# Patient Record
Sex: Female | Born: 1935 | Race: Black or African American | Hispanic: No | Smoking: Never smoker
Health system: Southern US, Community
[De-identification: ages and names within clinical notes are randomized; demographics above are authoritative.]

## PROBLEM LIST (undated history)

## (undated) DIAGNOSIS — R079 Chest pain, unspecified: Secondary | ICD-10-CM

## (undated) DIAGNOSIS — R0609 Other forms of dyspnea: Secondary | ICD-10-CM

## (undated) DIAGNOSIS — G459 Transient cerebral ischemic attack, unspecified: Secondary | ICD-10-CM

## (undated) DIAGNOSIS — I10 Essential (primary) hypertension: Secondary | ICD-10-CM

## (undated) DIAGNOSIS — Z8639 Personal history of other endocrine, nutritional and metabolic disease: Secondary | ICD-10-CM

## (undated) DIAGNOSIS — E119 Type 2 diabetes mellitus without complications: Secondary | ICD-10-CM

## (undated) DIAGNOSIS — K219 Gastro-esophageal reflux disease without esophagitis: Secondary | ICD-10-CM

## (undated) DIAGNOSIS — E7800 Pure hypercholesterolemia, unspecified: Secondary | ICD-10-CM

## (undated) HISTORY — PX: EYE SURGERY: SHX253

## (undated) HISTORY — DX: Pure hypercholesterolemia, unspecified: E78.00

## (undated) HISTORY — PX: FOOT SURGERY: SHX648

## (undated) HISTORY — DX: Personal history of other endocrine, nutritional and metabolic disease: Z86.39

---

## 2004-07-13 ENCOUNTER — Emergency Department (HOSPITAL_COMMUNITY): Admission: EM | Admit: 2004-07-13 | Discharge: 2004-07-13 | Payer: Self-pay | Admitting: Family Medicine

## 2013-12-06 ENCOUNTER — Emergency Department (HOSPITAL_COMMUNITY): Payer: Medicare Other

## 2013-12-06 ENCOUNTER — Encounter (HOSPITAL_COMMUNITY): Payer: Self-pay | Admitting: Emergency Medicine

## 2013-12-06 ENCOUNTER — Observation Stay (HOSPITAL_COMMUNITY): Payer: Medicare Other

## 2013-12-06 ENCOUNTER — Observation Stay (HOSPITAL_COMMUNITY)
Admission: EM | Admit: 2013-12-06 | Discharge: 2013-12-06 | Disposition: A | Discharge status: Home / Self Care | Payer: Medicare Other | Attending: Internal Medicine | Admitting: Internal Medicine

## 2013-12-06 DIAGNOSIS — I519 Heart disease, unspecified: Secondary | ICD-10-CM

## 2013-12-06 DIAGNOSIS — R0600 Dyspnea, unspecified: Secondary | ICD-10-CM

## 2013-12-06 DIAGNOSIS — R079 Chest pain, unspecified: Secondary | ICD-10-CM

## 2013-12-06 DIAGNOSIS — K219 Gastro-esophageal reflux disease without esophagitis: Secondary | ICD-10-CM | POA: Diagnosis not present

## 2013-12-06 DIAGNOSIS — I10 Essential (primary) hypertension: Secondary | ICD-10-CM | POA: Diagnosis not present

## 2013-12-06 DIAGNOSIS — R0789 Other chest pain: Secondary | ICD-10-CM | POA: Diagnosis not present

## 2013-12-06 DIAGNOSIS — E119 Type 2 diabetes mellitus without complications: Secondary | ICD-10-CM | POA: Diagnosis not present

## 2013-12-06 DIAGNOSIS — R110 Nausea: Secondary | ICD-10-CM | POA: Insufficient documentation

## 2013-12-06 DIAGNOSIS — Z8673 Personal history of transient ischemic attack (TIA), and cerebral infarction without residual deficits: Secondary | ICD-10-CM | POA: Insufficient documentation

## 2013-12-06 DIAGNOSIS — R072 Precordial pain: Secondary | ICD-10-CM | POA: Diagnosis present

## 2013-12-06 DIAGNOSIS — R0609 Other forms of dyspnea: Secondary | ICD-10-CM | POA: Diagnosis present

## 2013-12-06 HISTORY — DX: Chest pain, unspecified: R07.9

## 2013-12-06 HISTORY — DX: Other forms of dyspnea: R06.09

## 2013-12-06 HISTORY — DX: Type 2 diabetes mellitus without complications: E11.9

## 2013-12-06 HISTORY — DX: Gastro-esophageal reflux disease without esophagitis: K21.9

## 2013-12-06 HISTORY — DX: Transient cerebral ischemic attack, unspecified: G45.9

## 2013-12-06 HISTORY — DX: Essential (primary) hypertension: I10

## 2013-12-06 HISTORY — DX: Dyspnea, unspecified: R06.00

## 2013-12-06 LAB — CBC
HCT: 33.2 % — ABNORMAL LOW (ref 36.0–46.0)
HCT: 37.5 % (ref 36.0–46.0)
Hemoglobin: 10.7 g/dL — ABNORMAL LOW (ref 12.0–15.0)
Hemoglobin: 12.1 g/dL (ref 12.0–15.0)
MCH: 27.7 pg (ref 26.0–34.0)
MCH: 27.9 pg (ref 26.0–34.0)
MCHC: 32.2 g/dL (ref 30.0–36.0)
MCHC: 32.3 g/dL (ref 30.0–36.0)
MCV: 86 fL (ref 78.0–100.0)
MCV: 86.6 fL (ref 78.0–100.0)
PLATELETS: 248 10*3/uL (ref 150–400)
Platelets: 253 K/uL (ref 150–400)
RBC: 3.86 MIL/uL — ABNORMAL LOW (ref 3.87–5.11)
RBC: 4.33 MIL/uL (ref 3.87–5.11)
RDW: 14.8 % (ref 11.5–15.5)
RDW: 14.9 % (ref 11.5–15.5)
WBC: 6.3 10*3/uL (ref 4.0–10.5)
WBC: 5.4 K/uL (ref 4.0–10.5)

## 2013-12-06 LAB — TROPONIN I
Troponin I: <0.3 ng/mL (ref <0.30)
Troponin I: <0.3 ng/mL
Troponin I: <0.3 ng/mL
Troponin I: <0.3 ng/mL (ref <0.30)

## 2013-12-06 LAB — LIPID PANEL
Cholesterol: 154 mg/dL (ref 0–200)
HDL: 45 mg/dL
LDL Cholesterol: 79 mg/dL (ref 0–99)
Total CHOL/HDL Ratio: 3.4 ratio
Triglycerides: 148 mg/dL
VLDL: 30 mg/dL (ref 0–40)

## 2013-12-06 LAB — BASIC METABOLIC PANEL
ANION GAP: 12 (ref 5–15)
BUN: 20 mg/dL (ref 6–23)
CALCIUM: 9.3 mg/dL (ref 8.4–10.5)
CO2: 25 mEq/L (ref 19–32)
CREATININE: 1.05 mg/dL (ref 0.50–1.10)
Chloride: 106 mEq/L (ref 96–112)
GFR calc Af Amer: 57 mL/min — ABNORMAL LOW (ref >90)
GFR calc non Af Amer: 50 mL/min — ABNORMAL LOW (ref >90)
Glucose, Bld: 96 mg/dL (ref 70–99)
Potassium: 4.1 mEq/L (ref 3.7–5.3)
SODIUM: 143 meq/L (ref 137–147)

## 2013-12-06 LAB — CREATININE, SERUM
Creatinine, Ser: 0.92 mg/dL (ref 0.50–1.10)
GFR calc non Af Amer: 58 mL/min — ABNORMAL LOW (ref >90)
GFR, EST AFRICAN AMERICAN: 67 mL/min — AB (ref >90)

## 2013-12-06 LAB — I-STAT TROPONIN, ED: Troponin i, poc: 0 ng/mL (ref 0.00–0.08)

## 2013-12-06 LAB — HEMOGLOBIN A1C
Hgb A1c MFr Bld: 5.8 % — ABNORMAL HIGH (ref <5.7)
Mean Plasma Glucose: 120 mg/dL — ABNORMAL HIGH (ref <117)

## 2013-12-06 LAB — PRO B NATRIURETIC PEPTIDE
PRO B NATRI PEPTIDE: 55.5 pg/mL (ref 0–450)
Pro B Natriuretic peptide (BNP): 46 pg/mL (ref 0–450)

## 2013-12-06 MED ORDER — ASPIRIN 325 MG PO TABS
325.0000 mg | ORAL_TABLET | ORAL | Status: AC
  Administered 2013-12-06: 325 mg via ORAL
  Filled 2013-12-06: qty 1

## 2013-12-06 MED ORDER — TECHNETIUM TC 99M SESTAMIBI GENERIC - CARDIOLITE
30.0000 | Freq: Once | INTRAVENOUS | Status: AC | PRN
  Administered 2013-12-06: 30 via INTRAVENOUS

## 2013-12-06 MED ORDER — TECHNETIUM TC 99M SESTAMIBI GENERIC - CARDIOLITE
10.0000 | Freq: Once | INTRAVENOUS | Status: AC | PRN
  Administered 2013-12-06: 10 via INTRAVENOUS

## 2013-12-06 MED ORDER — TRIAMTERENE-HCTZ 50-25 MG PO CAPS: 1.0000 | ORAL_CAPSULE | ORAL | Status: DC

## 2013-12-06 MED ORDER — ASPIRIN EC 81 MG PO TBEC
81.0000 mg | DELAYED_RELEASE_TABLET | Freq: Every day | ORAL | Status: DC
  Administered 2013-12-06: 81 mg via ORAL
  Filled 2013-12-06: qty 1

## 2013-12-06 MED ORDER — OMEPRAZOLE 40 MG PO CPDR: 40.0000 mg | DELAYED_RELEASE_CAPSULE | Freq: Two times a day (BID) | ORAL | Status: DC

## 2013-12-06 MED ORDER — ONDANSETRON HCL 4 MG/2ML IJ SOLN
4.0000 mg | Freq: Once | INTRAMUSCULAR | Status: AC
Start: 2013-12-06 — End: 2013-12-06
  Administered 2013-12-06: 4 mg via INTRAVENOUS
  Filled 2013-12-06: qty 2

## 2013-12-06 MED ORDER — ACETAMINOPHEN 325 MG PO TABS: 650.0000 mg | ORAL_TABLET | ORAL | Status: DC | PRN

## 2013-12-06 MED ORDER — REGADENOSON 0.4 MG/5ML IV SOLN
0.4000 mg | Freq: Once | INTRAVENOUS | Status: AC
  Administered 2013-12-06: 0.4 mg via INTRAVENOUS
  Filled 2013-12-06: qty 5

## 2013-12-06 MED ORDER — NITROGLYCERIN 2 % TD OINT
0.5000 [in_us] | TOPICAL_OINTMENT | TRANSDERMAL | Status: AC
  Administered 2013-12-06: 0.5 [in_us] via TOPICAL
  Filled 2013-12-06: qty 1

## 2013-12-06 MED ORDER — NITROGLYCERIN 0.3 MG SL SUBL: 0.3000 mg | SUBLINGUAL_TABLET | SUBLINGUAL | Status: DC | PRN

## 2013-12-06 MED ORDER — NITROGLYCERIN 0.4 MG SL SUBL
0.4000 mg | SUBLINGUAL_TABLET | SUBLINGUAL | Status: DC | PRN
  Administered 2013-12-06 (×2): 0.4 mg via SUBLINGUAL
  Filled 2013-12-06: qty 1

## 2013-12-06 MED ORDER — METOPROLOL SUCCINATE ER 50 MG PO TB24: 50.0000 mg | ORAL_TABLET | Freq: Every day | ORAL | Status: DC

## 2013-12-06 MED ORDER — HEPARIN SODIUM (PORCINE) 5000 UNIT/ML IJ SOLN
5000.0000 [IU] | Freq: Three times a day (TID) | INTRAMUSCULAR | Status: DC
  Administered 2013-12-06: 5000 [IU] via SUBCUTANEOUS
  Filled 2013-12-06 (×5): qty 1

## 2013-12-06 MED ORDER — SODIUM CHLORIDE 0.9 % IV BOLUS (SEPSIS)
500.0000 mL | INTRAVENOUS | Status: AC
  Administered 2013-12-06: 500 mL via INTRAVENOUS

## 2013-12-06 MED ORDER — ATORVASTATIN CALCIUM 40 MG PO TABS
40.0000 mg | ORAL_TABLET | Freq: Every day | ORAL | Status: DC
  Filled 2013-12-06: qty 1

## 2013-12-06 MED ORDER — BIOTENE DRY MOUTH MT LIQD
15.0000 mL | Freq: Two times a day (BID) | OROMUCOSAL | Status: DC
  Administered 2013-12-06: 15 mL via OROMUCOSAL

## 2013-12-06 MED ORDER — TRIAMTERENE 50 MG PO CAPS
50.0000 mg | ORAL_CAPSULE | Freq: Every day | ORAL | Status: DC
  Administered 2013-12-06: 50 mg via ORAL
  Filled 2013-12-06 (×2): qty 1

## 2013-12-06 MED ORDER — ONDANSETRON HCL 4 MG/2ML IJ SOLN: 4.0000 mg | Freq: Four times a day (QID) | INTRAMUSCULAR | Status: DC | PRN

## 2013-12-06 MED ORDER — METOPROLOL SUCCINATE ER 50 MG PO TB24
50.0000 mg | ORAL_TABLET | Freq: Every day | ORAL | Status: DC
  Administered 2013-12-06: 50 mg via ORAL
  Filled 2013-12-06: qty 1

## 2013-12-06 MED ORDER — REGADENOSON 0.4 MG/5ML IV SOLN
INTRAVENOUS | Status: AC
  Administered 2013-12-06: 0.4 mg via INTRAVENOUS
  Filled 2013-12-06: qty 5

## 2013-12-06 MED ORDER — HYDROCHLOROTHIAZIDE 25 MG PO TABS
25.0000 mg | ORAL_TABLET | Freq: Every day | ORAL | Status: DC
  Administered 2013-12-06: 25 mg via ORAL
  Filled 2013-12-06: qty 1

## 2013-12-06 MED ORDER — PANTOPRAZOLE SODIUM 40 MG PO TBEC
40.0000 mg | DELAYED_RELEASE_TABLET | Freq: Every day | ORAL | Status: DC
  Administered 2013-12-06: 40 mg via ORAL
  Filled 2013-12-06: qty 1

## 2013-12-06 MED ORDER — SODIUM CHLORIDE 0.9 % IJ SOLN
80.0000 mg | INTRAVENOUS | Status: AC
  Administered 2013-12-06: 80 mg via INTRAVENOUS

## 2013-12-06 NOTE — Progress Notes (Signed)
Pt is ready for DC home with daughter. Pt reports she understands changes to medications and follow up appointments. Pt reports having no questions about hospital stay.   Jacqueline Foley, Therapist, sports

## 2013-12-06 NOTE — ED Provider Notes (Signed)
CSN: [id number]     Arrival date & time 12/06/13  0020 History   First MD Initiated Contact with Patient 12/06/13 0056     Chief Complaint  Patient presents with  . Chest Pain     (Consider location/radiation/quality/duration/timing/severity/associated sxs/prior Treatment) Patient is a 80 y.o. female presenting with chest pain. The history is provided by the patient.  Chest Pain Pain location:  L chest Pain quality: pressure   Pain radiates to:  L shoulder Pain radiates to the back: no   Pain severity:  Mild Onset quality:  Sudden Duration:  2 hours Timing:  Intermittent Progression:  Unchanged Chronicity:  Recurrent Context comment:  After putting up clothes Relieved by:  Nitroglycerin Worsened by:  Nothing tried Ineffective treatments:  None tried Associated symptoms: nausea   Associated symptoms: no abdominal pain, no back pain, no cough, no dizziness, no fatigue, no fever, no headache, no shortness of breath and not vomiting     Past Medical History  Diagnosis Date  . Hypertension   . Diabetes mellitus without complication   . GERD (gastroesophageal reflux disease)   . TIA (transient ischemic attack)    Past Surgical History  Procedure Laterality Date  . Foot surgery     No family history on file. History  Substance Use Topics  . Smoking status: Never Smoker   . Smokeless tobacco: Not on file  . Alcohol Use: No   OB History   Grav Para Term Preterm Abortions TAB SAB Ect Mult Living                 Review of Systems  Constitutional: Negative for fever and fatigue.  HENT: Negative for congestion and drooling.   Eyes: Negative for pain.  Respiratory: Negative for cough and shortness of breath.   Cardiovascular: Positive for chest pain.  Gastrointestinal: Positive for nausea. Negative for vomiting, abdominal pain and diarrhea.  Genitourinary: Negative for dysuria and hematuria.  Musculoskeletal: Negative for back pain, gait problem and neck pain.  Skin:  Negative for color change.  Neurological: Negative for dizziness and headaches.  Hematological: Negative for adenopathy.  Psychiatric/Behavioral: Negative for behavioral problems.  All other systems reviewed and are negative.     Allergies  Review of patient's allergies indicates not on file.  Home Medications   Prior to Admission medications   Not on File   BP 172/99  Temp(Src) 98.1 F (36.7 C) (Oral)  Resp 15  SpO2 98% Physical Exam  Nursing note and vitals reviewed. Constitutional: She is oriented to person, place, and time. She appears well-developed and well-nourished.  HENT:  Head: Normocephalic and atraumatic.  Mouth/Throat: Oropharynx is clear and moist. No oropharyngeal exudate.  Eyes: Conjunctivae and EOM are normal. Pupils are equal, round, and reactive to light.  Neck: Normal range of motion. Neck supple.  Cardiovascular: Normal rate, regular rhythm, normal heart sounds and intact distal pulses.  Exam reveals no gallop and no friction rub.   No murmur heard. Pulmonary/Chest: Effort normal and breath sounds normal. No respiratory distress. She has no wheezes.  Abdominal: Soft. Bowel sounds are normal. There is no tenderness. There is no rebound and no guarding.  Musculoskeletal: Normal range of motion. She exhibits no edema and no tenderness.  Neurological: She is alert and oriented to person, place, and time.  Skin: Skin is warm and dry.  Psychiatric: She has a normal mood and affect. Her behavior is normal.    ED Course  Procedures (including critical care time) Labs  Review Labs Reviewed  CBC - Abnormal; Notable for the following:    RBC 3.86 (*)    Hemoglobin 10.7 (*)    HCT 33.2 (*)    All other components within normal limits  BASIC METABOLIC PANEL - Abnormal; Notable for the following:    GFR calc non Af Amer 50 (*)    GFR calc Af Amer 57 (*)    All other components within normal limits  HEMOGLOBIN A1C - Abnormal; Notable for the following:     Hemoglobin A1C 5.8 (*)    Mean Plasma Glucose 120 (*)    All other components within normal limits  CREATININE, SERUM - Abnormal; Notable for the following:    GFR calc non Af Amer 58 (*)    GFR calc Af Amer 67 (*)    All other components within normal limits  PRO B NATRIURETIC PEPTIDE  TROPONIN I  PRO B NATRIURETIC PEPTIDE  TROPONIN I  TROPONIN I  LIPID PANEL  CBC  TROPONIN I  I-STAT TROPOININ, ED    Imaging Review Dg Chest 2 View  12/06/2013   CLINICAL DATA:  Chest pain today.  EXAM: CHEST  2 VIEW  COMPARISON:  None.  FINDINGS: The heart size and mediastinal contours are within normal limits. Both lungs are clear. The visualized skeletal structures are unremarkable.  IMPRESSION: No active cardiopulmonary disease.   Electronically Signed   By: Lucienne Capers M.D.   On: 12/06/2013 01:41   Nm Myocar Multi W/spect W/wall Motion / Ef  12/06/2013   CLINICAL DATA:  80 year old with chest pain.  EXAM: MYOCARDIAL IMAGING WITH SPECT (REST AND PHARMACOLOGIC-STRESS)  GATED LEFT VENTRICULAR WALL MOTION STUDY  LEFT VENTRICULAR EJECTION FRACTION  TECHNIQUE: Standard myocardial SPECT imaging performed after resting intravenous injection of 10 mCi Tc-34m sestimibi. Subsequently, intravenous infusion of regadenoson performed under the supervision of the Cardiology staff. At peak effect of the drug, 30 mCi Tc-37m sestimibi injected intravenously and standard myocardial SPECT imaging performed. Quantitative gated imaging also performed to evaluate left ventricular wall motion and estimate left ventricular ejection fraction.  FINDINGS: MYOCARDIAL IMAGING WITH SPECT (REST AND PHARMACOLOGIC-STRESS)  The myocardial perfusion is normal on the stress images. There is no evidence for a fixed or reversible defect.  GATED LEFT VENTRICULAR WALL MOTION STUDY  Review of the gated images demonstrates normal wall motion.  LEFT VENTRICULAR EJECTION FRACTION  QGS ejection fraction measures 66% , with an end-diastolic volume of  48 ml and an end-systolic volume of 16 ml.  IMPRESSION: Normal myocardial perfusion examination.  No evidence for ischemia.  Calculated ejection fraction is 66%.  Normal wall motion.   Electronically Signed   By: Markus Daft M.D.   On: 12/06/2013 17:30     EKG Interpretation   Date/Time:  Thursday December 06 2013 00:38:01 EDT Ventricular Rate:  86 PR Interval:  142 QRS Duration: 96 QT Interval:  381 QTC Calculation: 456 R Axis:   -21 Text Interpretation:  Sinus rhythm Borderline left axis deviation no  previous for comparison Confirmed by Brookelyn Gaynor  MD, Marika Mahaffy ([id number]) on  12/06/2013 12:44:09 AM      MDM   Final diagnoses:  Other chest pain    1:20 AM 80 y.o. female with a history of hypertension, hypokalemia, diabetes, family history of heart disease who presents with chest pain which began at approximately 11 PM this evening after she was putting up some close. She notes the pain is in her left shoulder and has been intermittent since  that time. She describes it as a pressure. She took one nitroglycerin at home with mild to moderate relief. She has had intermittent pain since that time. She is now reporting some mild pain on exam. She is afebrile and vital signs are unremarkable here. Will get labs and imaging. ASA and nitro given.  Labs/imaging thus far non-contrib. Will consult cards d/t ongoing intermittent and worsening cp.     Blanchard Kelch, MD 12/06/13 2009

## 2013-12-06 NOTE — H&P (Signed)
Cardiology H&P Note  Patient ID: Jacqueline Foley, MRN: [id number], DOB/AGE: 1933-02-10 80 y.o. Admit date: 12/06/2013   Date of Consult: 12/06/2013 Primary Physician: No PCP Per Patient Primary Cardiologist: nill   Chief Complaint: chest pain     Assessment and Plan:  Ass  Pre-Chest pain - atypical  Dyspnea on exertion HTN  DM - diet controlled    Plan  Monitor on tele  Trend CE , if negative would consider stress test and echocardiogram  Check Hg A1c ,  Cont aspirin , statin , metoprolol and nitro paste     80 yr old female from [place] with hx of HTN , borderline DM , TIA here with chest pain   HPI: pt is visiting her grandchildren in [place] from [place] since April . 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 Pt states that yesterday when returned from a beach trip and was carrying items into the house she became SOB. She also noticed left sided chest pressure and left shoulder pain that is worse with palpation and lying down on her left arm . Pt states that she saw a cardiologist in [place] last year for this and a stress test done with decision to treat her medically with nitrates.  She denies any orthopnea, PND , LE edema ,  focal weakness, syncope, bleeding diathesis , claudication , palpitation etc .  Reports medication compliance  Past Medical History  Diagnosis Date  . Hypertension   . Diabetes mellitus without complication   . GERD (gastroesophageal reflux disease)   . TIA (transient ischemic attack)       Most Recent Cardiac Studies: 12/06/2013 EKG NSR    Surgical History:  Past Surgical History  Procedure Laterality Date  . Foot surgery       Home Meds: Prior to Admission medications   Medication Sig Start Date End Date Taking? Authorizing Provider  aspirin EC 81 MG tablet Take 81 mg by mouth daily.   Yes Historical Provider, MD  atorvastatin (LIPITOR) 40 MG tablet Take 40 mg by mouth at  bedtime.   Yes Historical Provider, MD  diclofenac sodium (VOLTAREN) 1 % GEL Apply 2 g topically 3 (three) times daily.   Yes Historical Provider, MD  ibuprofen (ADVIL,MOTRIN) 200 MG tablet Take 400 mg by mouth daily as needed for moderate pain.   Yes Historical Provider, MD  metoprolol succinate (TOPROL-XL) 50 MG 24 hr tablet Take 50 mg by mouth daily. Take with or immediately following a meal.   Yes Historical Provider, MD  nitroGLYCERIN (NITROSTAT) 0.3 MG SL tablet Place 0.3 mg under the tongue every 5 (five) minutes as needed for chest pain.   Yes Historical Provider, MD  omeprazole (PRILOSEC) 40 MG capsule Take 40 mg by mouth daily.   Yes Historical Provider, MD  triamterene-hydrochlorothiazide (DYAZIDE) 50-25 MG per capsule Take 1 capsule by mouth every morning.   Yes Historical Provider, MD    Inpatient Medications:       Allergies: Not on File  History   Social History  . Marital Status: Divorced    Spouse Name: N/A    Number of Children: N/A  . Years of Education: N/A   Occupational History  . Not on file.   Social History Main Topics  . Smoking status: Never Smoker   . Smokeless tobacco: Not on file  . Alcohol Use: No  . Drug  Use: No  . Sexual Activity: Not on file   Other Topics Concern  . Not on file   Social History Narrative  . No narrative on file     No family history on file.   Review of Systems: General: negative for chills, fever, night sweats or weight changes.  Cardiovascular: per HPI  Dermatological: negative for rash Respiratory: negative for cough or wheezing Urologic: negative for hematuria Abdominal: negative for nausea, vomiting, diarrhea, bright red blood per rectum, melena, or hematemesis Neurologic: negative for visual changes, syncope, or dizziness All other systems reviewed and are otherwise negative except as noted above.  Labs: No results found for this basename: CKTOTAL, CKMB, TROPONINI,  in the last 72 hours Lab Results   Component Value Date   WBC 6.3 12/06/2013   HGB 10.7* 12/06/2013   HCT 33.2* 12/06/2013   MCV 86.0 12/06/2013   PLT 248 12/06/2013    Recent Labs Lab 12/06/13 0045  NA 143  K 4.1  CL 106  CO2 25  BUN 20  CREATININE 1.05  CALCIUM 9.3  GLUCOSE 96   No results found for this basename: CHOL, HDL, LDLCALC, TRIG   No results found for this basename: DDIMER    Radiology/Studies:  Dg Chest 2 View  12/06/2013   CLINICAL DATA:  Chest pain today.  EXAM: CHEST  2 VIEW  COMPARISON:  None.  FINDINGS: The heart size and mediastinal contours are within normal limits. Both lungs are clear. The visualized skeletal structures are unremarkable.  IMPRESSION: No active cardiopulmonary disease.   Electronically Signed   By: Lucienne Capers M.D.   On: 12/06/2013 01:41      Physical Exam: Blood pressure 147/89, pulse 83, temperature 98.1 F (36.7 C), temperature source Oral, resp. rate 18, SpO2 99.00%. General: Well developed, well nourished, in no acute distress.  Neck: Negative for carotid bruits. JVD not elevated. Lungs: Clear bilaterally to auscultation without wheezes, rales, or rhonchi. Breathing is unlabored. Heart: RRR with S1 S2. 2/ 6 systolic murmur at the base of the heart  Abdomen: Soft, non-tender, non-distended with normoactive bowel sounds. No hepatomegaly. No rebound/guarding. No obvious abdominal masses. Extremities: No clubbing or cyanosis. No edema.  Radial pulses are 2+ and equal bilaterally. Neuro: Alert and oriented X 3. No facial asymmetry. No focal deficit. Moves all extremities spontaneously. Psych:  Responds to questions appropriately with a normal affect.     Cory Roughen, A M.D  12/06/2013, 4:20 AM

## 2013-12-06 NOTE — ED Notes (Signed)
Jacqueline Foley [place] [phone]. Call when pt discharges

## 2013-12-06 NOTE — ED Notes (Signed)
Attempted to call report x 1  

## 2013-12-06 NOTE — Discharge Instructions (Signed)
We increased your prilosec to twice a day for 2 weeks then go back to once daily.  Your heart tests were normal except better blood pressure control would be good.

## 2013-12-06 NOTE — Progress Notes (Signed)
Lexiscan myoview completed without complications.  nuc results to follow.

## 2013-12-06 NOTE — ED Notes (Signed)
Pt. reports left chest pain with SOB and nausea onset this evening , denies diaphoresis , pt. took 1 NTG sl with relief.

## 2013-12-06 NOTE — Progress Notes (Signed)
UR Completed Jamilet Ambroise Graves-Bigelow, RN,BSN [phone]  

## 2013-12-06 NOTE — Discharge Summary (Signed)
Physician Discharge Summary       Patient ID: Jacqueline Foley MRN: [id number] DOB/AGE: Jan 06, 1933 80 y.o.  Admit date: 12/06/2013 Discharge date: 12/06/2013  Discharge Diagnoses:  Principal Problem:   Chest pain at rest, negative MI, negative stress test, may have been GI Active Problems:   Precordial chest pain   HTN (hypertension)   DOE (dyspnea on exertion), may be due to HTN   Discharged Condition: good  Procedures: none  Hospital Course: 80 year old female with hx of HTN , borderline DM , TIA here presented to [facility] with chest pain 12/05/13.  Pt is visiting her grandchildren in [place] from [place] since April . 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 Pt states that 12/05/13 when returned from a beach trip and was carrying items into the house she became SOB. She also noticed left sided chest pressure and left shoulder pain that is worse with palpation and lying down on her left arm . Pt states that she saw a cardiologist in [place] last year for this and a stress test done with decision to treat her medically with nitrates.    She was admitted and troponins are neg X 3.  Negative stress test and echo with grade 1 diastolic dysfunction.  She has had no further complaints.  We increased her Prilosec to twice a day for 2 weeks then she will return to once daily.  I re-ordered her BP meds, she was almost out.  We will follow up with her in the office in 2 weeks.  She will call if any problems before that time. Lipids were WNL and HgBA1c was 5.8.   Consults: None  Significant Diagnostic Studies: troponin <0.30 X 3,  Lipid Panel     Component Value Date/Time   CHOL 154 12/06/2013 0740   TRIG 148 12/06/2013 0740   HDL 45 12/06/2013 0740   CHOLHDL 3.4 12/06/2013 0740   VLDL 30 12/06/2013 0740   LDLCALC 79 12/06/2013 0740   BMET    Component Value Date/Time   NA 143 12/06/2013 0045   K 4.1 12/06/2013 0045   CL 106 12/06/2013  0045   CO2 25 12/06/2013 0045   GLUCOSE 96 12/06/2013 0045   BUN 20 12/06/2013 0045   CREATININE 0.92 12/06/2013 0740   CALCIUM 9.3 12/06/2013 0045   GFRNONAA 58* 12/06/2013 0740   GFRAA 67* 12/06/2013 0740    CBC    Component Value Date/Time   WBC 5.4 12/06/2013 0740   RBC 4.33 12/06/2013 0740   HGB 12.1 12/06/2013 0740   HCT 37.5 12/06/2013 0740   PLT 253 12/06/2013 0740   MCV 86.6 12/06/2013 0740   MCH 27.9 12/06/2013 0740   MCHC 32.3 12/06/2013 0740   RDW 14.9 12/06/2013 0740   2D Echo: Left ventricle: The cavity size was normal. Wall thickness was normal. Systolic function was normal. The estimated ejection fraction was in the range of 55% to 60%. Wall motion was normal; there were no regional wall motion abnormalities. There was an increased relative contribution of atrial contraction to ventricular filling. Doppler parameters are consistent with abnormal left ventricular relaxation (grade 1 diastolic dysfunction). - Pulmonary arteries: PA peak pressure: 32 mm Hg (S).  Aortic valve: Mildly thickened leaflets. Cusp separation was normal. Doppler: Transvalvular velocity was within the normal range. There was no stenosis. There was no regurgitation.  ------------------------------------------------------------------- Aorta: Aortic root:  The aortic root was normal in size. Ascending aorta: The ascending aorta was normal in size.  ------------------------------------------------------------------- Mitral valve: Structurally normal valve. Leaflet separation was normal. Doppler: Transvalvular velocity was within the normal range. There was no evidence for stenosis. There was trivial regurgitation. Peak gradient (D): 3 mm Hg.  LEXISCAN Myoview:  LEFT VENTRICULAR EJECTION FRACTION  QGS ejection fraction measures 66% , with an end-diastolic volume of  48 ml and an end-systolic volume of 16 ml.  IMPRESSION:  Normal myocardial perfusion examination. No evidence for ischemia.  Calculated ejection  fraction is 66%. Normal wall motion.   EKG: Sinus rhythm Borderline left axis deviation no previous for comparison  CHEST 2 VIEW  COMPARISON: None.  FINDINGS:  The heart size and mediastinal contours are within normal limits.  Both lungs are clear. The visualized skeletal structures are  unremarkable.  IMPRESSION:  No active cardiopulmonary disease.      Discharge Exam: Blood pressure 124/73, pulse 75, temperature 97.9 F (36.6 C), temperature source Oral, resp. rate 16, height 5\' 4"  (1.626 m), weight 154 lb (69.854 kg), SpO2 100.00%.  Disposition: Home     Medication List         aspirin EC 81 MG tablet  Take 81 mg by mouth daily.     atorvastatin 40 MG tablet  Commonly known as:  LIPITOR  Take 40 mg by mouth at bedtime.     diclofenac sodium 1 % Gel  Commonly known as:  VOLTAREN  Apply 2 g topically 3 (three) times daily.     ibuprofen 200 MG tablet  Commonly known as:  ADVIL,MOTRIN  Take 400 mg by mouth daily as needed for moderate pain.     metoprolol succinate 50 MG 24 hr tablet  Commonly known as:  TOPROL-XL  Take 1 tablet (50 mg total) by mouth daily. Take with or immediately following a meal.     nitroGLYCERIN 0.3 MG SL tablet  Commonly known as:  NITROSTAT  Place 0.3 mg under the tongue every 5 (five) minutes as needed for chest pain.     omeprazole 40 MG capsule  Commonly known as:  PRILOSEC  Take 1 capsule (40 mg total) by mouth 2 (two) times daily.     triamterene-hydrochlorothiazide 50-25 MG per capsule  Commonly known as:  DYAZIDE  Take 1 capsule by mouth every morning.       Follow-up Information   Follow up with Sanda Klein, MD. (with Cecilie Kicks, FNP-C his Nurse Practitioner, our office will call you tomorrow with date and time.)    Specialty:  Cardiology   Contact information:   [address] [place] [address] [phone]        Discharge Instructions: We increased your prilosec to twice a day for 2  weeks then go back to once daily.  Your heart tests were normal except better blood pressure control would be good.    Signed: Isaiah Serge Nurse Practitioner-Certified [facility]: [facility] 12/06/2013, 8:16 PM  Time spent on discharge : >30 minutes.

## 2013-12-06 NOTE — Progress Notes (Signed)
  Echocardiogram 2D Echocardiogram has been performed.  Mauricio Po 12/06/2013, 4:28 PM

## 2013-12-06 NOTE — Progress Notes (Signed)
Continues to have intermittent chest pressure, left sided, at rest, but ECG and cardiac enzymes are low risk. Cannot walk on treadmill due to right sided sciatica. Will schedule Lexiscan Myoview.

## 2013-12-06 NOTE — ED Notes (Signed)
Cards at bedside

## 2013-12-06 NOTE — Progress Notes (Signed)
Discussed allergies with pt as they were not on file prior.  Pt stated tomatoes cause her tongue to swell and she gets a rash, but still eats a slice once in a while.  Pt stated she had no known drug allergies except she stated a BP in her past made her ankles swell and they took her off of it, but she could not recall the name.

## 2013-12-24 ENCOUNTER — Encounter: Payer: Self-pay | Admitting: Cardiology

## 2013-12-24 ENCOUNTER — Ambulatory Visit (INDEPENDENT_AMBULATORY_CARE_PROVIDER_SITE_OTHER): Payer: Medicare Other | Admitting: Cardiology

## 2013-12-24 VITALS — BP 134/90 | HR 88 | Ht 64.0 in | Wt 150.0 lb

## 2013-12-24 DIAGNOSIS — R0609 Other forms of dyspnea: Secondary | ICD-10-CM

## 2013-12-24 DIAGNOSIS — I10 Essential (primary) hypertension: Secondary | ICD-10-CM

## 2013-12-24 DIAGNOSIS — R079 Chest pain, unspecified: Secondary | ICD-10-CM

## 2013-12-24 DIAGNOSIS — R0989 Other specified symptoms and signs involving the circulatory and respiratory systems: Secondary | ICD-10-CM

## 2013-12-24 MED ORDER — ATORVASTATIN CALCIUM 40 MG PO TABS: 40.0000 mg | ORAL_TABLET | Freq: Every day | ORAL | Status: DC

## 2013-12-24 NOTE — Assessment & Plan Note (Signed)
improved

## 2013-12-24 NOTE — Assessment & Plan Note (Signed)
Stable since discharge  

## 2013-12-24 NOTE — Patient Instructions (Signed)
Your physician recommends that you schedule a follow-up appointment as needed  

## 2013-12-24 NOTE — Progress Notes (Signed)
12/24/2013   PCP: No PCP Per Patient   Chief Complaint  Patient presents with  . Appointment    weakness/ pt concerned about her shoulder area on left side    Primary Cardiologist:Dr. Bertrum Sol   HPI:  80 year old female, presents for f/u from hospital.  She was admitted with SOB and Lt sided chest pain.  Negative for MI, negative stress test, Echo was stable.  She was discharged home.  She has no problems today.  She stated she was on lt side last night and she has a sensation today.  No other complaints and she is feeling better. She needs refill on lipitor. We had increased her PPI for 2 weeks then she will go back to once a day.  She is from [place] and plans to go back.  We have refilled her meds to cover her until her return.   Allergies  Allergen Reactions  . Tomato Swelling and Rash    Current Outpatient Prescriptions  Medication Sig Dispense Refill  . aspirin EC 81 MG tablet Take 81 mg by mouth daily.      Marland Kitchen atorvastatin (LIPITOR) 40 MG tablet Take 1 tablet (40 mg total) by mouth at bedtime.  30 tablet  5  . diclofenac sodium (VOLTAREN) 1 % GEL Apply 2 g topically 3 (three) times daily.      Marland Kitchen ibuprofen (ADVIL,MOTRIN) 200 MG tablet Take 400 mg by mouth daily as needed for moderate pain.      . metoprolol succinate (TOPROL-XL) 50 MG 24 hr tablet Take 1 tablet (50 mg total) by mouth daily. Take with or immediately following a meal.  30 tablet  6  . nitroGLYCERIN (NITROSTAT) 0.3 MG SL tablet Place 0.3 mg under the tongue every 5 (five) minutes as needed for chest pain.      Marland Kitchen omeprazole (PRILOSEC) 40 MG capsule Take 1 capsule (40 mg total) by mouth 2 (two) times daily.  60 capsule  1  . triamterene-hydrochlorothiazide (DYAZIDE) 50-25 MG per capsule Take 1 capsule by mouth every morning.  30 capsule  6   No current facility-administered medications for this visit.    Past Medical History  Diagnosis Date  . Hypertension   . Diabetes mellitus without  complication   . GERD (gastroesophageal reflux disease)   . TIA (transient ischemic attack)   . Chest pain at rest, negative MI, negative stress test, may have been GI 12/06/2013  . HTN (hypertension) 12/06/2013  . DOE (dyspnea on exertion), may be due to HTN 12/06/2013    Past Surgical History  Procedure Laterality Date  . Foot surgery      FBP:ZWCHENI:DP colds or fevers, no weight changes CV:see HPI PUL:see HPI GI:no diarrhea constipation or melena, no indigestion GU:no hematuria, no dysuria Neuro:no syncope, no lightheadedness   Wt Readings from Last 3 Encounters:  12/24/13 150 lb (68.04 kg)  12/06/13 154 lb (69.854 kg)    PHYSICAL EXAM BP 134/90  Pulse 88  Ht 5\' 4"  (1.626 m)  Wt 150 lb (68.04 kg)  BMI 25.73 kg/m2 General:Pleasant affect, NAD Skin:Warm and dry, brisk capillary refill HEENT:normocephalic, sclera clear, mucus membranes moist Neck:supple, no JVD, no bruits  Heart:S1S2 RRR without murmur, gallup, rub or click Lungs:clear without rales, rhonchi, or wheezes OEU:MPNT, non tender, + BS, do not palpate liver spleen or masses Ext:no lower ext edema, 2+ pedal pulses, 2+ radial pulses Neuro:alert and oriented, MAE, follows commands, + facial  symmetry  EKG:SR no acute changes from hospital  ASSESSMENT AND PLAN Chest pain at rest, negative MI, negative stress test, may have been GI Stable since discharge  DOE (dyspnea on exertion), may be due to HTN improved  HTN (hypertension) improved   Follow up prn

## 2014-06-28 DIAGNOSIS — M25559 Pain in unspecified hip: Secondary | ICD-10-CM | POA: Diagnosis not present

## 2014-06-28 DIAGNOSIS — K219 Gastro-esophageal reflux disease without esophagitis: Secondary | ICD-10-CM | POA: Diagnosis not present

## 2014-06-28 DIAGNOSIS — J019 Acute sinusitis, unspecified: Secondary | ICD-10-CM | POA: Diagnosis not present

## 2014-07-29 DIAGNOSIS — E785 Hyperlipidemia, unspecified: Secondary | ICD-10-CM | POA: Diagnosis not present

## 2014-07-29 DIAGNOSIS — M25572 Pain in left ankle and joints of left foot: Secondary | ICD-10-CM | POA: Diagnosis not present

## 2014-07-29 DIAGNOSIS — H18413 Arcus senilis, bilateral: Secondary | ICD-10-CM | POA: Diagnosis not present

## 2014-07-29 DIAGNOSIS — M25372 Other instability, left ankle: Secondary | ICD-10-CM | POA: Diagnosis not present

## 2014-07-29 DIAGNOSIS — I10 Essential (primary) hypertension: Secondary | ICD-10-CM | POA: Diagnosis not present

## 2014-07-29 DIAGNOSIS — D649 Anemia, unspecified: Secondary | ICD-10-CM | POA: Diagnosis not present

## 2014-07-29 DIAGNOSIS — H9190 Unspecified hearing loss, unspecified ear: Secondary | ICD-10-CM | POA: Diagnosis not present

## 2014-08-01 DIAGNOSIS — M19072 Primary osteoarthritis, left ankle and foot: Secondary | ICD-10-CM | POA: Diagnosis not present

## 2014-08-01 DIAGNOSIS — M25572 Pain in left ankle and joints of left foot: Secondary | ICD-10-CM | POA: Diagnosis not present

## 2014-08-06 DIAGNOSIS — M6281 Muscle weakness (generalized): Secondary | ICD-10-CM | POA: Diagnosis not present

## 2014-08-06 DIAGNOSIS — Z981 Arthrodesis status: Secondary | ICD-10-CM | POA: Diagnosis not present

## 2014-08-12 DIAGNOSIS — D649 Anemia, unspecified: Secondary | ICD-10-CM | POA: Diagnosis not present

## 2014-08-12 DIAGNOSIS — K219 Gastro-esophageal reflux disease without esophagitis: Secondary | ICD-10-CM | POA: Diagnosis not present

## 2014-08-12 DIAGNOSIS — R1310 Dysphagia, unspecified: Secondary | ICD-10-CM | POA: Diagnosis not present

## 2014-08-13 DIAGNOSIS — G14 Postpolio syndrome: Secondary | ICD-10-CM | POA: Diagnosis not present

## 2014-08-28 DIAGNOSIS — D649 Anemia, unspecified: Secondary | ICD-10-CM | POA: Diagnosis not present

## 2014-08-28 DIAGNOSIS — M810 Age-related osteoporosis without current pathological fracture: Secondary | ICD-10-CM | POA: Diagnosis not present

## 2014-08-28 DIAGNOSIS — E559 Vitamin D deficiency, unspecified: Secondary | ICD-10-CM | POA: Diagnosis not present

## 2014-08-28 DIAGNOSIS — J309 Allergic rhinitis, unspecified: Secondary | ICD-10-CM | POA: Diagnosis not present

## 2014-08-28 DIAGNOSIS — M545 Low back pain: Secondary | ICD-10-CM | POA: Diagnosis not present

## 2014-08-28 DIAGNOSIS — R1310 Dysphagia, unspecified: Secondary | ICD-10-CM | POA: Diagnosis not present

## 2014-08-28 DIAGNOSIS — Z0000 Encounter for general adult medical examination without abnormal findings: Secondary | ICD-10-CM | POA: Diagnosis not present

## 2014-08-28 DIAGNOSIS — I10 Essential (primary) hypertension: Secondary | ICD-10-CM | POA: Diagnosis not present

## 2014-08-28 DIAGNOSIS — J45909 Unspecified asthma, uncomplicated: Secondary | ICD-10-CM | POA: Diagnosis not present

## 2014-08-29 DIAGNOSIS — D508 Other iron deficiency anemias: Secondary | ICD-10-CM | POA: Diagnosis not present

## 2014-08-29 DIAGNOSIS — K222 Esophageal obstruction: Secondary | ICD-10-CM | POA: Diagnosis not present

## 2014-08-29 DIAGNOSIS — N183 Chronic kidney disease, stage 3 (moderate): Secondary | ICD-10-CM | POA: Diagnosis not present

## 2014-08-29 DIAGNOSIS — I129 Hypertensive chronic kidney disease with stage 1 through stage 4 chronic kidney disease, or unspecified chronic kidney disease: Secondary | ICD-10-CM | POA: Diagnosis not present

## 2014-08-29 DIAGNOSIS — R12 Heartburn: Secondary | ICD-10-CM | POA: Diagnosis not present

## 2014-08-29 DIAGNOSIS — E785 Hyperlipidemia, unspecified: Secondary | ICD-10-CM | POA: Diagnosis not present

## 2014-08-29 DIAGNOSIS — J45909 Unspecified asthma, uncomplicated: Secondary | ICD-10-CM | POA: Diagnosis not present

## 2014-08-29 DIAGNOSIS — K449 Diaphragmatic hernia without obstruction or gangrene: Secondary | ICD-10-CM | POA: Diagnosis not present

## 2014-08-29 DIAGNOSIS — K219 Gastro-esophageal reflux disease without esophagitis: Secondary | ICD-10-CM | POA: Diagnosis not present

## 2014-08-29 DIAGNOSIS — R1310 Dysphagia, unspecified: Secondary | ICD-10-CM | POA: Diagnosis not present

## 2014-08-29 DIAGNOSIS — R7309 Other abnormal glucose: Secondary | ICD-10-CM | POA: Diagnosis not present

## 2014-08-29 DIAGNOSIS — D509 Iron deficiency anemia, unspecified: Secondary | ICD-10-CM | POA: Diagnosis not present

## 2014-08-29 DIAGNOSIS — I10 Essential (primary) hypertension: Secondary | ICD-10-CM | POA: Diagnosis not present

## 2014-09-04 DIAGNOSIS — M5136 Other intervertebral disc degeneration, lumbar region: Secondary | ICD-10-CM | POA: Diagnosis not present

## 2014-09-12 DIAGNOSIS — D649 Anemia, unspecified: Secondary | ICD-10-CM | POA: Diagnosis not present

## 2014-09-12 DIAGNOSIS — I2510 Atherosclerotic heart disease of native coronary artery without angina pectoris: Secondary | ICD-10-CM | POA: Diagnosis not present

## 2014-09-12 DIAGNOSIS — K641 Second degree hemorrhoids: Secondary | ICD-10-CM | POA: Diagnosis not present

## 2014-09-12 DIAGNOSIS — K635 Polyp of colon: Secondary | ICD-10-CM | POA: Diagnosis not present

## 2014-09-12 DIAGNOSIS — I10 Essential (primary) hypertension: Secondary | ICD-10-CM | POA: Diagnosis not present

## 2014-09-12 DIAGNOSIS — D123 Benign neoplasm of transverse colon: Secondary | ICD-10-CM | POA: Diagnosis not present

## 2014-09-12 DIAGNOSIS — Q438 Other specified congenital malformations of intestine: Secondary | ICD-10-CM | POA: Diagnosis not present

## 2014-09-12 DIAGNOSIS — D509 Iron deficiency anemia, unspecified: Secondary | ICD-10-CM | POA: Diagnosis not present

## 2014-09-26 DIAGNOSIS — K449 Diaphragmatic hernia without obstruction or gangrene: Secondary | ICD-10-CM | POA: Diagnosis not present

## 2014-09-26 DIAGNOSIS — K219 Gastro-esophageal reflux disease without esophagitis: Secondary | ICD-10-CM | POA: Diagnosis not present

## 2014-09-26 DIAGNOSIS — R1310 Dysphagia, unspecified: Secondary | ICD-10-CM | POA: Diagnosis not present

## 2014-10-17 DIAGNOSIS — E785 Hyperlipidemia, unspecified: Secondary | ICD-10-CM | POA: Diagnosis not present

## 2014-10-17 DIAGNOSIS — E559 Vitamin D deficiency, unspecified: Secondary | ICD-10-CM | POA: Diagnosis not present

## 2014-10-17 DIAGNOSIS — G14 Postpolio syndrome: Secondary | ICD-10-CM | POA: Diagnosis not present

## 2014-10-17 DIAGNOSIS — N183 Chronic kidney disease, stage 3 (moderate): Secondary | ICD-10-CM | POA: Diagnosis not present

## 2014-10-17 DIAGNOSIS — D649 Anemia, unspecified: Secondary | ICD-10-CM | POA: Diagnosis not present

## 2014-10-17 DIAGNOSIS — I10 Essential (primary) hypertension: Secondary | ICD-10-CM | POA: Diagnosis not present

## 2014-11-12 DIAGNOSIS — G14 Postpolio syndrome: Secondary | ICD-10-CM | POA: Diagnosis not present

## 2014-11-14 DIAGNOSIS — I10 Essential (primary) hypertension: Secondary | ICD-10-CM | POA: Diagnosis not present

## 2014-11-14 DIAGNOSIS — R1310 Dysphagia, unspecified: Secondary | ICD-10-CM | POA: Diagnosis not present

## 2014-11-14 DIAGNOSIS — D509 Iron deficiency anemia, unspecified: Secondary | ICD-10-CM | POA: Diagnosis not present

## 2014-11-14 DIAGNOSIS — D649 Anemia, unspecified: Secondary | ICD-10-CM | POA: Diagnosis not present

## 2014-11-14 DIAGNOSIS — N183 Chronic kidney disease, stage 3 (moderate): Secondary | ICD-10-CM | POA: Diagnosis not present

## 2015-02-10 DIAGNOSIS — H40013 Open angle with borderline findings, low risk, bilateral: Secondary | ICD-10-CM | POA: Diagnosis not present

## 2015-02-10 DIAGNOSIS — H53002 Unspecified amblyopia, left eye: Secondary | ICD-10-CM | POA: Diagnosis not present

## 2015-12-01 ENCOUNTER — Ambulatory Visit
Admission: RE | Admit: 2015-12-01 | Discharge: 2015-12-01 | Disposition: A | Discharge status: Home / Self Care | Payer: Medicare Other | Source: Ambulatory Visit | Attending: Family Medicine | Admitting: Family Medicine

## 2015-12-01 ENCOUNTER — Other Ambulatory Visit: Payer: Self-pay | Admitting: Family Medicine

## 2015-12-01 DIAGNOSIS — R1310 Dysphagia, unspecified: Secondary | ICD-10-CM | POA: Diagnosis not present

## 2015-12-01 DIAGNOSIS — R51 Headache: Secondary | ICD-10-CM | POA: Diagnosis not present

## 2015-12-01 DIAGNOSIS — K219 Gastro-esophageal reflux disease without esophagitis: Secondary | ICD-10-CM | POA: Diagnosis not present

## 2015-12-01 DIAGNOSIS — G8929 Other chronic pain: Secondary | ICD-10-CM

## 2015-12-01 DIAGNOSIS — I10 Essential (primary) hypertension: Secondary | ICD-10-CM | POA: Diagnosis not present

## 2015-12-01 DIAGNOSIS — G14 Postpolio syndrome: Secondary | ICD-10-CM | POA: Diagnosis not present

## 2015-12-01 DIAGNOSIS — E784 Other hyperlipidemia: Secondary | ICD-10-CM | POA: Diagnosis not present

## 2015-12-09 DIAGNOSIS — R1310 Dysphagia, unspecified: Secondary | ICD-10-CM | POA: Diagnosis not present

## 2015-12-09 DIAGNOSIS — K219 Gastro-esophageal reflux disease without esophagitis: Secondary | ICD-10-CM | POA: Diagnosis not present

## 2015-12-09 DIAGNOSIS — G14 Postpolio syndrome: Secondary | ICD-10-CM | POA: Diagnosis not present

## 2015-12-09 DIAGNOSIS — E784 Other hyperlipidemia: Secondary | ICD-10-CM | POA: Diagnosis not present

## 2015-12-09 DIAGNOSIS — R51 Headache: Secondary | ICD-10-CM | POA: Diagnosis not present

## 2015-12-09 DIAGNOSIS — H53002 Unspecified amblyopia, left eye: Secondary | ICD-10-CM | POA: Diagnosis not present

## 2015-12-09 DIAGNOSIS — I10 Essential (primary) hypertension: Secondary | ICD-10-CM | POA: Diagnosis not present

## 2015-12-09 DIAGNOSIS — H25813 Combined forms of age-related cataract, bilateral: Secondary | ICD-10-CM | POA: Diagnosis not present

## 2015-12-09 DIAGNOSIS — H401232 Low-tension glaucoma, bilateral, moderate stage: Secondary | ICD-10-CM | POA: Diagnosis not present

## 2015-12-22 DIAGNOSIS — H268 Other specified cataract: Secondary | ICD-10-CM | POA: Diagnosis not present

## 2015-12-22 DIAGNOSIS — H2512 Age-related nuclear cataract, left eye: Secondary | ICD-10-CM | POA: Diagnosis not present

## 2015-12-22 DIAGNOSIS — H25012 Cortical age-related cataract, left eye: Secondary | ICD-10-CM | POA: Diagnosis not present

## 2016-01-02 DIAGNOSIS — R51 Headache: Secondary | ICD-10-CM | POA: Diagnosis not present

## 2016-01-02 DIAGNOSIS — D649 Anemia, unspecified: Secondary | ICD-10-CM | POA: Diagnosis not present

## 2016-01-02 DIAGNOSIS — R269 Unspecified abnormalities of gait and mobility: Secondary | ICD-10-CM | POA: Diagnosis not present

## 2016-01-02 DIAGNOSIS — I10 Essential (primary) hypertension: Secondary | ICD-10-CM | POA: Diagnosis not present

## 2016-01-02 DIAGNOSIS — G14 Postpolio syndrome: Secondary | ICD-10-CM | POA: Diagnosis not present

## 2016-01-02 DIAGNOSIS — R29898 Other symptoms and signs involving the musculoskeletal system: Secondary | ICD-10-CM | POA: Diagnosis not present

## 2016-01-02 DIAGNOSIS — G4700 Insomnia, unspecified: Secondary | ICD-10-CM | POA: Diagnosis not present

## 2016-01-02 DIAGNOSIS — R1310 Dysphagia, unspecified: Secondary | ICD-10-CM | POA: Diagnosis not present

## 2016-01-02 DIAGNOSIS — F419 Anxiety disorder, unspecified: Secondary | ICD-10-CM | POA: Diagnosis not present

## 2016-01-02 DIAGNOSIS — E784 Other hyperlipidemia: Secondary | ICD-10-CM | POA: Diagnosis not present

## 2016-01-02 DIAGNOSIS — K219 Gastro-esophageal reflux disease without esophagitis: Secondary | ICD-10-CM | POA: Diagnosis not present

## 2016-01-19 DIAGNOSIS — H269 Unspecified cataract: Secondary | ICD-10-CM | POA: Diagnosis not present

## 2016-01-19 DIAGNOSIS — H25011 Cortical age-related cataract, right eye: Secondary | ICD-10-CM | POA: Diagnosis not present

## 2016-01-19 DIAGNOSIS — H2511 Age-related nuclear cataract, right eye: Secondary | ICD-10-CM | POA: Diagnosis not present

## 2016-01-20 DIAGNOSIS — H2511 Age-related nuclear cataract, right eye: Secondary | ICD-10-CM | POA: Diagnosis not present

## 2016-03-02 DIAGNOSIS — R29898 Other symptoms and signs involving the musculoskeletal system: Secondary | ICD-10-CM | POA: Diagnosis not present

## 2016-03-02 DIAGNOSIS — G4700 Insomnia, unspecified: Secondary | ICD-10-CM | POA: Diagnosis not present

## 2016-03-02 DIAGNOSIS — D649 Anemia, unspecified: Secondary | ICD-10-CM | POA: Diagnosis not present

## 2016-03-02 DIAGNOSIS — I10 Essential (primary) hypertension: Secondary | ICD-10-CM | POA: Diagnosis not present

## 2016-03-02 DIAGNOSIS — M353 Polymyalgia rheumatica: Secondary | ICD-10-CM | POA: Diagnosis not present

## 2016-03-02 DIAGNOSIS — Z23 Encounter for immunization: Secondary | ICD-10-CM | POA: Diagnosis not present

## 2016-03-02 DIAGNOSIS — R269 Unspecified abnormalities of gait and mobility: Secondary | ICD-10-CM | POA: Diagnosis not present

## 2016-03-02 DIAGNOSIS — E784 Other hyperlipidemia: Secondary | ICD-10-CM | POA: Diagnosis not present

## 2016-03-02 DIAGNOSIS — F419 Anxiety disorder, unspecified: Secondary | ICD-10-CM | POA: Diagnosis not present

## 2016-03-02 DIAGNOSIS — M159 Polyosteoarthritis, unspecified: Secondary | ICD-10-CM | POA: Diagnosis not present

## 2016-03-02 DIAGNOSIS — M810 Age-related osteoporosis without current pathological fracture: Secondary | ICD-10-CM | POA: Diagnosis not present

## 2016-03-02 DIAGNOSIS — K219 Gastro-esophageal reflux disease without esophagitis: Secondary | ICD-10-CM | POA: Diagnosis not present

## 2016-05-31 HISTORY — PX: CATARACT EXTRACTION, BILATERAL: SHX1313

## 2016-06-21 DIAGNOSIS — M353 Polymyalgia rheumatica: Secondary | ICD-10-CM | POA: Diagnosis not present

## 2016-06-21 DIAGNOSIS — I10 Essential (primary) hypertension: Secondary | ICD-10-CM | POA: Diagnosis not present

## 2016-06-21 DIAGNOSIS — M159 Polyosteoarthritis, unspecified: Secondary | ICD-10-CM | POA: Diagnosis not present

## 2016-06-21 DIAGNOSIS — R269 Unspecified abnormalities of gait and mobility: Secondary | ICD-10-CM | POA: Diagnosis not present

## 2016-06-21 DIAGNOSIS — Z23 Encounter for immunization: Secondary | ICD-10-CM | POA: Diagnosis not present

## 2016-06-21 DIAGNOSIS — E784 Other hyperlipidemia: Secondary | ICD-10-CM | POA: Diagnosis not present

## 2016-06-21 DIAGNOSIS — R51 Headache: Secondary | ICD-10-CM | POA: Diagnosis not present

## 2016-06-21 DIAGNOSIS — M810 Age-related osteoporosis without current pathological fracture: Secondary | ICD-10-CM | POA: Diagnosis not present

## 2016-06-21 DIAGNOSIS — R062 Wheezing: Secondary | ICD-10-CM | POA: Diagnosis not present

## 2016-06-21 DIAGNOSIS — K219 Gastro-esophageal reflux disease without esophagitis: Secondary | ICD-10-CM | POA: Diagnosis not present

## 2016-06-21 DIAGNOSIS — D649 Anemia, unspecified: Secondary | ICD-10-CM | POA: Diagnosis not present

## 2016-06-21 DIAGNOSIS — F419 Anxiety disorder, unspecified: Secondary | ICD-10-CM | POA: Diagnosis not present

## 2016-10-18 DIAGNOSIS — D649 Anemia, unspecified: Secondary | ICD-10-CM | POA: Diagnosis not present

## 2016-10-18 DIAGNOSIS — E784 Other hyperlipidemia: Secondary | ICD-10-CM | POA: Diagnosis not present

## 2016-10-18 DIAGNOSIS — M159 Polyosteoarthritis, unspecified: Secondary | ICD-10-CM | POA: Diagnosis not present

## 2016-10-18 DIAGNOSIS — R269 Unspecified abnormalities of gait and mobility: Secondary | ICD-10-CM | POA: Diagnosis not present

## 2016-10-18 DIAGNOSIS — I10 Essential (primary) hypertension: Secondary | ICD-10-CM | POA: Diagnosis not present

## 2016-10-18 DIAGNOSIS — M353 Polymyalgia rheumatica: Secondary | ICD-10-CM | POA: Diagnosis not present

## 2016-10-18 DIAGNOSIS — G4700 Insomnia, unspecified: Secondary | ICD-10-CM | POA: Diagnosis not present

## 2016-10-18 DIAGNOSIS — F419 Anxiety disorder, unspecified: Secondary | ICD-10-CM | POA: Diagnosis not present

## 2016-10-18 DIAGNOSIS — R29898 Other symptoms and signs involving the musculoskeletal system: Secondary | ICD-10-CM | POA: Diagnosis not present

## 2016-10-18 DIAGNOSIS — K219 Gastro-esophageal reflux disease without esophagitis: Secondary | ICD-10-CM | POA: Diagnosis not present

## 2016-10-29 DIAGNOSIS — H26493 Other secondary cataract, bilateral: Secondary | ICD-10-CM | POA: Diagnosis not present

## 2016-10-29 DIAGNOSIS — H401232 Low-tension glaucoma, bilateral, moderate stage: Secondary | ICD-10-CM | POA: Diagnosis not present

## 2016-10-29 DIAGNOSIS — Z961 Presence of intraocular lens: Secondary | ICD-10-CM | POA: Diagnosis not present

## 2016-11-16 DIAGNOSIS — H26493 Other secondary cataract, bilateral: Secondary | ICD-10-CM | POA: Diagnosis not present

## 2016-11-16 DIAGNOSIS — Z961 Presence of intraocular lens: Secondary | ICD-10-CM | POA: Diagnosis not present

## 2016-11-23 DIAGNOSIS — Z961 Presence of intraocular lens: Secondary | ICD-10-CM | POA: Diagnosis not present

## 2016-11-23 DIAGNOSIS — H26491 Other secondary cataract, right eye: Secondary | ICD-10-CM | POA: Diagnosis not present

## 2017-03-07 DIAGNOSIS — Z23 Encounter for immunization: Secondary | ICD-10-CM | POA: Diagnosis not present

## 2017-03-07 DIAGNOSIS — R269 Unspecified abnormalities of gait and mobility: Secondary | ICD-10-CM | POA: Diagnosis not present

## 2017-03-07 DIAGNOSIS — R51 Headache: Secondary | ICD-10-CM | POA: Diagnosis not present

## 2017-03-07 DIAGNOSIS — D649 Anemia, unspecified: Secondary | ICD-10-CM | POA: Diagnosis not present

## 2017-03-07 DIAGNOSIS — G4700 Insomnia, unspecified: Secondary | ICD-10-CM | POA: Diagnosis not present

## 2017-03-07 DIAGNOSIS — R29898 Other symptoms and signs involving the musculoskeletal system: Secondary | ICD-10-CM | POA: Diagnosis not present

## 2017-03-07 DIAGNOSIS — G14 Postpolio syndrome: Secondary | ICD-10-CM | POA: Diagnosis not present

## 2017-03-07 DIAGNOSIS — M159 Polyosteoarthritis, unspecified: Secondary | ICD-10-CM | POA: Diagnosis not present

## 2017-03-07 DIAGNOSIS — E7849 Other hyperlipidemia: Secondary | ICD-10-CM | POA: Diagnosis not present

## 2017-03-07 DIAGNOSIS — K219 Gastro-esophageal reflux disease without esophagitis: Secondary | ICD-10-CM | POA: Diagnosis not present

## 2017-03-07 DIAGNOSIS — I10 Essential (primary) hypertension: Secondary | ICD-10-CM | POA: Diagnosis not present

## 2017-03-07 DIAGNOSIS — M810 Age-related osteoporosis without current pathological fracture: Secondary | ICD-10-CM | POA: Diagnosis not present

## 2017-04-29 ENCOUNTER — Ambulatory Visit (INDEPENDENT_AMBULATORY_CARE_PROVIDER_SITE_OTHER): Payer: Medicare Other | Admitting: Neurology

## 2017-04-29 ENCOUNTER — Encounter: Payer: Self-pay | Admitting: Neurology

## 2017-04-29 VITALS — BP 131/85 | HR 100 | Ht 64.0 in | Wt 140.0 lb

## 2017-04-29 DIAGNOSIS — Z9181 History of falling: Secondary | ICD-10-CM | POA: Diagnosis not present

## 2017-04-29 DIAGNOSIS — R269 Unspecified abnormalities of gait and mobility: Secondary | ICD-10-CM

## 2017-04-29 DIAGNOSIS — R531 Weakness: Secondary | ICD-10-CM | POA: Diagnosis not present

## 2017-04-29 DIAGNOSIS — M5416 Radiculopathy, lumbar region: Secondary | ICD-10-CM | POA: Diagnosis not present

## 2017-04-29 DIAGNOSIS — R29898 Other symptoms and signs involving the musculoskeletal system: Secondary | ICD-10-CM | POA: Diagnosis not present

## 2017-04-29 MED ORDER — GABAPENTIN 300 MG PO CAPS: 300.0000 mg | ORAL_CAPSULE | Freq: Every day | ORAL | 6 refills | Status: DC

## 2017-04-29 NOTE — Progress Notes (Signed)
[facility]    Provider:  Dr Jaynee Eagles Referring Provider: Vernie Shanks, MD Primary Care Physician:  Vernie Shanks, MD  CC:  Left leg pain and weakness  HPI:  Jacqueline Foley is a 83 y.o. female here as a referral from Dr. Jacelyn Grip for leg pain. PMHx HTN, gait disturbance, anemia, left leg weakness, HLD, osteoarthritis, chronic intractable headache, insomnia, anxiety. She has a history of insomnia. She has post polio syndrome. This past August she noticed weakness, new constant pain, waking her up in the middle of the night, she can't sleep. She is having difficulty getting out of bed, stiffness in the leg. She has low back pain, She also has neck pain and shooting pain down her left leg. She also has bad headaches.  She has numbness in her left arm. Her left toes throb. She has an electric shock down her leg and feels like the toes are going to "bust open". She has difficulty walking when she has the pain. No falls. She also gets cramping in the left leg. She also has a history of surgery in her foot on the left. Progressive, worsening. Weakness in the left arm and leg. She tried physical therapy and medical management with OTC medications and prescription medications from her primary care. PT did not help. No other focal neurologic deficits, associated symptoms, inciting events or modifiable factors.  Reviewed notes, labs and imaging from outside physicians, which showed:  Personally reviewed images of CT head 11/2015, unremarkable for age.   Reviewed labs, 03/02/2016  Unremarkable cbc, ferritin 8, CMP with     Review of Systems: Patient complains of symptoms per HPI as well as the following symptoms: Cough, allergies, runny nose, difficulty swallowing, dizziness, depression, anxiety. Pertinent negatives and positives per HPI. All others negative.   Social History   Socioeconomic History  . Marital status: Divorced    Spouse name: Not on file  . Number of children: 6   . Years of education: 86  . Highest education level: Not on file  Social Needs  . Financial resource strain: Not on file  . Food insecurity - worry: Not on file  . Food insecurity - inability: Not on file  . Transportation needs - medical: Not on file  . Transportation needs - non-medical: Not on file  Occupational History  . Not on file  Tobacco Use  . Smoking status: Never Smoker  . Smokeless tobacco: Never Used  Substance and Sexual Activity  . Alcohol use: Yes    Comment: wine during some holidays  . Drug use: No  . Sexual activity: Not on file  Other Topics Concern  . Not on file  Social History Narrative   Lives at home alone   Right handed   3 cups caffeine daily    Family History  Problem Relation Age of Onset  . Diabetes Mother   . Heart disease Mother   . Hypertension Mother   . Prostate cancer Father     Past Medical History:  Diagnosis Date  . Chest pain at rest, negative MI, negative stress test, may have been GI 12/06/2013  . Diabetes mellitus without complication ([place])   . DOE (dyspnea on exertion), may be due to HTN 12/06/2013  . GERD (gastroesophageal reflux disease)   . H/O diabetes mellitus   . HTN (hypertension) 12/06/2013  . Hypercholesteremia   . Hypertension   . TIA (transient ischemic attack)     Past Surgical History:  Procedure Laterality Date  .  CATARACT EXTRACTION, BILATERAL  2018  . EYE SURGERY    . FOOT SURGERY      Current Outpatient Medications  Medication Sig Dispense Refill  . albuterol (PROVENTIL HFA;VENTOLIN HFA) 108 (90 Base) MCG/ACT inhaler Inhale 2 puffs into the lungs every 6 (six) hours as needed for wheezing or shortness of breath.    Marland Kitchen aspirin EC 81 MG tablet Take 81 mg by mouth daily.    Marland Kitchen atorvastatin (LIPITOR) 20 MG tablet Take 10 mg by mouth daily.    . Biotin (BIOTIN MAXIMUM STRENGTH) 5 MG CAPS Take 1 capsule by mouth daily.    . Cholecalciferol (VITAMIN D3 PO) Take 1 tablet by mouth daily. 1000 units = 1 tablet     . doxepin (SINEQUAN) 25 MG capsule Take 50 mg by mouth at bedtime. Take with food  3  . fluticasone (FLONASE) 50 MCG/ACT nasal spray Place 1 spray into both nostrils daily.    . pantoprazole (PROTONIX) 40 MG tablet Take 40 mg by mouth daily.  2  . triamterene-hydrochlorothiazide (DYAZIDE) 37.5-25 MG capsule Take 1 capsule by mouth daily.    Marland Kitchen gabapentin (NEURONTIN) 300 MG capsule Take 1 capsule (300 mg total) by mouth at bedtime. 30 capsule 6   No current facility-administered medications for this visit.     Allergies as of 04/29/2017 - Review Complete 04/29/2017  Allergen Reaction Noted  . Tomato Swelling and Rash 12/06/2013    Vitals: BP 131/85 (BP Location: Left Arm, Patient Position: Sitting)   Pulse 100   Ht 5\' 4"  (1.626 m)   Wt 140 lb (63.5 kg)   BMI 24.03 kg/m  Last Weight:  Wt Readings from Last 1 Encounters:  04/29/17 140 lb (63.5 kg)   Last Height:   Ht Readings from Last 1 Encounters:  04/29/17 5\' 4"  (1.626 m)     Physical exam: Exam: Gen: NAD, conversant, well nourised, obese, well groomed                     CV: RRR, no MRG. No Carotid Bruits. No peripheral edema, warm, nontender Eyes: Conjunctivae clear without exudates or hemorrhage MSK: Left flatttened arch  Neuro: Detailed Neurologic Exam  Speech:    Speech is normal; fluent and spontaneous with normal comprehension.  Cognition:    The patient is oriented to person, place, and time;     recent and remote memory intact;     language fluent;     normal attention, concentration,     fund of knowledge Cranial Nerves:    The pupils are equal, round, and reactive to light.Attempted fundoscopic exam could not visualize. Left eye impaired movements multiple surgeries( chronic),  right eye EOMI. Vision intact. Trigeminal sensation is intact and the muscles of mastication are normal. The face is symmetric. The palate elevates in the midline. Hearing intact. Voice is normal. Shoulder shrug is normal. The  tongue has normal motion without fasciculations.   Coordination:    Normal finger to nose and heel to shin. Normal rapid alternating movements.   Gait:     Difficulty standing, antalgic, stooped  Motor Observation:    No asymmetry, no atrophy, and no involuntary movements noted. Tone:    Normal muscle tone.    Posture:    Stooped    Strength: left arm and leg prox weakness otherwise srength is V/V in the upper and lower limbs.      Sensation: intact to LT     Reflex Exam:  DTR's:  Absent left patellar otherwise brisk otherwise Deep tendon reflexes in the upper and lower extremities are brisk bilaterally.   Toes:    The toes are downgoing bilaterally.   Clonus:    Clonus is absent.   Assessment/Plan:  Jacqueline Foley is a 83 y.o. female here as a referral from Dr. Jacelyn Grip for leg pain. PMHx HTN, gait disturbance, anemia, left leg weakness, HLD, osteoarthritis, chronic intractable headache, insomnia, anxiety. She has a history of insomnia. She has post polio syndrome.  Patient reports weakness and constant pain in the left leg, difficulty getting out of bed, stiffness in the morning improved with walking and low back pain and shooting pain down the left leg.  Likely lumbar radiculopathy due to degenerative disc disease, need an MRI of the lumbar spine, patient also reports weakness in the left arm and leg, physical therapy did not help.  Need MRI of the brain to evaluate for stroke and MRI of the lumbar spine to evaluate for lumbar radiculopathy for surgical or other interventions.  Left flatttened arch: recommend Podiatrist Physical therapy home and fall risk assessment Needs a Education officer, museum as well at home Fall risk, discussed.  Orders Placed This Encounter  Procedures  . MR BRAIN WO CONTRAST  . MR LUMBAR SPINE WO CONTRAST  . Ambulatory referral to Home Health   Cc: Dr. Tye Maryland, MD  [facility] [address] [place],  [place] [address]  Phone [phone] Fax [phone]

## 2017-04-29 NOTE — Patient Instructions (Addendum)
Physical therapy Recommend Podiatrist MRI of the lumbar spine and brain  Gabapentin (neurontin) at night  Gabapentin capsules or tablets What is this medicine? GABAPENTIN (GA ba pen tin) is used to control partial seizures in adults with epilepsy. It is also used to treat certain types of nerve pain. This medicine may be used for other purposes; ask your health care provider or pharmacist if you have questions. COMMON BRAND NAME(S): Active-PAC with Gabapentin, Gabarone, Neurontin What should I tell my health care provider before I take this medicine? They need to know if you have any of these conditions: -kidney disease -suicidal thoughts, plans, or attempt; a previous suicide attempt by you or a family member -an unusual or allergic reaction to gabapentin, other medicines, foods, dyes, or preservatives -pregnant or trying to get pregnant -breast-feeding How should I use this medicine? Take this medicine by mouth with a glass of water. Follow the directions on the prescription label. You can take it with or without food. If it upsets your stomach, take it with food.Take your medicine at regular intervals. Do not take it more often than directed. Do not stop taking except on your doctor's advice. If you are directed to break the 600 or 800 mg tablets in half as part of your dose, the extra half tablet should be used for the next dose. If you have not used the extra half tablet within 28 days, it should be thrown away. A special MedGuide will be given to you by the pharmacist with each prescription and refill. Be sure to read this information carefully each time. Talk to your pediatrician regarding the use of this medicine in children. Special care may be needed. Overdosage: If you think you have taken too much of this medicine contact a poison control center or emergency room at once. NOTE: This medicine is only for you. Do not share this medicine with others. What if I miss a dose? If you  miss a dose, take it as soon as you can. If it is almost time for your next dose, take only that dose. Do not take double or extra doses. What may interact with this medicine? Do not take this medicine with any of the following medications: -other gabapentin products This medicine may also interact with the following medications: -alcohol -antacids -antihistamines for allergy, cough and cold -certain medicines for anxiety or sleep -certain medicines for depression or psychotic disturbances -homatropine; hydrocodone -naproxen -narcotic medicines (opiates) for pain -phenothiazines like chlorpromazine, mesoridazine, prochlorperazine, thioridazine This list may not describe all possible interactions. Give your health care provider a list of all the medicines, herbs, non-prescription drugs, or dietary supplements you use. Also tell them if you smoke, drink alcohol, or use illegal drugs. Some items may interact with your medicine. What should I watch for while using this medicine? Visit your doctor or health care professional for regular checks on your progress. You may want to keep a record at home of how you feel your condition is responding to treatment. You may want to share this information with your doctor or health care professional at each visit. You should contact your doctor or health care professional if your seizures get worse or if you have any new types of seizures. Do not stop taking this medicine or any of your seizure medicines unless instructed by your doctor or health care professional. Stopping your medicine suddenly can increase your seizures or their severity. Wear a medical identification bracelet or chain if you are taking this  medicine for seizures, and carry a card that lists all your medications. You may get drowsy, dizzy, or have blurred vision. Do not drive, use machinery, or do anything that needs mental alertness until you know how this medicine affects you. To reduce dizzy  or fainting spells, do not sit or stand up quickly, especially if you are an older patient. Alcohol can increase drowsiness and dizziness. Avoid alcoholic drinks. Your mouth may get dry. Chewing sugarless gum or sucking hard candy, and drinking plenty of water will help. The use of this medicine may increase the chance of suicidal thoughts or actions. Pay special attention to how you are responding while on this medicine. Any worsening of mood, or thoughts of suicide or dying should be reported to your health care professional right away. Women who become pregnant while using this medicine may enroll in the [place] Pregnancy Registry by calling [phone]. This registry collects information about the safety of antiepileptic drug use during pregnancy. What side effects may I notice from receiving this medicine? Side effects that you should report to your doctor or health care professional as soon as possible: -allergic reactions like skin rash, itching or hives, swelling of the face, lips, or tongue -worsening of mood, thoughts or actions of suicide or dying Side effects that usually do not require medical attention (report to your doctor or health care professional if they continue or are bothersome): -constipation -difficulty walking or controlling muscle movements -dizziness -nausea -slurred speech -tiredness -tremors -weight gain This list may not describe all possible side effects. Call your doctor for medical advice about side effects. You may report side effects to FDA at 1-800-FDA-1088. Where should I keep my medicine? Keep out of reach of children. This medicine may cause accidental overdose and death if it taken by other adults, children, or pets. Mix any unused medicine with a substance like cat litter or coffee grounds. Then throw the medicine away in a sealed container like a sealed bag or a coffee can with a lid. Do not use the medicine after the  expiration date. Store at room temperature between 15 and 30 degrees C (59 and 86 degrees F). NOTE: This sheet is a summary. It may not cover all possible information. If you have questions about this medicine, talk to your doctor, pharmacist, or health care provider.  2018 Elsevier/Gold Standard (2013-07-13 15:26:50)

## 2017-05-02 DIAGNOSIS — M5416 Radiculopathy, lumbar region: Secondary | ICD-10-CM | POA: Insufficient documentation

## 2017-05-03 ENCOUNTER — Ambulatory Visit: Payer: Commercial Managed Care - PPO | Admitting: Neurology

## 2017-05-03 DIAGNOSIS — H401232 Low-tension glaucoma, bilateral, moderate stage: Secondary | ICD-10-CM | POA: Diagnosis not present

## 2017-05-03 DIAGNOSIS — Z961 Presence of intraocular lens: Secondary | ICD-10-CM | POA: Diagnosis not present

## 2017-05-05 ENCOUNTER — Telehealth: Payer: Self-pay

## 2017-05-05 DIAGNOSIS — M79605 Pain in left leg: Secondary | ICD-10-CM | POA: Diagnosis not present

## 2017-05-05 DIAGNOSIS — M542 Cervicalgia: Secondary | ICD-10-CM | POA: Diagnosis not present

## 2017-05-05 NOTE — Telephone Encounter (Signed)
Nurse from [facility] called to just let you know that she is adding nursing once a week x 4 weeks to patient's home health. She didn't need a call back, this was just an [place].

## 2017-05-06 DIAGNOSIS — M542 Cervicalgia: Secondary | ICD-10-CM | POA: Diagnosis not present

## 2017-05-06 DIAGNOSIS — M79605 Pain in left leg: Secondary | ICD-10-CM | POA: Diagnosis not present

## 2017-05-11 DIAGNOSIS — M79605 Pain in left leg: Secondary | ICD-10-CM | POA: Diagnosis not present

## 2017-05-11 DIAGNOSIS — M542 Cervicalgia: Secondary | ICD-10-CM | POA: Diagnosis not present

## 2017-05-13 DIAGNOSIS — M79605 Pain in left leg: Secondary | ICD-10-CM | POA: Diagnosis not present

## 2017-05-13 DIAGNOSIS — M542 Cervicalgia: Secondary | ICD-10-CM | POA: Diagnosis not present

## 2017-05-16 ENCOUNTER — Telehealth: Payer: Self-pay | Admitting: Neurology

## 2017-05-16 DIAGNOSIS — M79605 Pain in left leg: Secondary | ICD-10-CM | POA: Diagnosis not present

## 2017-05-16 DIAGNOSIS — M542 Cervicalgia: Secondary | ICD-10-CM | POA: Diagnosis not present

## 2017-05-16 MED ORDER — GABAPENTIN 300 MG PO CAPS: 600.0000 mg | ORAL_CAPSULE | Freq: Every day | ORAL | 6 refills | Status: DC

## 2017-05-16 NOTE — Telephone Encounter (Signed)
Called the number back and spoke with the patient's daughter Jacqueline Foley (on [place]). She confirmed that the gabapentin is not helping so far. I spoke with Dr. Jaynee Eagles & we will increase Gabapentin to 600 mg daily at bedtime. Daughter verbalized understanding. She states that the patient has continued to not take Doxepin per d/w Dr. Jaynee Eagles at last visit. Per Dr. Jaynee Eagles, pt can take them together but may cause excessive sleepiness. Daughter concerned about sleepiness and for now will let pt increase dose of Gabapentin and see how she does and then call us back to see about adding Doxepin back in to regimen. Daughter wanted pt called. I called the patient and she verbalized understanding of the above information. Gabapentin e-scribed to pt's pharmacy.

## 2017-05-16 NOTE — Telephone Encounter (Signed)
Pt called gabapentin is not helping with pain and she is waking up after 4 hrs of sleep in pain. It takes a couple of hours to fall back asleep. She has an appt with orthopaedic Dr on Wednesday. Please call to advise

## 2017-05-17 DIAGNOSIS — M79672 Pain in left foot: Secondary | ICD-10-CM | POA: Diagnosis not present

## 2017-05-17 DIAGNOSIS — M545 Low back pain: Secondary | ICD-10-CM | POA: Diagnosis not present

## 2017-05-17 DIAGNOSIS — G5762 Lesion of plantar nerve, left lower limb: Secondary | ICD-10-CM | POA: Diagnosis not present

## 2017-05-17 DIAGNOSIS — M5442 Lumbago with sciatica, left side: Secondary | ICD-10-CM | POA: Diagnosis not present

## 2017-05-18 DIAGNOSIS — M79605 Pain in left leg: Secondary | ICD-10-CM | POA: Diagnosis not present

## 2017-05-18 DIAGNOSIS — M542 Cervicalgia: Secondary | ICD-10-CM | POA: Diagnosis not present

## 2017-05-23 DIAGNOSIS — M542 Cervicalgia: Secondary | ICD-10-CM | POA: Diagnosis not present

## 2017-05-23 DIAGNOSIS — M79605 Pain in left leg: Secondary | ICD-10-CM | POA: Diagnosis not present

## 2017-05-26 DIAGNOSIS — M79605 Pain in left leg: Secondary | ICD-10-CM | POA: Diagnosis not present

## 2017-05-26 DIAGNOSIS — M542 Cervicalgia: Secondary | ICD-10-CM | POA: Diagnosis not present

## 2017-06-02 DIAGNOSIS — L03119 Cellulitis of unspecified part of limb: Secondary | ICD-10-CM | POA: Diagnosis not present

## 2017-06-03 DIAGNOSIS — M25572 Pain in left ankle and joints of left foot: Secondary | ICD-10-CM | POA: Diagnosis not present

## 2017-06-03 DIAGNOSIS — L03119 Cellulitis of unspecified part of limb: Secondary | ICD-10-CM | POA: Diagnosis not present

## 2017-06-06 DIAGNOSIS — R51 Headache: Secondary | ICD-10-CM | POA: Diagnosis not present

## 2017-06-06 DIAGNOSIS — D649 Anemia, unspecified: Secondary | ICD-10-CM | POA: Diagnosis not present

## 2017-06-06 DIAGNOSIS — L03119 Cellulitis of unspecified part of limb: Secondary | ICD-10-CM | POA: Diagnosis not present

## 2017-06-06 DIAGNOSIS — G14 Postpolio syndrome: Secondary | ICD-10-CM | POA: Diagnosis not present

## 2017-06-06 DIAGNOSIS — M810 Age-related osteoporosis without current pathological fracture: Secondary | ICD-10-CM | POA: Diagnosis not present

## 2017-06-06 DIAGNOSIS — M79604 Pain in right leg: Secondary | ICD-10-CM | POA: Diagnosis not present

## 2017-06-06 DIAGNOSIS — M25572 Pain in left ankle and joints of left foot: Secondary | ICD-10-CM | POA: Diagnosis not present

## 2017-06-06 DIAGNOSIS — R29898 Other symptoms and signs involving the musculoskeletal system: Secondary | ICD-10-CM | POA: Diagnosis not present

## 2017-06-06 DIAGNOSIS — E785 Hyperlipidemia, unspecified: Secondary | ICD-10-CM | POA: Diagnosis not present

## 2017-06-06 DIAGNOSIS — M79605 Pain in left leg: Secondary | ICD-10-CM | POA: Diagnosis not present

## 2017-06-06 DIAGNOSIS — M7989 Other specified soft tissue disorders: Secondary | ICD-10-CM | POA: Diagnosis not present

## 2017-06-06 DIAGNOSIS — I10 Essential (primary) hypertension: Secondary | ICD-10-CM | POA: Diagnosis not present

## 2017-06-06 DIAGNOSIS — M159 Polyosteoarthritis, unspecified: Secondary | ICD-10-CM | POA: Diagnosis not present

## 2017-06-07 ENCOUNTER — Other Ambulatory Visit: Payer: Self-pay | Admitting: Family Medicine

## 2017-06-07 DIAGNOSIS — M79605 Pain in left leg: Secondary | ICD-10-CM

## 2017-06-08 ENCOUNTER — Telehealth: Payer: Self-pay | Admitting: *Deleted

## 2017-06-08 NOTE — Telephone Encounter (Signed)
Home Health Plan of Care papers signed by Dr. Jaynee Eagles and faxed to [facility]. Received a receipt of confirmation.

## 2017-06-10 DIAGNOSIS — M25562 Pain in left knee: Secondary | ICD-10-CM | POA: Diagnosis not present

## 2017-06-10 DIAGNOSIS — M1712 Unilateral primary osteoarthritis, left knee: Secondary | ICD-10-CM | POA: Diagnosis not present

## 2017-06-10 DIAGNOSIS — M1711 Unilateral primary osteoarthritis, right knee: Secondary | ICD-10-CM | POA: Diagnosis not present

## 2017-06-10 DIAGNOSIS — M25561 Pain in right knee: Secondary | ICD-10-CM | POA: Diagnosis not present

## 2017-06-17 DIAGNOSIS — M1712 Unilateral primary osteoarthritis, left knee: Secondary | ICD-10-CM | POA: Diagnosis not present

## 2017-06-17 DIAGNOSIS — M1711 Unilateral primary osteoarthritis, right knee: Secondary | ICD-10-CM | POA: Diagnosis not present

## 2017-07-04 DIAGNOSIS — M1712 Unilateral primary osteoarthritis, left knee: Secondary | ICD-10-CM | POA: Diagnosis not present

## 2017-07-04 DIAGNOSIS — M1711 Unilateral primary osteoarthritis, right knee: Secondary | ICD-10-CM | POA: Diagnosis not present

## 2017-07-07 DIAGNOSIS — M1712 Unilateral primary osteoarthritis, left knee: Secondary | ICD-10-CM | POA: Diagnosis not present

## 2017-07-07 DIAGNOSIS — M1711 Unilateral primary osteoarthritis, right knee: Secondary | ICD-10-CM | POA: Diagnosis not present

## 2017-07-08 DIAGNOSIS — L299 Pruritus, unspecified: Secondary | ICD-10-CM | POA: Diagnosis not present

## 2017-07-08 DIAGNOSIS — M159 Polyosteoarthritis, unspecified: Secondary | ICD-10-CM | POA: Diagnosis not present

## 2017-07-08 DIAGNOSIS — I10 Essential (primary) hypertension: Secondary | ICD-10-CM | POA: Diagnosis not present

## 2017-07-08 DIAGNOSIS — R51 Headache: Secondary | ICD-10-CM | POA: Diagnosis not present

## 2017-07-08 DIAGNOSIS — M79604 Pain in right leg: Secondary | ICD-10-CM | POA: Diagnosis not present

## 2017-07-08 DIAGNOSIS — G4700 Insomnia, unspecified: Secondary | ICD-10-CM | POA: Diagnosis not present

## 2017-07-08 DIAGNOSIS — D649 Anemia, unspecified: Secondary | ICD-10-CM | POA: Diagnosis not present

## 2017-07-08 DIAGNOSIS — R29898 Other symptoms and signs involving the musculoskeletal system: Secondary | ICD-10-CM | POA: Diagnosis not present

## 2017-07-08 DIAGNOSIS — E785 Hyperlipidemia, unspecified: Secondary | ICD-10-CM | POA: Diagnosis not present

## 2017-07-08 DIAGNOSIS — G14 Postpolio syndrome: Secondary | ICD-10-CM | POA: Diagnosis not present

## 2017-07-08 DIAGNOSIS — R269 Unspecified abnormalities of gait and mobility: Secondary | ICD-10-CM | POA: Diagnosis not present

## 2017-07-08 DIAGNOSIS — M7989 Other specified soft tissue disorders: Secondary | ICD-10-CM | POA: Diagnosis not present

## 2017-07-12 DIAGNOSIS — M1711 Unilateral primary osteoarthritis, right knee: Secondary | ICD-10-CM | POA: Diagnosis not present

## 2017-07-12 DIAGNOSIS — M1712 Unilateral primary osteoarthritis, left knee: Secondary | ICD-10-CM | POA: Diagnosis not present

## 2017-07-19 DIAGNOSIS — M1712 Unilateral primary osteoarthritis, left knee: Secondary | ICD-10-CM | POA: Diagnosis not present

## 2017-07-19 DIAGNOSIS — M1711 Unilateral primary osteoarthritis, right knee: Secondary | ICD-10-CM | POA: Diagnosis not present

## 2017-07-26 DIAGNOSIS — M1712 Unilateral primary osteoarthritis, left knee: Secondary | ICD-10-CM | POA: Diagnosis not present

## 2017-07-26 DIAGNOSIS — M1711 Unilateral primary osteoarthritis, right knee: Secondary | ICD-10-CM | POA: Diagnosis not present

## 2017-07-29 ENCOUNTER — Inpatient Hospital Stay
Admission: RE | Admit: 2017-07-29 | Discharge: 2017-07-29 | Disposition: A | Discharge status: Home / Self Care | Payer: Medicare Other | Source: Ambulatory Visit | Attending: Family Medicine | Admitting: Family Medicine

## 2017-07-29 ENCOUNTER — Ambulatory Visit
Admission: RE | Admit: 2017-07-29 | Discharge: 2017-07-29 | Disposition: A | Discharge status: Home / Self Care | Payer: Medicare Other | Source: Ambulatory Visit | Attending: Family Medicine | Admitting: Family Medicine

## 2017-07-29 DIAGNOSIS — M79605 Pain in left leg: Secondary | ICD-10-CM

## 2017-07-29 DIAGNOSIS — M79672 Pain in left foot: Secondary | ICD-10-CM | POA: Diagnosis not present

## 2017-08-04 ENCOUNTER — Encounter: Payer: Self-pay | Admitting: Neurology

## 2017-08-04 ENCOUNTER — Ambulatory Visit (INDEPENDENT_AMBULATORY_CARE_PROVIDER_SITE_OTHER): Payer: Medicare Other | Admitting: Neurology

## 2017-08-04 VITALS — BP 140/82 | HR 83 | Ht 64.0 in | Wt 140.4 lb

## 2017-08-04 DIAGNOSIS — M5416 Radiculopathy, lumbar region: Secondary | ICD-10-CM

## 2017-08-04 DIAGNOSIS — M79675 Pain in left toe(s): Secondary | ICD-10-CM | POA: Diagnosis not present

## 2017-08-04 DIAGNOSIS — R269 Unspecified abnormalities of gait and mobility: Secondary | ICD-10-CM | POA: Diagnosis not present

## 2017-08-04 DIAGNOSIS — M2140 Flat foot [pes planus] (acquired), unspecified foot: Secondary | ICD-10-CM | POA: Diagnosis not present

## 2017-08-04 DIAGNOSIS — M79672 Pain in left foot: Secondary | ICD-10-CM

## 2017-08-04 MED ORDER — PREGABALIN 75 MG PO CAPS: 75.0000 mg | ORAL_CAPSULE | Freq: Two times a day (BID) | ORAL | 3 refills | Status: DC

## 2017-08-04 NOTE — Patient Instructions (Addendum)
Continue Physical therapy Recommend Podiatrist OR discuss with Ortho about the left flat foot and support shoes or shots into the foot for plantar fasciitis or other structural cause of pain. Wearing shoes wihtout any support today.  MRI of the lumbar spine and brain - call [facility] with Orthopaedics   Flat Feet, Adult Normally, a foot has a curve, called an arch, on its inner side. The arch creates a gap between the foot and the ground. Flat feet is a common condition in which one or both feet do not have an arch. What are the causes? This condition may be caused by:  Failure of a normal arch to develop during childhood.  An injury to tendons and ligaments in the foot, such as to the tendon that supports the arch (posterior tibial tendon).  Loose tendons or ligaments in the foot.  A wearing down of the arch over time.  Injury to bones in the foot.  An abnormality in the bones of the foot, called tarsal coalition. This happens when two or more bones in the foot are joined together (fused) before birth.  What increases the risk? This condition is more likely to develop in:  Females.  Adults age 57 or older.  People who: ? Have a family history of flat feet. ? Have a history of childhood flexible flatfoot. ? Are obese. ? Have diabetes. ? Have high blood pressure. ? Participate in high-impact sports. ? Have inflammatory arthritis. ? Have a history of broken (fractured) or dislocated bones in the foot.  What are the signs or symptoms? Symptoms of this condition include:  Pain or tightness along the bottom of the foot.  Foot pain that gets worse with activity.  Swelling of the inner side of the foot.  Swelling of the ankle.  Pain on the outer side of the ankle.  Changes in the way that you walk (gait).  Pronation. This is when the foot and ankle lean inward when you are standing.  Bony bumps on the top or inner side of the foot.  How is this  diagnosed? This condition is diagnosed with a physical exam of your foot and ankle. Your health care provider may also:  Look at your shoes for patterns of wear on the soles.  Order imaging tests, such as X-rays, a CT scan, or an MRI.  Refer you to a health care provider who specializes in feet (podiatrist) or a physical therapist.  How is this treated? This condition may be treated with:  Stretching exercises or physical therapy. This helps to increase range of motion and relieve pain.  A shoe insert (orthotic). This helps to support the arch of your foot. Orthotics can be purchased from a store or can be custom-made by your health care provider.  Wearing shoes with appropriate arch support. This is especially important for athletes.  Medicines. These may be prescribed to relieve pain.  An ankle brace, boot, or cast. These may be used to relieve pressure on your foot. You may be given crutches if walking is painful.  Surgery. This may be done to improve the alignment of your foot. This is only needed if your posterior tibial tendon is torn or if you have tarsal coalition.  Follow these instructions at home: Activity  Do any exercises as told by your health care provider.  If an activity causes pain, avoid it or try to find another activity that does not cause pain. General instructions  Wear orthotics and appropriate  shoes as told by your health care provider.  Take over-the-counter and prescription medicines only as told by your health care provider.  Wear an ankle brace, boot, or cast as told by your health care provider.  Use crutches as told by your health care provider.  Keep all follow-up visits as told by your health care provider. This is important. How is this prevented? To prevent the condition from getting worse:  Wear comfortable, supportive shoes that are appropriate for your activities.  Maintain a healthy weight.  Stay active in a way that your health  care provider recommends. This will help to keep your feet flexible and strong.  Manage long-term (chronic) health conditions, such as diabetes, high blood pressure, and inflammatory arthritis.  Work with a health care provider if you have concerns about your feet or shoes.  Contact a health care provider if:  You have pain in your foot or lower leg that gets worse or does not improve with medicine.  You have pain or difficulty when walking.  You have problems with your orthotics. Summary  Flat feet is a common condition in which one or both feet do not have a curve, called an arch, on the inner side.  Your health care provider may recommend a shoe insert (orthotic) or shoes with the appropriate arch support.  Other treatments may include stretching exercises or physical therapy, medicines to relieve pain, and wearing an ankle brace, boot, or cast.  Surgery may be done if you have a tear in the tendon that supports your arch (posterior tibial tendon) or if two or more of your foot bones were joined together (fused)  before birth (tarsal coalition). This information is not intended to replace advice given to you by your health care provider. Make sure you discuss any questions you have with your health care provider. Document Released: 03/14/2009 Document Revised: 07/28/2016 Document Reviewed: 07/28/2016 Elsevier Interactive Patient Education  2018 [employer].    Plantar Fasciitis Plantar fasciitis is a painful foot condition that affects the heel. It occurs when the band of tissue that connects the toes to the heel bone (plantar fascia) becomes irritated. This can happen after exercising too much or doing other repetitive activities (overuse injury). The pain from plantar fasciitis can range from mild irritation to severe pain that makes it difficult for you to walk or move. The pain is usually worse in the morning or after you have been sitting or lying down for a while. What are  the causes? This condition may be caused by:  Standing for long periods of time.  Wearing shoes that do not fit.  Doing high-impact activities, including running, aerobics, and ballet.  Being overweight.  Having an abnormal way of walking (gait).  Having tight calf muscles.  Having high arches in your feet.  Starting a new athletic activity.  What are the signs or symptoms? The main symptom of this condition is heel pain. Other symptoms include:  Pain that gets worse after activity or exercise.  Pain that is worse in the morning or after resting.  Pain that goes away after you walk for a few minutes.  How is this diagnosed? This condition may be diagnosed based on your signs and symptoms. Your health care provider will also do a physical exam to check for:  A tender area on the bottom of your foot.  A high arch in your foot.  Pain when you move your foot.  Difficulty moving your foot.  You  may also need to have imaging studies to confirm the diagnosis. These can include:  X-rays.  Ultrasound.  MRI.  How is this treated? Treatment for plantar fasciitis depends on the severity of the condition. Your treatment may include:  Rest, ice, and over-the-counter pain medicines to manage your pain.  Exercises to stretch your calves and your plantar fascia.  A splint that holds your foot in a stretched, upward position while you sleep (night splint).  Physical therapy to relieve symptoms and prevent problems in the future.  Cortisone injections to relieve severe pain.  Extracorporeal shock wave therapy (ESWT) to stimulate damaged plantar fascia with electrical impulses. It is often used as a last resort before surgery.  Surgery, if other treatments have not worked after 12 months.  Follow these instructions at home:  Take medicines only as directed by your health care provider.  Avoid activities that cause pain.  Roll the bottom of your foot over a bag of ice  or a bottle of cold water. Do this for 20 minutes, 3-4 times a day.  Perform simple stretches as directed by your health care provider.  Try wearing athletic shoes with air-sole or gel-sole cushions or soft shoe inserts.  Wear a night splint while sleeping, if directed by your health care provider.  Keep all follow-up appointments with your health care provider. How is this prevented?  Do not perform exercises or activities that cause heel pain.  Consider finding low-impact activities if you continue to have problems.  Lose weight if you need to. The best way to prevent plantar fasciitis is to avoid the activities that aggravate your plantar fascia. Contact a health care provider if:  Your symptoms do not go away after treatment with home care measures.  Your pain gets worse.  Your pain affects your ability to move or do your daily activities. This information is not intended to replace advice given to you by your health care provider. Make sure you discuss any questions you have with your health care provider. Document Released: 02/09/2001 Document Revised: 10/20/2015 Document Reviewed: 03/27/2014 Elsevier Interactive Patient Education  2018 [employer].  Pregabalin capsules What is this medicine? PREGABALIN (pre GAB a lin) is used to treat nerve pain from diabetes, shingles, spinal cord injury, and fibromyalgia. It is also used to control seizures in epilepsy. This medicine may be used for other purposes; ask your health care provider or pharmacist if you have questions. COMMON BRAND NAME(S): Lyrica What should I tell my health care provider before I take this medicine? They need to know if you have any of these conditions: -bleeding problems -heart disease, including heart failure -history of alcohol or drug abuse -kidney disease -suicidal thoughts, plans, or attempt; a previous suicide attempt by you or a family member -an unusual or allergic reaction to pregabalin,  gabapentin, other medicines, foods, dyes, or preservatives -pregnant or trying to get pregnant or trying to conceive with your partner -breast-feeding How should I use this medicine? Take this medicine by mouth with a glass of water. Follow the directions on the prescription label. You can take this medicine with or without food. Take your doses at regular intervals. Do not take your medicine more often than directed. Do not stop taking except on your doctor's advice. A special MedGuide will be given to you by the pharmacist with each prescription and refill. Be sure to read this information carefully each time. Talk to your pediatrician regarding the use of this medicine in children. Special  care may be needed. Overdosage: If you think you have taken too much of this medicine contact a poison control center or emergency room at once. NOTE: This medicine is only for you. Do not share this medicine with others. What if I miss a dose? If you miss a dose, take it as soon as you can. If it is almost time for your next dose, take only that dose. Do not take double or extra doses. What may interact with this medicine? -alcohol -certain medicines for blood pressure like captopril, enalapril, or lisinopril -certain medicines for diabetes, like pioglitazone or rosiglitazone -certain medicines for anxiety or sleep -narcotic medicines for pain This list may not describe all possible interactions. Give your health care provider a list of all the medicines, herbs, non-prescription drugs, or dietary supplements you use. Also tell them if you smoke, drink alcohol, or use illegal drugs. Some items may interact with your medicine. What should I watch for while using this medicine? Tell your doctor or healthcare professional if your symptoms do not start to get better or if they get worse. Visit your doctor or health care professional for regular checks on your progress. Do not stop taking except on your doctor's  advice. You may develop a severe reaction. Your doctor will tell you how much medicine to take. Wear a medical identification bracelet or chain if you are taking this medicine for seizures, and carry a card that describes your disease and details of your medicine and dosage times. You may get drowsy or dizzy. Do not drive, use machinery, or do anything that needs mental alertness until you know how this medicine affects you. Do not stand or sit up quickly, especially if you are an older patient. This reduces the risk of dizzy or fainting spells. Alcohol may interfere with the effect of this medicine. Avoid alcoholic drinks. If you have a heart condition, like congestive heart failure, and notice that you are retaining water and have swelling in your hands or feet, contact your health care provider immediately. The use of this medicine may increase the chance of suicidal thoughts or actions. Pay special attention to how you are responding while on this medicine. Any worsening of mood, or thoughts of suicide or dying should be reported to your health care professional right away. This medicine has caused reduced sperm counts in some men. This may interfere with the ability to father a child. You should talk to your doctor or health care professional if you are concerned about your fertility. Women who become pregnant while using this medicine for seizures may enroll in the [place] Pregnancy Registry by calling [phone]. This registry collects information about the safety of antiepileptic drug use during pregnancy. What side effects may I notice from receiving this medicine? Side effects that you should report to your doctor or health care professional as soon as possible: -allergic reactions like skin rash, itching or hives, swelling of the face, lips, or tongue -breathing problems -changes in vision -chest pain -confusion -jerking or unusual movements of any part of your  body -loss of memory -muscle pain, tenderness, or weakness -suicidal thoughts or other mood changes -swelling of the ankles, feet, hands -unusual bruising or bleeding Side effects that usually do not require medical attention (report to your doctor or health care professional if they continue or are bothersome): -dizziness -drowsiness -dry mouth -headache -nausea -tremors -trouble sleeping -weight gain This list may not describe all possible side effects. Call your doctor  for medical advice about side effects. You may report side effects to FDA at 1-800-FDA-1088. Where should I keep my medicine? Keep out of the reach of children. This medicine can be abused. Keep your medicine in a safe place to protect it from theft. Do not share this medicine with anyone. Selling or giving away this medicine is dangerous and against the law. This medicine may cause accidental overdose and death if it taken by other adults, children, or pets. Mix any unused medicine with a substance like cat litter or coffee grounds. Then throw the medicine away in a sealed container like a sealed bag or a coffee can with a lid. Do not use the medicine after the expiration date. Store at room temperature between 15 and 30 degrees C (59 and 86 degrees F). NOTE: This sheet is a summary. It may not cover all possible information. If you have questions about this medicine, talk to your doctor, pharmacist, or health care provider.  2018 Elsevier/Gold Standard (2015-06-19 10:26:12)

## 2017-08-04 NOTE — Progress Notes (Signed)
[id number]    Provider:  Dr Jaynee Eagles Referring Provider: Vernie Shanks, MD Primary Care Physician:  Jacqueline Shanks, MD  CC:  Left leg pain and weakness  Interval history 08/04/2017: Since last being seen, she had swelling in the left leg. Redness, she was seeing the orthopaedist. Initially thought to be cellulitis, it cleared up a little with antibiotics. She finished PT at the home that we ordered. She is doing outpatient PT now. They also saw Dr. Jacelyn Grip and they discontinued a medication and she improved. I still recommend Podiatry for her left foot, but she had injections into the left foot from ortho. Recommend Orthopaedics treat the flat foot on the left possible structural causing pain. We called [facility], they called on the 5th and the 13th and could not reach patient. Our staff gave instructions on ow to call and order. Daughter provides much information. Reordered MRI lumbar and Brain.   No follow up will see what the the MRI lumbar spine shows before scheduling next steps.   Reviewed orthopedic notes.  She was diagnosed with bilateral knee pain with primary osteoarthritis and concern for degenerative meniscal tear, left forefoot pain with concern for possible Morton's neuroma involving at least the third and possibly the second intermetatarsal space that is post left hindfoot fusion in the 70s, pes planus with pronation, and possible lumbar radiculopathy with underlying lumbar degenerative disc disease with grade 1-2 L5-S1 anterior listhesis and probable SI joint dysfunction, she was given injections and was asked to follow through with follow-up, she was seen multiple times, was recommended AFO brace, was referred to outpatient physical therapy, she was treated with antibiotics for possible cellulitis.  HPI:  Jacqueline Foley is a 84 y.o. female here as a referral from Dr. Jacelyn Grip for leg pain. PMHx HTN, gait disturbance, anemia, left leg weakness, HLD,  osteoarthritis, chronic intractable headache, insomnia, anxiety. She has a history of insomnia. She has post polio syndrome. This past August she noticed weakness, new constant pain, waking her up in the middle of the night, she can't sleep. She is having difficulty getting out of bed, stiffness in the leg. She has low back pain, She also has neck pain and shooting pain down her left leg. She also has bad headaches.  She has numbness in her left arm. Her left toes throb. She has an electric shock down her leg and feels like the toes are going to "bust open". She has difficulty walking when she has the pain. No falls. She also gets cramping in the left leg. She also has a history of surgery in her foot on the left. Progressive, worsening. Weakness in the left arm and leg. She tried physical therapy and medical management with OTC medications and prescription medications from her primary care. PT did not help. No other focal neurologic deficits, associated symptoms, inciting events or modifiable factors.  Reviewed notes, labs and imaging from outside physicians, which showed:  Personally reviewed images of CT head 11/2015, unremarkable for age.   Reviewed labs, 03/02/2016  Unremarkable cbc, ferritin 8, CMP with   Review of Systems: Patient complains of symptoms per HPI as well as the following symptoms: Cough, allergies, runny nose, difficulty swallowing, dizziness, depression, anxiety. Pertinent negatives and positives per HPI. All others negative.   Social History   Socioeconomic History  . Marital status: Divorced    Spouse name: Not on file  . Number of children: 6  . Years of education: 50  . Highest  education level: Not on file  Social Needs  . Financial resource strain: Not on file  . Food insecurity - worry: Not on file  . Food insecurity - inability: Not on file  . Transportation needs - medical: Not on file  . Transportation needs - non-medical: Not on file  Occupational History  .  Not on file  Tobacco Use  . Smoking status: Never Smoker  . Smokeless tobacco: Never Used  Substance and Sexual Activity  . Alcohol use: Yes    Comment: wine during some holidays  . Drug use: No  . Sexual activity: Not on file  Other Topics Concern  . Not on file  Social History Narrative   Lives at home alone   Right handed   1 cup of caffeine daily    Family History  Problem Relation Age of Onset  . Diabetes Mother   . Heart disease Mother   . Hypertension Mother   . Prostate cancer Father     Past Medical History:  Diagnosis Date  . Chest pain at rest, negative MI, negative stress test, may have been GI 12/06/2013  . Diabetes mellitus without complication ([place])   . DOE (dyspnea on exertion), may be due to HTN 12/06/2013  . GERD (gastroesophageal reflux disease)   . H/O diabetes mellitus   . HTN (hypertension) 12/06/2013  . Hypercholesteremia   . Hypertension   . TIA (transient ischemic attack)     Past Surgical History:  Procedure Laterality Date  . CATARACT EXTRACTION, BILATERAL  2018  . EYE SURGERY    . FOOT SURGERY      Current Outpatient Medications  Medication Sig Dispense Refill  . aspirin EC 81 MG tablet Take 81 mg by mouth daily.    Marland Kitchen atorvastatin (LIPITOR) 20 MG tablet Take 10 mg by mouth daily.    . Biotin (BIOTIN MAXIMUM STRENGTH) 5 MG CAPS Take 1 capsule by mouth daily.    . Cholecalciferol (VITAMIN D3 PO) Take 1 tablet by mouth daily. 1000 units = 1 tablet    . fluticasone (FLONASE) 50 MCG/ACT nasal spray Place 1 spray into both nostrils daily.    . pantoprazole (PROTONIX) 40 MG tablet Take 40 mg by mouth 2 (two) times daily.   2  . albuterol (PROVENTIL HFA;VENTOLIN HFA) 108 (90 Base) MCG/ACT inhaler Inhale 2 puffs into the lungs every 6 (six) hours as needed for wheezing or shortness of breath.    . doxepin (SINEQUAN) 25 MG capsule Take 50 mg by mouth at bedtime. Take with food  3  . pregabalin (LYRICA) 75 MG capsule Take 1 capsule (75 mg total) by  mouth 2 (two) times daily. 60 capsule 3  . triamterene-hydrochlorothiazide (DYAZIDE) 37.5-25 MG capsule Take 1 capsule by mouth daily.     No current facility-administered medications for this visit.     Allergies as of 08/04/2017 - Review Complete 08/04/2017  Allergen Reaction Noted  . Tomato Swelling and Rash 12/06/2013    Vitals: BP 140/82 (BP Location: Right Arm, Patient Position: Sitting)   Pulse 83   Ht 5\' 4"  (1.626 m)   Wt 140 lb 6.4 oz (63.7 kg)   BMI 24.10 kg/m  Last Weight:  Wt Readings from Last 1 Encounters:  08/04/17 140 lb 6.4 oz (63.7 kg)   Last Height:   Ht Readings from Last 1 Encounters:  08/04/17 5\' 4"  (1.626 m)     Physical exam: Exam: Gen: NAD, conversant, well nourised, obese, well groomed  CV: RRR, no MRG. No Carotid Bruits. No peripheral edema, warm, nontender Eyes: Conjunctivae clear without exudates or hemorrhage MSK: Left flatttened arch  Neuro: Detailed Neurologic Exam  Speech:    Speech is normal; fluent and spontaneous with normal comprehension.  Cognition:    The patient is oriented to person, place, and time;     recent and remote memory intact;     language fluent;     normal attention, concentration,     fund of knowledge Cranial Nerves:    The pupils are equal, round, and reactive to light.Attempted fundoscopic exam could not visualize. Left eye impaired movements multiple surgeries( chronic),  right eye EOMI. Vision intact. Trigeminal sensation is intact and the muscles of mastication are normal. The face is symmetric. The palate elevates in the midline. Hearing intact. Voice is normal. Shoulder shrug is normal. The tongue has normal motion without fasciculations.   Coordination:    Normal finger to nose and heel to shin. Normal rapid alternating movements.   Gait:     Difficulty standing, antalgic, stooped  Motor Observation:    No asymmetry, no atrophy, and no involuntary movements noted. Tone:     Normal muscle tone.    Posture:    Stooped    Strength: left arm and leg prox weakness otherwise srength is V/V in the upper and lower limbs.      Sensation: intact to LT     Reflex Exam:  DTR's:    Absent left patellar otherwise brisk otherwise Deep tendon reflexes in the upper and lower extremities are brisk bilaterally.   Toes:    The toes are downgoing bilaterally.   Clonus:    Clonus is absent.   Assessment/Plan:  Emaan Gary is a 84 y.o. female here as a referral from Dr. Jacelyn Grip for leg pain. PMHx HTN, gait disturbance, anemia, left leg weakness, HLD, osteoarthritis, chronic intractable headache, insomnia, anxiety. She has a history of insomnia. She has post polio syndrome.  Patient reports weakness and constant pain in the left leg, difficulty getting out of bed, stiffness in the morning improved with walking and low back pain and shooting pain down the left leg.  Likely lumbar radiculopathy due to degenerative disc disease, need an MRI of the lumbar spine, patient also reports weakness in the left arm and leg, physical therapy did not help.  Need MRI of the brain to evaluate for stroke and MRI of the lumbar spine to evaluate for lumbar radiculopathy for surgical or other interventions.  Left flatttened arch: recommend Podiatrist Physical therapy home and fall risk assessment Needs a Education officer, museum as well at home Fall risk, discussed. Start 75mg  twice daily Lyrica  No follow up will see what the the MRI lumbar spine shows before scheduling next steps  Cc: Dr. Tye Maryland, MD  [facility] [address] [place], [place] [address]  Phone [phone] Fax [phone]  A total of 40 minutes was spent in with this patient and daughter face-to-face. Over half this time was spent on counseling patient on the lumbar radic, pes planus, back pain, foot pain diagnosis and different therapeutic options available. Spent extensive time  reviewing Dr. Arvil Persons notes, reviewing info from last appointment, pesplanus or plantar fasciitis, lumbar radiculopathy and needing MRi brain and lumbar spine for further evaluation.

## 2017-08-11 DIAGNOSIS — M1711 Unilateral primary osteoarthritis, right knee: Secondary | ICD-10-CM | POA: Diagnosis not present

## 2017-08-11 DIAGNOSIS — M1712 Unilateral primary osteoarthritis, left knee: Secondary | ICD-10-CM | POA: Diagnosis not present

## 2017-08-14 ENCOUNTER — Ambulatory Visit
Admission: RE | Admit: 2017-08-14 | Discharge: 2017-08-14 | Disposition: A | Discharge status: Home / Self Care | Payer: Medicare Other | Source: Ambulatory Visit | Attending: Neurology | Admitting: Neurology

## 2017-08-14 DIAGNOSIS — R269 Unspecified abnormalities of gait and mobility: Secondary | ICD-10-CM | POA: Diagnosis not present

## 2017-08-14 DIAGNOSIS — R29898 Other symptoms and signs involving the musculoskeletal system: Secondary | ICD-10-CM

## 2017-08-14 DIAGNOSIS — Z9181 History of falling: Secondary | ICD-10-CM

## 2017-08-14 DIAGNOSIS — M5416 Radiculopathy, lumbar region: Secondary | ICD-10-CM

## 2017-08-14 DIAGNOSIS — R531 Weakness: Secondary | ICD-10-CM

## 2017-08-16 ENCOUNTER — Telehealth: Payer: Self-pay | Admitting: *Deleted

## 2017-08-16 NOTE — Telephone Encounter (Signed)
Called pt (daughter's number, Cheri, on DPR) and LVM asking for call back. When she calls back, it is ok to tell her that the patient's MRI of her lumbar spine is unremarkable, no concerns.

## 2017-08-16 NOTE — Telephone Encounter (Signed)
-----   Message from Melvenia Beam, MD sent at 08/15/2017 12:12 PM EDT ----- MRI lumbar spine unremarkable thanks

## 2017-08-16 NOTE — Telephone Encounter (Signed)
Pts daughter returning RNs call aware that MRI is unremarkable

## 2017-08-22 ENCOUNTER — Telehealth: Payer: Self-pay | Admitting: Neurology

## 2017-08-22 NOTE — Telephone Encounter (Signed)
Called the number on file which was her daughter Carmel Sacramento that is listed on DPR, LVM for her to call back to discuss MRI results.

## 2017-08-22 NOTE — Telephone Encounter (Signed)
-----   Message from Melvenia Beam, MD sent at 08/19/2017 10:08 AM EDT ----- No acute changes on MRI, nothing new. 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 I recommend she take an aspirin 325 daily as long as she can tolerate it, she is on asa 81mg  right now but feel asa 325 would be better. If she has side effects likel bleeding she can go back to the 81mg . But nothing new, no new strokes or masses or lesions thanks.

## 2017-08-24 NOTE — Telephone Encounter (Signed)
Pt returned RN's call. She said her daughter is teaching today and won't be able to accept any calls. Please call the pt at [phone]

## 2017-08-24 NOTE — Telephone Encounter (Signed)
Called the patient back and went over the mri information with her. I informed her that Dr Jaynee Eagles recommended use of 325 mg asprin to help with preventing strokes as well as continuing to be compliant with her BP meds. Pt verbalized understanding.

## 2017-08-25 DIAGNOSIS — M1711 Unilateral primary osteoarthritis, right knee: Secondary | ICD-10-CM | POA: Diagnosis not present

## 2017-08-25 DIAGNOSIS — M1712 Unilateral primary osteoarthritis, left knee: Secondary | ICD-10-CM | POA: Diagnosis not present

## 2017-08-30 NOTE — Telephone Encounter (Signed)
Pt's daughter has called back and wanted to verify the results of the MRI of the pt. I went over them with her and that Dr Jaynee Eagles would like for her to take Asprin 325mg . Pt's daughter has gotten that for the patient and verbalized understanding.

## 2017-09-01 ENCOUNTER — Telehealth: Payer: Self-pay | Admitting: Neurology

## 2017-09-01 DIAGNOSIS — M1711 Unilateral primary osteoarthritis, right knee: Secondary | ICD-10-CM | POA: Diagnosis not present

## 2017-09-01 DIAGNOSIS — M1712 Unilateral primary osteoarthritis, left knee: Secondary | ICD-10-CM | POA: Diagnosis not present

## 2017-09-01 NOTE — Telephone Encounter (Signed)
That's fine, have her stop Lyrica and go back to gabapentin thanks

## 2017-09-01 NOTE — Telephone Encounter (Signed)
Called and spoke with pt's daughter Carmel Sacramento. She is aware that the patient had complained of dizziness and also said she was sleeping more than she wanted to at night. Discussed that Dr. Jaynee Eagles is ok with pt switching back to Gabapentin. Her last dose was Gabapentin 300 mg capsules, take 2 capsules daily at bedtime. Daughter aware that pt would need to stop the Lyrica and not take both together. Cheri stated that she was not really sure that the dizziness and wasn't sure this was caused by the Lyrica as the patient has had dizzy spells in the past prior to starting Lyrica. Also, the daughters are always getting onto the patient about not eating enough. She typically doesn't eat after 6 pm and now with her sleeping all night she does not usually get up until 8-10 AM. The daughter stated that the patient isn't home yet and she had PT today. She will talk to the patient tonight about this. RN informed Carmel Sacramento that she will have someone from the office call the patient tomorrow to discuss this medication change with the patient. Cheri was appreciative.

## 2017-09-01 NOTE — Telephone Encounter (Signed)
Pt states the pregabalin (LYRICA) 75 MG capsule makes her very dizzy and she wants to go back to the Gabapentin even though it doesn't help that much.

## 2017-09-02 DIAGNOSIS — M1711 Unilateral primary osteoarthritis, right knee: Secondary | ICD-10-CM | POA: Diagnosis not present

## 2017-09-02 DIAGNOSIS — M1712 Unilateral primary osteoarthritis, left knee: Secondary | ICD-10-CM | POA: Diagnosis not present

## 2017-09-02 NOTE — Telephone Encounter (Signed)
Called the patient and spoke with her about Dr Jaynee Eagles being ok with going back to the gabapentin. The patient already has some from where she took it before. I will take the lyrica off medication list and add this back but the patient doesn't need it sent in to the pharmacy at this time. I reviewed with the patient how she should take the medication and made her aware that daughter also understood what was changing as well. Pt was appreciative for the call and verbalized understanding.

## 2017-09-02 NOTE — Addendum Note (Signed)
Addended by: Darleen Crocker on: 09/02/2017 10:08 AM   Modules accepted: Orders

## 2017-09-08 DIAGNOSIS — M1712 Unilateral primary osteoarthritis, left knee: Secondary | ICD-10-CM | POA: Diagnosis not present

## 2017-09-08 DIAGNOSIS — M1711 Unilateral primary osteoarthritis, right knee: Secondary | ICD-10-CM | POA: Diagnosis not present

## 2017-09-20 DIAGNOSIS — M79672 Pain in left foot: Secondary | ICD-10-CM | POA: Diagnosis not present

## 2017-09-20 DIAGNOSIS — G5762 Lesion of plantar nerve, left lower limb: Secondary | ICD-10-CM | POA: Diagnosis not present

## 2017-09-29 DIAGNOSIS — M7989 Other specified soft tissue disorders: Secondary | ICD-10-CM | POA: Diagnosis not present

## 2017-09-29 DIAGNOSIS — M810 Age-related osteoporosis without current pathological fracture: Secondary | ICD-10-CM | POA: Diagnosis not present

## 2017-09-29 DIAGNOSIS — G14 Postpolio syndrome: Secondary | ICD-10-CM | POA: Diagnosis not present

## 2017-09-29 DIAGNOSIS — R269 Unspecified abnormalities of gait and mobility: Secondary | ICD-10-CM | POA: Diagnosis not present

## 2017-09-29 DIAGNOSIS — M159 Polyosteoarthritis, unspecified: Secondary | ICD-10-CM | POA: Diagnosis not present

## 2017-09-29 DIAGNOSIS — D649 Anemia, unspecified: Secondary | ICD-10-CM | POA: Diagnosis not present

## 2017-09-29 DIAGNOSIS — I10 Essential (primary) hypertension: Secondary | ICD-10-CM | POA: Diagnosis not present

## 2017-09-29 DIAGNOSIS — G4700 Insomnia, unspecified: Secondary | ICD-10-CM | POA: Diagnosis not present

## 2017-09-29 DIAGNOSIS — E785 Hyperlipidemia, unspecified: Secondary | ICD-10-CM | POA: Diagnosis not present

## 2017-09-29 DIAGNOSIS — K219 Gastro-esophageal reflux disease without esophagitis: Secondary | ICD-10-CM | POA: Diagnosis not present

## 2017-09-29 DIAGNOSIS — R29898 Other symptoms and signs involving the musculoskeletal system: Secondary | ICD-10-CM | POA: Diagnosis not present

## 2017-11-07 ENCOUNTER — Other Ambulatory Visit: Payer: Self-pay

## 2017-11-07 ENCOUNTER — Observation Stay (HOSPITAL_COMMUNITY)
Admission: EM | Admit: 2017-11-07 | Discharge: 2017-11-09 | Disposition: A | Discharge status: Home / Self Care | Payer: Medicare Other | Attending: Family Medicine | Admitting: Family Medicine

## 2017-11-07 ENCOUNTER — Emergency Department (HOSPITAL_COMMUNITY): Payer: Medicare Other

## 2017-11-07 ENCOUNTER — Encounter (HOSPITAL_COMMUNITY): Payer: Self-pay | Admitting: Emergency Medicine

## 2017-11-07 DIAGNOSIS — I10 Essential (primary) hypertension: Secondary | ICD-10-CM | POA: Diagnosis present

## 2017-11-07 DIAGNOSIS — R079 Chest pain, unspecified: Principal | ICD-10-CM | POA: Diagnosis present

## 2017-11-07 DIAGNOSIS — Z7982 Long term (current) use of aspirin: Secondary | ICD-10-CM | POA: Insufficient documentation

## 2017-11-07 DIAGNOSIS — R0609 Other forms of dyspnea: Secondary | ICD-10-CM | POA: Diagnosis not present

## 2017-11-07 DIAGNOSIS — Z8673 Personal history of transient ischemic attack (TIA), and cerebral infarction without residual deficits: Secondary | ICD-10-CM | POA: Diagnosis not present

## 2017-11-07 DIAGNOSIS — K219 Gastro-esophageal reflux disease without esophagitis: Secondary | ICD-10-CM | POA: Insufficient documentation

## 2017-11-07 DIAGNOSIS — E785 Hyperlipidemia, unspecified: Secondary | ICD-10-CM | POA: Diagnosis not present

## 2017-11-07 DIAGNOSIS — Z79899 Other long term (current) drug therapy: Secondary | ICD-10-CM | POA: Diagnosis not present

## 2017-11-07 DIAGNOSIS — E119 Type 2 diabetes mellitus without complications: Secondary | ICD-10-CM | POA: Diagnosis not present

## 2017-11-07 DIAGNOSIS — R0789 Other chest pain: Secondary | ICD-10-CM | POA: Diagnosis not present

## 2017-11-07 LAB — CBC
HCT: 37.1 % (ref 36.0–46.0)
Hemoglobin: 11.3 g/dL — ABNORMAL LOW (ref 12.0–15.0)
MCH: 25.9 pg — ABNORMAL LOW (ref 26.0–34.0)
MCHC: 30.5 g/dL (ref 30.0–36.0)
MCV: 85.1 fL (ref 78.0–100.0)
Platelets: 239 10*3/uL (ref 150–400)
RBC: 4.36 MIL/uL (ref 3.87–5.11)
RDW: 18.6 % — AB (ref 11.5–15.5)
WBC: 4.7 10*3/uL (ref 4.0–10.5)

## 2017-11-07 LAB — BASIC METABOLIC PANEL
ANION GAP: 6 (ref 5–15)
BUN: 15 mg/dL (ref 6–20)
CALCIUM: 9.1 mg/dL (ref 8.9–10.3)
CO2: 30 mmol/L (ref 22–32)
Chloride: 107 mmol/L (ref 101–111)
Creatinine, Ser: 0.98 mg/dL (ref 0.44–1.00)
GFR calc Af Amer: >60 mL/min (ref >60)
GFR, EST NON AFRICAN AMERICAN: 52 mL/min — AB (ref >60)
GLUCOSE: 93 mg/dL (ref 65–99)
POTASSIUM: 3.1 mmol/L — AB (ref 3.5–5.1)
Sodium: 143 mmol/L (ref 135–145)

## 2017-11-07 LAB — I-STAT TROPONIN, ED: TROPONIN I, POC: 0 ng/mL (ref 0.00–0.08)

## 2017-11-07 NOTE — ED Triage Notes (Signed)
Pt to ED with c/o left chest pain onset 2 weeks ago. Pt denies nausea or shortness of breath.  Pt st's at times pain radiates into left arm

## 2017-11-08 ENCOUNTER — Other Ambulatory Visit: Payer: Self-pay

## 2017-11-08 ENCOUNTER — Encounter (HOSPITAL_COMMUNITY): Payer: Self-pay | Admitting: Internal Medicine

## 2017-11-08 ENCOUNTER — Observation Stay (HOSPITAL_BASED_OUTPATIENT_CLINIC_OR_DEPARTMENT_OTHER): Payer: Medicare Other

## 2017-11-08 ENCOUNTER — Observation Stay (HOSPITAL_COMMUNITY): Payer: Medicare Other

## 2017-11-08 DIAGNOSIS — R0789 Other chest pain: Secondary | ICD-10-CM | POA: Diagnosis not present

## 2017-11-08 DIAGNOSIS — Z8673 Personal history of transient ischemic attack (TIA), and cerebral infarction without residual deficits: Secondary | ICD-10-CM | POA: Diagnosis not present

## 2017-11-08 DIAGNOSIS — E785 Hyperlipidemia, unspecified: Secondary | ICD-10-CM | POA: Diagnosis not present

## 2017-11-08 DIAGNOSIS — E119 Type 2 diabetes mellitus without complications: Secondary | ICD-10-CM | POA: Diagnosis not present

## 2017-11-08 DIAGNOSIS — I361 Nonrheumatic tricuspid (valve) insufficiency: Secondary | ICD-10-CM

## 2017-11-08 DIAGNOSIS — Z7982 Long term (current) use of aspirin: Secondary | ICD-10-CM | POA: Diagnosis not present

## 2017-11-08 DIAGNOSIS — Z79899 Other long term (current) drug therapy: Secondary | ICD-10-CM | POA: Diagnosis not present

## 2017-11-08 DIAGNOSIS — I10 Essential (primary) hypertension: Secondary | ICD-10-CM | POA: Diagnosis not present

## 2017-11-08 DIAGNOSIS — K219 Gastro-esophageal reflux disease without esophagitis: Secondary | ICD-10-CM | POA: Diagnosis not present

## 2017-11-08 DIAGNOSIS — R079 Chest pain, unspecified: Secondary | ICD-10-CM | POA: Diagnosis not present

## 2017-11-08 LAB — ECHOCARDIOGRAM COMPLETE
Height: 64 in
Weight: 2240 oz

## 2017-11-08 LAB — TROPONIN I

## 2017-11-08 LAB — CBC
HEMATOCRIT: 36.1 % (ref 36.0–46.0)
HEMOGLOBIN: 11 g/dL — AB (ref 12.0–15.0)
MCH: 25.5 pg — ABNORMAL LOW (ref 26.0–34.0)
MCHC: 30.5 g/dL (ref 30.0–36.0)
MCV: 83.8 fL (ref 78.0–100.0)
Platelets: 235 10*3/uL (ref 150–400)
RBC: 4.31 MIL/uL (ref 3.87–5.11)
RDW: 18.5 % — ABNORMAL HIGH (ref 11.5–15.5)
WBC: 5.2 10*3/uL (ref 4.0–10.5)

## 2017-11-08 LAB — CREATININE, SERUM
Creatinine, Ser: 0.99 mg/dL (ref 0.44–1.00)
GFR, EST AFRICAN AMERICAN: 60 mL/min — AB (ref >60)
GFR, EST NON AFRICAN AMERICAN: 52 mL/min — AB (ref >60)

## 2017-11-08 LAB — D-DIMER, QUANTITATIVE: D-Dimer, Quant: 2.29 ug/mL-FEU — ABNORMAL HIGH (ref 0.00–0.50)

## 2017-11-08 LAB — I-STAT TROPONIN, ED: Troponin i, poc: 0 ng/mL (ref 0.00–0.08)

## 2017-11-08 MED ORDER — IOPAMIDOL (ISOVUE-370) INJECTION 76%
100.0000 mL | Freq: Once | INTRAVENOUS | Status: AC
  Administered 2017-11-08: 80 mL via INTRAVENOUS

## 2017-11-08 MED ORDER — ALBUTEROL SULFATE (2.5 MG/3ML) 0.083% IN NEBU: 3.0000 mL | INHALATION_SOLUTION | Freq: Four times a day (QID) | RESPIRATORY_TRACT | Status: DC | PRN

## 2017-11-08 MED ORDER — ACETAMINOPHEN 325 MG PO TABS: 650.0000 mg | ORAL_TABLET | ORAL | Status: DC | PRN

## 2017-11-08 MED ORDER — AMLODIPINE BESYLATE 5 MG PO TABS
5.0000 mg | ORAL_TABLET | Freq: Every day | ORAL | Status: DC
  Administered 2017-11-08 – 2017-11-09 (×2): 5 mg via ORAL
  Filled 2017-11-08 (×2): qty 1

## 2017-11-08 MED ORDER — MORPHINE SULFATE (PF) 2 MG/ML IV SOLN
2.0000 mg | Freq: Once | INTRAVENOUS | Status: AC
  Administered 2017-11-08: 2 mg via INTRAVENOUS
  Filled 2017-11-08: qty 1

## 2017-11-08 MED ORDER — GABAPENTIN 300 MG PO CAPS
600.0000 mg | ORAL_CAPSULE | Freq: Every day | ORAL | Status: DC
  Filled 2017-11-08: qty 2

## 2017-11-08 MED ORDER — ATORVASTATIN CALCIUM 20 MG PO TABS
20.0000 mg | ORAL_TABLET | Freq: Every day | ORAL | Status: DC
  Administered 2017-11-08 – 2017-11-09 (×2): 20 mg via ORAL
  Filled 2017-11-08 (×2): qty 1

## 2017-11-08 MED ORDER — FERROUS GLUCONATE 324 (38 FE) MG PO TABS
324.0000 mg | ORAL_TABLET | Freq: Every day | ORAL | Status: DC
  Administered 2017-11-08 – 2017-11-09 (×2): 324 mg via ORAL
  Filled 2017-11-08 (×2): qty 1

## 2017-11-08 MED ORDER — IOPAMIDOL (ISOVUE-370) INJECTION 76%
INTRAVENOUS | Status: AC
  Filled 2017-11-08: qty 100

## 2017-11-08 MED ORDER — POTASSIUM CHLORIDE CRYS ER 20 MEQ PO TBCR
40.0000 meq | EXTENDED_RELEASE_TABLET | Freq: Once | ORAL | Status: AC
  Administered 2017-11-08: 40 meq via ORAL
  Filled 2017-11-08: qty 2

## 2017-11-08 MED ORDER — PANTOPRAZOLE SODIUM 40 MG PO TBEC
40.0000 mg | DELAYED_RELEASE_TABLET | Freq: Every day | ORAL | Status: DC
  Administered 2017-11-08 – 2017-11-09 (×2): 40 mg via ORAL
  Filled 2017-11-08 (×2): qty 1

## 2017-11-08 MED ORDER — ASPIRIN EC 325 MG PO TBEC
325.0000 mg | DELAYED_RELEASE_TABLET | Freq: Every day | ORAL | Status: DC
  Administered 2017-11-08 – 2017-11-09 (×2): 325 mg via ORAL
  Filled 2017-11-08 (×2): qty 1

## 2017-11-08 MED ORDER — ONDANSETRON HCL 4 MG/2ML IJ SOLN
4.0000 mg | Freq: Four times a day (QID) | INTRAMUSCULAR | Status: DC | PRN
  Administered 2017-11-08: 4 mg via INTRAVENOUS
  Filled 2017-11-08: qty 2

## 2017-11-08 MED ORDER — FLUTICASONE PROPIONATE 50 MCG/ACT NA SUSP
1.0000 | Freq: Every day | NASAL | Status: DC
  Administered 2017-11-08: 1 via NASAL
  Filled 2017-11-08: qty 16

## 2017-11-08 MED ORDER — ENOXAPARIN SODIUM 40 MG/0.4ML ~~LOC~~ SOLN
40.0000 mg | SUBCUTANEOUS | Status: DC
  Administered 2017-11-08 – 2017-11-09 (×2): 40 mg via SUBCUTANEOUS
  Filled 2017-11-08 (×2): qty 0.4

## 2017-11-08 MED ORDER — IOPAMIDOL (ISOVUE-370) INJECTION 76%: 100.0000 mL | Freq: Once | INTRAVENOUS | Status: DC | PRN

## 2017-11-08 NOTE — ED Notes (Signed)
Jacqueline Foley, SW, giving pt info pt had requested for assistance at home.

## 2017-11-08 NOTE — ED Notes (Signed)
MD paged and notified about positive dimer

## 2017-11-08 NOTE — ED Notes (Signed)
Crystal, CM, in w/pt.

## 2017-11-08 NOTE — ED Provider Notes (Signed)
0304 Attempted to see patient, but not in room.  Nursing staff working on flipping the room.   Montine Circle, PA-C 11/08/17 1552    Ezequiel Essex, MD 11/08/17 [phone]

## 2017-11-08 NOTE — H&P (Signed)
History and Physical    Hamna Asa QXI:[id number] DOB: [birth year] DOA: 11/07/2017  PCP: Vernie Shanks, MD  Patient coming from: Home.  Chief Complaint: Chest pain.  HPI: Jacqueline Foley is a 84 y.o. female with history of hypertension, TIA and hyperlipidemia presents with complaints of chest pain.  Patient has been having chest pain off and on for last 2 weeks.  Pain is mostly in the left anterior chest wall last for few minutes each time and increases on patient moving around.  The chest pain sometimes radiates to left arm.  Denies any nausea vomiting abdominal pain or diarrhea.  Has benign chronic cough.  Has had stress test in 2015 which was unremarkable.  ED Course: EKG shows nonspecific findings troponin was negative.  Chest x-ray unremarkable.  Patient admitted for further work-up of chest pain.  Review of Systems: As per HPI, rest all negative.   Past Medical History:  Diagnosis Date  . Chest pain at rest, negative MI, negative stress test, may have been GI 12/06/2013  . Diabetes mellitus without complication ([place])   . DOE (dyspnea on exertion), may be due to HTN 12/06/2013  . GERD (gastroesophageal reflux disease)   . H/O diabetes mellitus   . HTN (hypertension) 12/06/2013  . Hypercholesteremia   . Hypertension   . TIA (transient ischemic attack)     Past Surgical History:  Procedure Laterality Date  . CATARACT EXTRACTION, BILATERAL  2018  . EYE SURGERY    . FOOT SURGERY       reports that she has never smoked. She has never used smokeless tobacco. She reports that she drinks alcohol. She reports that she does not use drugs.  Allergies  Allergen Reactions  . Tomato Swelling and Rash    Family History  Problem Relation Age of Onset  . Diabetes Mother   . Heart disease Mother   . Hypertension Mother   . Prostate cancer Father     Prior to Admission medications   Medication Sig Start Date End Date Taking? Authorizing Provider  albuterol (PROVENTIL  HFA;VENTOLIN HFA) 108 (90 Base) MCG/ACT inhaler Inhale 2 puffs into the lungs every 6 (six) hours as needed for wheezing or shortness of breath.   Yes [provider]  amLODipine (NORVASC) 5 MG tablet Take 5 mg by mouth daily.   Yes [provider]  aspirin EC 81 MG tablet Take 325 mg by mouth daily.    Yes [provider]  atorvastatin (LIPITOR) 20 MG tablet Take 20 mg by mouth daily.    Yes [provider]  Cholecalciferol (VITAMIN D3 PO) Take 1 tablet by mouth daily. 1000 units = 1 tablet   Yes [provider]  ferrous gluconate (FERGON) 324 MG tablet Take 324 mg by mouth daily with breakfast.   Yes [provider]  ferrous sulfate 325 (65 FE) MG tablet Take 325 mg by mouth daily with breakfast.   Yes [provider]  fluticasone (FLONASE) 50 MCG/ACT nasal spray Place 1 spray into both nostrils daily.   Yes [provider]  gabapentin (NEURONTIN) 300 MG capsule Take 600 mg by mouth at bedtime.   Yes [provider]  MEGARED OMEGA-3 KRILL OIL 500 MG CAPS Take 1 capsule by mouth daily.   Yes [provider]  pantoprazole (PROTONIX) 40 MG tablet Take 40 mg by mouth daily.  02/07/17  Yes [provider]  Biotin (BIOTIN MAXIMUM STRENGTH) 5 MG CAPS Take 1 capsule by mouth daily.  [provider]  doxepin (SINEQUAN) 25 MG capsule Take 50 mg by mouth at bedtime. Take with food 03/09/17   [provider]  triamterene-hydrochlorothiazide (DYAZIDE) 37.5-25 MG capsule Take 1 capsule by mouth daily.    [provider]    Physical Exam: Vitals:   11/08/17 0500 11/08/17 0515 11/08/17 0530 11/08/17 0545  BP: (!) 142/78 (!) 151/83 137/76 139/82  Pulse:      Resp: 14 16 (!) 9 20  Temp:      TempSrc:      SpO2:      Weight:      Height:          Constitutional: Moderately built and nourished. Vitals:   11/08/17 0500 11/08/17 0515 11/08/17 0530 11/08/17 0545  BP: (!) 142/78  (!) 151/83 137/76 139/82  Pulse:      Resp: 14 16 (!) 9 20  Temp:      TempSrc:      SpO2:      Weight:      Height:       Eyes: Anicteric no pallor. ENMT: No discharge from the ears eyes nose or mouth. Neck: No mass felt.  No neck rigidity.  No JVD appreciated. Respiratory: No rhonchi or crepitations. Cardiovascular: S1-S2 heard no murmurs appreciated. Abdomen: Soft nontender bowel sounds present. Musculoskeletal: No edema. Skin: No rash. Neurologic: Alert awake oriented to time place and person.  Moves all extremities 5 x 5. Psychiatric: Appears normal.  Normal affect.   Labs on Admission: I have personally reviewed following labs and imaging studies  CBC: Recent Labs  Lab 11/07/17 1859  WBC 4.7  HGB 11.3*  HCT 37.1  MCV 85.1  PLT 315   Basic Metabolic Panel: Recent Labs  Lab 11/07/17 1859  NA 143  K 3.1*  CL 107  CO2 30  GLUCOSE 93  BUN 15  CREATININE 0.98  CALCIUM 9.1   GFR: Estimated Creatinine Clearance: 38.2 mL/min (by C-G formula based on SCr of 0.98 mg/dL). Liver Function Tests: No results for input(s): AST, ALT, ALKPHOS, BILITOT, PROT, ALBUMIN in the last 168 hours. No results for input(s): LIPASE, AMYLASE in the last 168 hours. No results for input(s): AMMONIA in the last 168 hours. Coagulation Profile: No results for input(s): INR, PROTIME in the last 168 hours. Cardiac Enzymes: No results for input(s): CKTOTAL, CKMB, CKMBINDEX, TROPONINI in the last 168 hours. BNP (last 3 results) No results for input(s): PROBNP in the last 8760 hours. HbA1C: No results for input(s): HGBA1C in the last 72 hours. CBG: No results for input(s): GLUCAP in the last 168 hours. Lipid Profile: No results for input(s): CHOL, HDL, LDLCALC, TRIG, CHOLHDL, LDLDIRECT in the last 72 hours. Thyroid Function Tests: No results for input(s): TSH, T4TOTAL, FREET4, T3FREE, THYROIDAB in the last 72 hours. Anemia Panel: No results for input(s): VITAMINB12, FOLATE, FERRITIN,  TIBC, IRON, RETICCTPCT in the last 72 hours. Urine analysis: No results found for: COLORURINE, APPEARANCEUR, LABSPEC, PHURINE, GLUCOSEU, HGBUR, BILIRUBINUR, KETONESUR, PROTEINUR, UROBILINOGEN, NITRITE, LEUKOCYTESUR Sepsis Labs: @LABRCNTIP (procalcitonin:4,lacticidven:4) )No results found for this or any previous visit (from the past 240 hour(s)).   Radiological Exams on Admission: Dg Chest 2 View  Result Date: 11/07/2017 CLINICAL DATA:  Left-sided chest pressure ongoing for 2 weeks EXAM: CHEST - 2 VIEW COMPARISON:  12/06/2013 FINDINGS: The heart size and mediastinal contours are within normal limits. Slight uncoiling of the atherosclerotic aorta. No aneurysm is noted. Minimal subpleural atelectasis and/or scarring at the left lung base. No pulmonary  consolidation or CHF. No pneumothorax nor effusion. Mild thoracolumbar spondylosis. IMPRESSION: No active cardiopulmonary disease. Electronically Signed   By: Ashley Royalty M.D.   On: 11/07/2017 19:52    EKG: Independently reviewed.  Normal sinus rhythm.  Assessment/Plan Principal Problem:   Chest pain Active Problems:   HTN (hypertension)    1. Chest pain -patient has history of hypertension hyperlipidemia TIA and age as risk factors.  We will cycle cardiac markers check d-dimer check 2D echo.  Will keep patient n.p.o. in anticipation of possible cardiac procedure.  Patient has had unremarkable stress test in 2015. 2. Hypertension -we will continue amlodipine. 3. Hyperlipidemia on statins. 4. History of TIA on statins and aspirin. 5. Iron deficiency anemia on iron supplements.   DVT prophylaxis: Lovenox. Code Status: Full code. Family Communication: Family at the bedside. Disposition Plan: Home. Consults called: None. Admission status: Observation.   Rise Patience MD [facility] Pager [phone].  If 7PM-7AM, please contact night-coverage [email or web address] Password [email or web address]  11/08/2017, 7:02 AM

## 2017-11-08 NOTE — Care Management Note (Signed)
Case Management Note  Patient Details  Name: Jacqueline Foley MRN: [id number] Date of Birth: 27-Aug-1932  Subjective/Objective:                  Chest Pain  Action/Plan: ED CM was asked to call the patient's daughter due to concerns about the patient's observation status. CM spoke with the patient at the bedside. Patient provided verbal permission to contact her daughter, Jacqueline Foley to discuss her PHI. CM also explained Observation status to the patient. The patient states she lives in a home alone. She is able to prepare her own meals. Her daughter checks on her and is able to assist her as needed. She does not currently have any home health services. Patient states she would like more information about senior services because she "needs to get out" to do things and would like transportation resources. She used [facility] in the past but states [place] CSW made aware and will provide the patient with resources.   ED CM called Jacqueline Foley and explained the patient is being admitted as an observation patient. Explained Medicare pays under Part B instead of Part A when a patient is observation. Jacqueline Foley states she understands her mother will have some out-of-pocket costs they were concerns due to her being in the ED since yesterday awaiting a bed. Jacqueline Foley is aware her mother is being moved upstairs to a room and is agreeable. Provided her with her mother's room number. She states she appreciates the call back.   Expected Discharge Date:   unknown            Expected Discharge Plan:  Home/Self Care  In-House Referral:  Clinical Social Work  Discharge planning Services  CM Consult  Post Acute Care Choice:    Choice offered to:     DME Arranged:    DME Agency:     HH Arranged:    [place] Agency:     Status of Service:  Completed, signed off  If discussed at [employer], dates discussed:    Additional Comments:  Apolonio Schneiders, RN 11/08/2017, 6:03 PM

## 2017-11-08 NOTE — ED Notes (Signed)
Pt lying on stretcher - c/o right foot pain - declined Neurontin - heat pack applied for comfort as requested. Monitor intact to pt. Pt alert, oriented. Daughter at bedside. Both aware pt NPO until last Troponin is resulted.

## 2017-11-08 NOTE — ED Notes (Signed)
Dinner order called to [facility]

## 2017-11-08 NOTE — Progress Notes (Signed)
  Echocardiogram 2D Echocardiogram has been performed.  Jacqueline Foley 11/08/2017, 4:36 PM

## 2017-11-08 NOTE — ED Notes (Addendum)
RN called and patient on schedule to be seen; pt complaining of right foot pain- missed gabapentin dose last night. RN called pharmacist to discuss.

## 2017-11-08 NOTE — ED Notes (Signed)
Heart and vascular lab notified regarding Echocardiogram

## 2017-11-08 NOTE — ED Notes (Signed)
Pt ambulated to restroom without distress.  

## 2017-11-08 NOTE — ED Notes (Signed)
Pt to CT

## 2017-11-08 NOTE — ED Notes (Addendum)
Jacqueline Foley, daughter, called and is concerned d/t pt being admitted under "Observation" status rather than "Admission". Merleen Nicely, SW, will request for [place], CM, come speak w/pt and daughter.

## 2017-11-08 NOTE — ED Notes (Signed)
Daughter -- Landis Martins --- [phone] --PLEASE CALL WITH ANY QUESTIONS/CONCERNS

## 2017-11-08 NOTE — ED Provider Notes (Signed)
[facility] EMERGENCY DEPARTMENT Provider Note   CSN: [id number] Arrival date & time: 11/07/17  1841     History   Chief Complaint Chief Complaint  Patient presents with  . Chest Pain    HPI Jacqueline Foley is a 84 y.o. female.  Patient sent from walk-in clinic with intermittent left-sided chest pain for the past 2 weeks.  States she just told her family about it several days ago.  She describes left-sided chest pain underneath her left breast that radiates to her left shoulder associated with shortness of breath, nausea and dizziness.  Symptoms come and go multiple times a day.  She does have some symptoms with exertion.  She is even taking old nitroglycerin which she had at home with partial relief.  Symptoms last may be 3 to 5 minutes at a time.  She gets these episodes several times a day.  She was told to come to the ED by [facility] walk-in clinic.  Denies any cardiac history.  Her last stress test was many years ago.  Does have a history of hypertension, hyperlipidemia and TIA.  She is having no chest pain at this time.  The history is provided by the patient and a relative.  Chest Pain   Associated symptoms include dizziness and shortness of breath. Pertinent negatives include no abdominal pain, no fever, no nausea, no vomiting and no weakness.    Past Medical History:  Diagnosis Date  . Chest pain at rest, negative MI, negative stress test, may have been GI 12/06/2013  . Diabetes mellitus without complication ([place])   . DOE (dyspnea on exertion), may be due to HTN 12/06/2013  . GERD (gastroesophageal reflux disease)   . H/O diabetes mellitus   . HTN (hypertension) 12/06/2013  . Hypercholesteremia   . Hypertension   . TIA (transient ischemic attack)     Patient Active Problem List   Diagnosis Date Noted  . Lumbar radiculopathy 05/02/2017  . Precordial chest pain 12/06/2013  . Chest pain at rest, negative MI, negative stress test, may have been GI 12/06/2013    . HTN (hypertension) 12/06/2013  . DOE (dyspnea on exertion), may be due to HTN 12/06/2013    Past Surgical History:  Procedure Laterality Date  . CATARACT EXTRACTION, BILATERAL  2018  . EYE SURGERY    . FOOT SURGERY       OB History   None      Home Medications    Prior to Admission medications   Medication Sig Start Date End Date Taking? Authorizing Provider  albuterol (PROVENTIL HFA;VENTOLIN HFA) 108 (90 Base) MCG/ACT inhaler Inhale 2 puffs into the lungs every 6 (six) hours as needed for wheezing or shortness of breath.    [provider]  aspirin EC 81 MG tablet Take 81 mg by mouth daily.    [provider]  atorvastatin (LIPITOR) 20 MG tablet Take 10 mg by mouth daily.    [provider]  Biotin (BIOTIN MAXIMUM STRENGTH) 5 MG CAPS Take 1 capsule by mouth daily.    [provider]  Cholecalciferol (VITAMIN D3 PO) Take 1 tablet by mouth daily. 1000 units = 1 tablet    [provider]  doxepin (SINEQUAN) 25 MG capsule Take 50 mg by mouth at bedtime. Take with food 03/09/17   [provider]  fluticasone (FLONASE) 50 MCG/ACT nasal spray Place 1 spray into both nostrils daily.    [provider]  gabapentin (NEURONTIN) 300 MG capsule Take  600 mg by mouth at bedtime.    [provider]  pantoprazole (PROTONIX) 40 MG tablet Take 40 mg by mouth 2 (two) times daily.  02/07/17   [provider]  triamterene-hydrochlorothiazide (DYAZIDE) 37.5-25 MG capsule Take 1 capsule by mouth daily.    [provider]    Family History Family History  Problem Relation Age of Onset  . Diabetes Mother   . Heart disease Mother   . Hypertension Mother   . Prostate cancer Father     Social History Social History   Tobacco Use  . Smoking status: Never Smoker  . Smokeless tobacco: Never Used  Substance Use Topics  . Alcohol use: Yes    Comment: wine during some holidays  . Drug use: No      Allergies   Tomato   Review of Systems Review of Systems  Constitutional: Negative for activity change, appetite change and fever.  HENT: Negative for congestion and rhinorrhea.   Eyes: Negative for visual disturbance.  Respiratory: Positive for chest tightness and shortness of breath.   Cardiovascular: Positive for chest pain.  Gastrointestinal: Negative for abdominal pain, nausea and vomiting.  Genitourinary: Negative for dysuria, hematuria, vaginal bleeding and vaginal discharge.  Musculoskeletal: Negative for arthralgias and myalgias.  Skin: Negative for rash.  Neurological: Positive for dizziness and light-headedness. Negative for weakness.   all other systems are negative except as noted in the HPI and PMH.     Physical Exam Updated Vital Signs BP (!) 152/80   Pulse 74   Temp 98.2 F (36.8 C) (Oral)   Resp 19   Ht 5\' 4"  (1.626 m)   Wt 63.5 kg (140 lb)   SpO2 100%   BMI 24.03 kg/m   Physical Exam  Constitutional: She is oriented to person, place, and time. She appears well-developed and well-nourished. No distress.  HENT:  Head: Normocephalic and atraumatic.  Mouth/Throat: Oropharynx is clear and moist. No oropharyngeal exudate.  Eyes: Pupils are equal, round, and reactive to light. Conjunctivae and EOM are normal.  Neck: Normal range of motion. Neck supple.  No meningismus.  Cardiovascular: Normal rate, regular rhythm, normal heart sounds and intact distal pulses.  No murmur heard. Pulmonary/Chest: Effort normal and breath sounds normal. No respiratory distress. She exhibits no tenderness.  Abdominal: Soft. There is no tenderness. There is no rebound and no guarding.  Musculoskeletal: Normal range of motion. She exhibits no edema or tenderness.  Neurological: She is alert and oriented to person, place, and time. No cranial nerve deficit. She exhibits normal muscle tone. Coordination normal.   5/5 strength throughout. CN 2-12 intact.Equal grip strength.    Skin: Skin is warm. Capillary refill takes less than 2 seconds.  Psychiatric: She has a normal mood and affect. Her behavior is normal.  Nursing note and vitals reviewed.    ED Treatments / Results  Labs (all labs ordered are listed, but only abnormal results are displayed) Labs Reviewed  BASIC METABOLIC PANEL - Abnormal; Notable for the following components:      Result Value   Potassium 3.1 (*)    GFR calc non Af Amer 52 (*)    All other components within normal limits  CBC - Abnormal; Notable for the following components:   Hemoglobin 11.3 (*)    MCH 25.9 (*)    RDW 18.6 (*)    All other components within normal limits  I-STAT TROPONIN, ED  I-STAT TROPONIN, ED    EKG EKG Interpretation  Date/Time:  Tuesday November 08 2017 03:02:59 EDT Ventricular Rate:  79 PR Interval:  146 QRS Duration: 96 QT Interval:  400 QTC Calculation: 458 R Axis:   -19 Text Interpretation:  Normal sinus rhythm Minimal voltage criteria for LVH, may be normal variant Nonspecific ST abnormality Abnormal ECG No significant change was found Confirmed by Ezequiel Essex [id number]) on 11/08/2017 3:52:28 AM   Radiology Dg Chest 2 View  Result Date: 11/07/2017 CLINICAL DATA:  Left-sided chest pressure ongoing for 2 weeks EXAM: CHEST - 2 VIEW COMPARISON:  12/06/2013 FINDINGS: The heart size and mediastinal contours are within normal limits. Slight uncoiling of the atherosclerotic aorta. No aneurysm is noted. Minimal subpleural atelectasis and/or scarring at the left lung base. No pulmonary consolidation or CHF. No pneumothorax nor effusion. Mild thoracolumbar spondylosis. IMPRESSION: No active cardiopulmonary disease. Electronically Signed   By: Ashley Royalty M.D.   On: 11/07/2017 19:52    Procedures Procedures (including critical care time)  Medications Ordered in ED Medications - No data to display   Initial Impression / Assessment and Plan / ED Course  I have reviewed the triage vital signs and the  nursing notes.  Pertinent labs & imaging results that were available during my care of the patient were reviewed by me and considered in my medical decision making (see chart for details).    Intermittent chest pain x2 weeks. Some typical or atypical features. Vitals stable.  EKG nonischemic. Troponin negative x2 in the ED.  low suspicion for PE or aortic dissection.   Heart score is 5.  Patient has no chest pain currently.  Her description of pain has both atypical and typical features.  She is agreeable to observation admission.  Discussed with Dr. Hal Hope.  Final Clinical Impressions(s) / ED Diagnoses   Final diagnoses:  Nonspecific chest pain    ED Discharge Orders    None       Bernis Schreur, Annie Main, MD 11/08/17 [phone]

## 2017-11-08 NOTE — Progress Notes (Signed)
Jacqueline Foley is an 84 year old female with history of a TIA, hypertension, hyperlipidemia who presented with complaints of chest pain.  Intermittently for the past 2 weeks.  Admitted for chest pain rule out ACS.  First set of troponin negative.  Awaiting troponin x2.  Personally reviewed EKG unremarkable for any ST-T elevations.  Positive d-dimer.  Ordererd stat CTA PE which is pending at the time of this dictation.  Please refer to H&P dictated by Dr. Hal Hope on 11/08/2017 for further details of the assessment and plan.

## 2017-11-08 NOTE — ED Notes (Signed)
Pt requesting medicine for a headache and to see if she can get a dose of her normal night time medications.

## 2017-11-08 NOTE — ED Notes (Signed)
Pt returned from CT °

## 2017-11-08 NOTE — ED Notes (Signed)
PAGED DR. HALL TO Jacqlyn Larsen, RN

## 2017-11-08 NOTE — ED Notes (Signed)
Vascular Tech in w/pt.  

## 2017-11-08 NOTE — ED Notes (Signed)
Pt complaining of nausea- educated her likely due to NPO status and iron pill.

## 2017-11-08 NOTE — ED Notes (Signed)
Echo completed. Pt alert, lying on stretcher - warm blankets applied for comfort. Monitor remains intact to pt. Pt's daughter leaving at this time and advised will return.

## 2017-11-08 NOTE — Clinical Social Work Note (Signed)
Clinical Social Work Assessment  Patient Details  Name: Jacqueline Foley MRN: [id number] Date of Birth: September 20, 1932  Date of referral:  11/08/17               Reason for consult:  [employer]                Permission sought to share information with:  Case Freight forwarder, Family Supports Permission granted to share information::  Yes, Verbal Permission Granted  Name::     Children, Electrical engineer::     Relationship::     Contact Information:     Housing/Transportation Living arrangements for the past 2 months:  Apartment Source of Information:  Patient Patient Interpreter Needed:  None Criminal Activity/Legal Involvement Pertinent to Current Situation/Hospitalization:    Significant Relationships:  Adult Children, Other Family Members Lives with:  Self Do you feel safe going back to the place where you live?  Yes Need for family participation in patient care:  Yes (Comment)  Care giving concerns:  CSW consulted for resources for pt in the community.   Social Worker assessment / plan:  CSW met with pt at pt's bedside in the ED. CSW provided pt with resources for seniors in the community. Pt's main concern is transportation. Pt does not want to rely on family members for rides to doctor's appointments. Pt approved for [facility], but [facility] does not come out to where pt is living. CSW provided pt with [facility] in [place]. Encouraged pt to reach out to see what additional services [facility] may be aware of. CSW will update pt of additional transportation options for her.   Employment status:  Retired Forensic scientist:  Medicare PT Recommendations:  Not assessed at this time [place] / Referral to community resources:  Adult Herbalist, Other (Comment Required), [facility] [place])  Patient/Family's Response to care:  Pt agreeable to plan of care. Pt is being admitted under observations status.   Patient/Family's  Understanding of and Emotional Response to Diagnosis, Current Treatment, and Prognosis:  Pt is understanding of current treatment. Pt thanked CSW for time.   Emotional Assessment Appearance:  Appears stated age Attitude/Demeanor/Rapport:    Affect (typically observed):  Pleasant, Accepting, Calm, Happy Orientation:  Oriented to Place, Oriented to Self, Oriented to  Time, Oriented to Situation Alcohol / Substance use:    Psych involvement (Current and /or in the community):  No (Comment)  Discharge Needs  Concerns to be addressed:  No discharge needs identified Readmission within the last 30 days:  No Current discharge risk:  Lives alone Barriers to Discharge:  Continued Medical Work up   [employer], LCSW 11/08/2017, 6:19 PM

## 2017-11-09 ENCOUNTER — Observation Stay (HOSPITAL_BASED_OUTPATIENT_CLINIC_OR_DEPARTMENT_OTHER): Payer: Medicare Other

## 2017-11-09 ENCOUNTER — Encounter (HOSPITAL_COMMUNITY): Payer: Self-pay | Admitting: General Practice

## 2017-11-09 DIAGNOSIS — I249 Acute ischemic heart disease, unspecified: Secondary | ICD-10-CM | POA: Diagnosis not present

## 2017-11-09 DIAGNOSIS — R079 Chest pain, unspecified: Secondary | ICD-10-CM

## 2017-11-09 DIAGNOSIS — I10 Essential (primary) hypertension: Secondary | ICD-10-CM | POA: Diagnosis not present

## 2017-11-09 LAB — BASIC METABOLIC PANEL
Anion gap: 7 (ref 5–15)
BUN: 14 mg/dL (ref 6–20)
CO2: 29 mmol/L (ref 22–32)
Calcium: 9 mg/dL (ref 8.9–10.3)
Chloride: 107 mmol/L (ref 101–111)
Creatinine, Ser: 0.97 mg/dL (ref 0.44–1.00)
GFR calc Af Amer: >60 mL/min (ref >60)
GFR, EST NON AFRICAN AMERICAN: 53 mL/min — AB (ref >60)
GLUCOSE: 97 mg/dL (ref 65–99)
POTASSIUM: 3.9 mmol/L (ref 3.5–5.1)
Sodium: 143 mmol/L (ref 135–145)

## 2017-11-09 LAB — NM MYOCAR MULTI W/SPECT W/WALL MOTION / EF
CSEPED: 0 min
CSEPEDS: 0 s
CSEPEW: 1 METS
CSEPHR: 78 %
MPHR: 138 {beats}/min
Peak HR: 108 {beats}/min
RPE: 0
Rest HR: 73 {beats}/min

## 2017-11-09 LAB — CBC
HCT: 34.7 % — ABNORMAL LOW (ref 36.0–46.0)
Hemoglobin: 10.7 g/dL — ABNORMAL LOW (ref 12.0–15.0)
MCH: 26.2 pg (ref 26.0–34.0)
MCHC: 30.8 g/dL (ref 30.0–36.0)
MCV: 85 fL (ref 78.0–100.0)
PLATELETS: 219 10*3/uL (ref 150–400)
RBC: 4.08 MIL/uL (ref 3.87–5.11)
RDW: 18.7 % — ABNORMAL HIGH (ref 11.5–15.5)
WBC: 5 10*3/uL (ref 4.0–10.5)

## 2017-11-09 MED ORDER — TECHNETIUM TC 99M TETROFOSMIN IV KIT
30.0000 | PACK | Freq: Once | INTRAVENOUS | Status: AC | PRN
  Administered 2017-11-09: 30 via INTRAVENOUS

## 2017-11-09 MED ORDER — REGADENOSON 0.4 MG/5ML IV SOLN
INTRAVENOUS | Status: AC
  Filled 2017-11-09: qty 5

## 2017-11-09 MED ORDER — TECHNETIUM TC 99M TETROFOSMIN IV KIT
10.0000 | PACK | Freq: Once | INTRAVENOUS | Status: AC | PRN
  Administered 2017-11-09: 10 via INTRAVENOUS

## 2017-11-09 MED ORDER — REGADENOSON 0.4 MG/5ML IV SOLN
0.4000 mg | Freq: Once | INTRAVENOUS | Status: AC
  Administered 2017-11-09: 0.4 mg via INTRAVENOUS
  Filled 2017-11-09: qty 5

## 2017-11-09 NOTE — Care Management Note (Signed)
Case Management Note  Patient Details  Name: Avianna Moynahan MRN: [id number] Date of Birth: 1932-06-02  Subjective/Objective:   Chest pain                 Action/Plan: NCM spoke to pt and lives alone but independent at home. Her dtrs assist her at home as needed. Pt has a cane. No NCM needs identified.    Expected Discharge Date:  11/09/17               Expected Discharge Plan:  Home/Self Care  In-House Referral:  Clinical Social Work  Discharge planning Services  CM Consult  Post Acute Care Choice:    Choice offered to:  NA  DME Arranged:  N/A DME Agency:  NA  HH Arranged:  NA HH Agency:  NA  Status of Service:  Completed, signed off  If discussed at [employer] of [place] Meetings, dates discussed:    Additional Comments:  Erenest Rasher, RN 11/09/2017, 4:43 PM

## 2017-11-09 NOTE — Consult Note (Signed)
Cardiology Consultation:   Patient ID: Jacqueline Foley; [id number]; 1932/09/23   Admit date: 11/07/2017 Date of Consult: 11/09/2017  Primary Care Provider: Vernie Shanks, MD Primary Cardiologist: Remotely Dr. Sallyanne Foley Primary Electrophysiologist:  None   Patient Profile:   Jacqueline Foley is a 84 y.o. female with a PMH of HTN, HLD, TIA, and GERD who is being seen today for the evaluation of chest pain at the request of Dr. Bonner Foley.  History of Present Illness:   Jacqueline Foley has been having intermittent chest pain for the past 2 weeks. She reports having multiple episodes per day which she describes as a pressure sensation on the left side of her chest which radiates to her left arm and under her left breast. Her symptoms are exacerbated by activity. She reports intermittent associated SOB and dizziness. She denies diaphoresis, N/V, or syncope. She reports having an old prescription for SL nitro and has taken 5-6 tabs in the past 2 weeks which improved her symptoms. She reports presenting to her PCP at [facility] with these complaints and was referred to the ED for further evaluation  She was last evaluated by cardiology 11/2013 for follow-up of recent hospitalization for SOB and chest pain. She underwent a NST at that which had no evidence of ischemia and EF 65%. She denies prior history of CAD but reports her mother and father both had heart disease.   She reports her last episode of chest pressure occurred yesterday. She denies orthopnea, PND, or LE edema. She is hopeful to complete her ischemic work-up inpatient.   Hospital course: intermittently hypertensive; otherwise VSS. Labs notable for electrolytes wnl, Cr 0.97, Hgb 10.7 (around baseline), PLT 219, Ddimer 2.29, Trop neg x3. EKG with NSR with non-specific ST abnormalities in inferior leads; otherwise no STE/D or TWI. CXR without acute findings. CT PE without PE; aortic atherosclerosis noted. Echo 11/08/17 with EF 60-65%, unable  to assess LV diastolic function. Home medications continued and patient was admitted to medicine. Cardiology asked to evaluate patient for chest pain.   Past Medical History:  Diagnosis Date  . Chest pain at rest, negative MI, negative stress test, may have been GI 12/06/2013  . Diabetes mellitus without complication ([place])   . DOE (dyspnea on exertion), may be due to HTN 12/06/2013  . GERD (gastroesophageal reflux disease)   . H/O diabetes mellitus   . HTN (hypertension) 12/06/2013  . Hypercholesteremia   . Hypertension   . TIA (transient ischemic attack)     Past Surgical History:  Procedure Laterality Date  . CATARACT EXTRACTION, BILATERAL  2018  . EYE SURGERY    . FOOT SURGERY       Home Medications:  Prior to Admission medications   Medication Sig Start Date End Date Taking? Authorizing Provider  albuterol (PROVENTIL HFA;VENTOLIN HFA) 108 (90 Base) MCG/ACT inhaler Inhale 2 puffs into the lungs every 6 (six) hours as needed for wheezing or shortness of breath.   Yes [provider]  amLODipine (NORVASC) 5 MG tablet Take 5 mg by mouth daily.   Yes [provider]  aspirin EC 81 MG tablet Take 325 mg by mouth daily.    Yes [provider]  atorvastatin (LIPITOR) 20 MG tablet Take 20 mg by mouth daily.    Yes [provider]  Cholecalciferol (VITAMIN D3 PO) Take 1 tablet by mouth daily. 1000 units = 1 tablet   Yes [provider]  ferrous gluconate (FERGON) 324 MG tablet Take 324 mg by  mouth daily with breakfast.   Yes [provider]  ferrous sulfate 325 (65 FE) MG tablet Take 325 mg by mouth daily with breakfast.   Yes [provider]  fluticasone (FLONASE) 50 MCG/ACT nasal spray Place 1 spray into both nostrils daily.   Yes [provider]  gabapentin (NEURONTIN) 300 MG capsule Take 600 mg by mouth at bedtime.   Yes [provider]  MEGARED OMEGA-3 KRILL OIL 500 MG CAPS Take 1 capsule by mouth daily.   Yes  [provider]  pantoprazole (PROTONIX) 40 MG tablet Take 40 mg by mouth daily.  02/07/17  Yes [provider]  Biotin (BIOTIN MAXIMUM STRENGTH) 5 MG CAPS Take 1 capsule by mouth daily.    [provider]  doxepin (SINEQUAN) 25 MG capsule Take 50 mg by mouth at bedtime. Take with food 03/09/17   [provider]  triamterene-hydrochlorothiazide (DYAZIDE) 37.5-25 MG capsule Take 1 capsule by mouth daily.    [provider]    Inpatient Medications: Scheduled Meds: . amLODipine  5 mg Oral Daily  . aspirin EC  325 mg Oral Daily  . atorvastatin  20 mg Oral Daily  . enoxaparin (LOVENOX) injection  40 mg Subcutaneous Q24H  . ferrous gluconate  324 mg Oral Q breakfast  . fluticasone  1 spray Each Nare Daily  . gabapentin  600 mg Oral QHS  . pantoprazole  40 mg Oral Daily   Continuous Infusions:  PRN Meds: acetaminophen, albuterol, iopamidol, ondansetron (ZOFRAN) IV  Allergies:    Allergies  Allergen Reactions  . Tomato Swelling and Rash    Social History:   Social History   Socioeconomic History  . Marital status: Divorced    Spouse name: Not on file  . Number of children: 6  . Years of education: 24  . Highest education level: Not on file  Occupational History  . Not on file  Social Needs  . Financial resource strain: Not on file  . Food insecurity:    Worry: Not on file    Inability: Not on file  . Transportation needs:    Medical: Not on file    Non-medical: Not on file  Tobacco Use  . Smoking status: Never Smoker  . Smokeless tobacco: Never Used  Substance and Sexual Activity  . Alcohol use: Yes    Comment: wine during some holidays  . Drug use: No  . Sexual activity: Not on file  Lifestyle  . Physical activity:    Days per week: Not on file    Minutes per session: Not on file  . Stress: Not on file  Relationships  . Social connections:    Talks on phone: Not on file    Gets together: Not on file    Attends  religious service: Not on file    Active member of club or organization: Not on file    Attends meetings of clubs or organizations: Not on file    Relationship status: Not on file  . Intimate partner violence:    Fear of current or ex partner: Not on file    Emotionally abused: Not on file    Physically abused: Not on file    Forced sexual activity: Not on file  Other Topics Concern  . Not on file  Social History Narrative   Lives at home alone   Right handed   1 cup of caffeine daily    Family History:    Family History  Problem  Relation Age of Onset  . Diabetes Mother   . Heart disease Mother   . Hypertension Mother   . Prostate cancer Father      ROS:  Please see the history of present illness.   All other ROS reviewed and negative.     Physical Exam/Data:   Vitals:   11/08/17 1700 11/08/17 1825 11/08/17 2100 11/09/17 0500  BP: (!) 142/75 (!) 144/74 114/67 120/72  Pulse: 74  84   Resp: 15 15 18 18   Temp:  98 F (36.7 C) 98.3 F (36.8 C) 98 F (36.7 C)  TempSrc:  Oral Oral Oral  SpO2: 96%  98% 96%  Weight:  135 lb 6.4 oz (61.4 kg)    Height:  5\' 4"  (1.626 m)      Intake/Output Summary (Last 24 hours) at 11/09/2017 0731 Last data filed at 11/09/2017 0600 Gross per 24 hour  Intake 120 ml  Output 0 ml  Net 120 ml   Filed Weights   11/07/17 1853 11/08/17 1825  Weight: 140 lb (63.5 kg) 135 lb 6.4 oz (61.4 kg)   Body mass index is 23.24 kg/m.  General:  Elderly AAF, laying in bed in NAD HEENT: sclera anicteric  Neck: no JVD Vascular: No carotid bruits; distal pulses 2+ bilaterally Cardiac:  normal S1, S2; RRR; no murmurs, gallops, or rubs Lungs:  clear to auscultation bilaterally, no wheezing, rhonchi or rales  Abd: NABS, soft, nontender, no hepatomegaly Ext: no edema; muscle wasting of LLE (history of polio) Musculoskeletal:  No deformities, BUE and BLE strength normal and equal Skin: warm and dry  Neuro:  CNs 2-12 intact, no focal abnormalities  noted Psych:  Tangential  EKG:  The EKG was personally reviewed and demonstrates:  NSR with  non-specific ST abnormalities in inferior leads; otherwise no STE/D or TWI.  Telemetry:  Telemetry was personally reviewed and demonstrates:  NSR with occasional PVCs  Relevant CV Studies:  Echocardiogram 11/08/17: Study Conclusions  - Left ventricle: The cavity size was normal. Wall thickness was   normal. Systolic function was normal. The estimated ejection   fraction was in the range of 60% to 65%. The study is not   technically sufficient to allow evaluation of LV diastolic   function.  Laboratory Data:  Chemistry Recent Labs  Lab 11/07/17 1859 11/08/17 0757 11/09/17 0428  NA 143  --  143  K 3.1*  --  3.9  CL 107  --  107  CO2 30  --  29  GLUCOSE 93  --  97  BUN 15  --  14  CREATININE 0.98 0.99 0.97  CALCIUM 9.1  --  9.0  GFRNONAA 52* 52* 53*  GFRAA >60 60* >60  ANIONGAP 6  --  7    No results for input(s): PROT, ALBUMIN, AST, ALT, ALKPHOS, BILITOT in the last 168 hours. Hematology Recent Labs  Lab 11/07/17 1859 11/08/17 0757 11/09/17 0428  WBC 4.7 5.2 5.0  RBC 4.36 4.31 4.08  HGB 11.3* 11.0* 10.7*  HCT 37.1 36.1 34.7*  MCV 85.1 83.8 85.0  MCH 25.9* 25.5* 26.2  MCHC 30.5 30.5 30.8  RDW 18.6* 18.5* 18.7*  PLT 239 235 219   Cardiac Enzymes Recent Labs  Lab 11/08/17 0757 11/08/17 1302 11/08/17 1901  TROPONINI <0.03 <0.03 <0.03    Recent Labs  Lab 11/07/17 1918 11/08/17 0306  TROPIPOC 0.00 0.00    BNPNo results for input(s): BNP, PROBNP in the last 168 hours.  DDimer  Recent Labs  Lab 11/08/17 0757  DDIMER 2.29*    Radiology/Studies:  Dg Chest 2 View  Result Date: 11/07/2017 CLINICAL DATA:  Left-sided chest pressure ongoing for 2 weeks EXAM: CHEST - 2 VIEW COMPARISON:  12/06/2013 FINDINGS: The heart size and mediastinal contours are within normal limits. Slight uncoiling of the atherosclerotic aorta. No aneurysm is noted. Minimal subpleural  atelectasis and/or scarring at the left lung base. No pulmonary consolidation or CHF. No pneumothorax nor effusion. Mild thoracolumbar spondylosis. IMPRESSION: No active cardiopulmonary disease. Electronically Signed   By: Ashley Royalty M.D.   On: 11/07/2017 19:52   Ct Angio Chest Pe W Or Wo Contrast  Result Date: 11/08/2017 CLINICAL DATA:  Left-sided chest pain for 2 weeks EXAM: CT ANGIOGRAPHY CHEST WITH CONTRAST TECHNIQUE: Multidetector CT imaging of the chest was performed using the standard protocol during bolus administration of intravenous contrast. Multiplanar CT image reconstructions and MIPs were obtained to evaluate the vascular anatomy. CONTRAST:  33mL ISOVUE-370 IOPAMIDOL (ISOVUE-370) INJECTION 76% COMPARISON:  Plain film from previous day FINDINGS: Cardiovascular: Thoracic aorta demonstrates mild atherosclerotic calcification. No aneurysmal dilatation is noted. No significant opacification to evaluate for dissection is seen. No cardiac enlargement is noted. Mild coronary calcifications are noted. The pulmonary artery shows a normal branching pattern without evidence of filling defect to suggest pulmonary embolism. Mediastinum/Nodes: Thoracic inlet is within normal limits. No hilar or mediastinal adenopathy is seen. The esophagus as visualized is within normal limits. Lungs/Pleura: The lungs are well aerated without evidence of focal infiltrate or sizable effusion. Mild dependent atelectatic changes are noted. No pulmonary nodules are seen. Upper Abdomen: Visualized upper abdomen is unremarkable. Musculoskeletal: Degenerative changes of the thoracic spine are noted. Review of the MIP images confirms the above findings. IMPRESSION: No evidence of pulmonary emboli. Minimal dependent atelectatic changes. Aortic Atherosclerosis (ICD10-I70.0). Electronically Signed   By: Inez Catalina M.D.   On: 11/08/2017 11:05    Assessment and Plan:   1. Chest pain: patient reports intermittent chest pain x2 weeks.  Symptoms are aggravated by activity and improve with SL nitro. EKG with non-specific ST abnormalities in inferior leads; otherwise no STE/D or TWI. Trop negative x3. Ddimer elevated. CT chest without PE. Last ischemic evaluation was a NST in 2015 which was without evidence of ischemia. Echo this admission with EF 60-65% and no mention of wall motion abnormalities. Risk factors for ACS include HTN, HLD, prior TIA. - Will obtain a NST to complete ischemic work-up. Anticipate she will be stable for discharge from a cardiology perspective if negative.  2. HTN: BP intermittently elevated - Could consider increasing home amlodipine to 10mg  daily for better BP control  3. HLD: no recent lipids - Continue statin  4. GERD: possibly contributing to #1 - Continue PPI  5. History of TIA:  - Continue ASA and statin   For questions or updates, please contact [place] Please consult [email or web address] for contact info under Cardiology/STEMI.   Signed, Abigail Butts, PA-C  11/09/2017 7:31 AM [phone]

## 2017-11-09 NOTE — Care Management Obs Status (Signed)
[facility] NOTIFICATION   Patient Details  Name: Jacqueline Foley MRN: [id number] Date of Birth: 1933/03/18   Medicare Observation Status Notification Given:  Yes    Erenest Rasher, RN 11/09/2017, 3:06 PM

## 2017-11-09 NOTE — Discharge Summary (Signed)
Physician Discharge Summary  Tatayana Beshears JOA:[id number] DOB: 13-Jun-1932 DOA: 11/07/2017  PCP: Vernie Shanks, MD  Admit date: 11/07/2017 Discharge date: 11/09/2017  Admitted From: Home Disposition: Home   Recommendations for Outpatient Follow-up:  1. Follow up with PCP in 1-2 weeks  Home Health: None Equipment/Devices: None Discharge Condition: Stable CODE STATUS: Full Diet recommendation: Heart healthy  Brief/Interim Summary: Jacqueline Foley is an 84 y.o. female with a history of HTN, HLD, TIA who presented to the ED with chest pain at rest and exertion, improved with expired NTG at home, located on left, at times radiating down the arm. She was admitted for ACS evaluation. Troponins were negative, echo normal. ECG showed NSR with new IRBBB and lateral TWI's. Nuclear stress testing was performed by cardiology consultant and found to be low risk. Pain has subsided and she is stable for discharge with PCP follow up for further investigation.  Discharge Diagnoses:  Principal Problem:   Chest pain Active Problems:   HTN (hypertension)  Chest pain: Typical and atypical features. D-dimer elevated but subsequent CT chest without PE or other explanation for pain.  - Low risk nuclear stress test performed 6/12 and symptoms have resolved. Suspect GI vs. MSK etiology, will require PCP follow up.   HTN: Continue norvasc, dyazide  HLD: Continue statin  GERD: Continue PPI  Hx TIA: Continue ASA, statin.   Discharge Instructions Discharge Instructions    Diet - low sodium heart healthy   Complete by:  As directed    Discharge instructions   Complete by:  As directed    You were evaluated for chest pain and had a work up which included a stress test which was found to be low risk. No life-threatening causes of this pain were found and you are stable for discharge. You will need to follow up with your PCP for further management and/or seek medical attention sooner if your symptoms  worsen/return.   Increase activity slowly   Complete by:  As directed      Allergies as of 11/09/2017      Reactions   Tomato Swelling, Rash      Medication List    TAKE these medications   albuterol 108 (90 Base) MCG/ACT inhaler Commonly known as:  PROVENTIL HFA;VENTOLIN HFA Inhale 2 puffs into the lungs every 6 (six) hours as needed for wheezing or shortness of breath.   amLODipine 5 MG tablet Commonly known as:  NORVASC Take 5 mg by mouth daily.   aspirin EC 81 MG tablet Take 325 mg by mouth daily.   atorvastatin 20 MG tablet Commonly known as:  LIPITOR Take 20 mg by mouth daily.   BIOTIN MAXIMUM STRENGTH 5 MG Caps Generic drug:  Biotin Take 1 capsule by mouth daily.   doxepin 25 MG capsule Commonly known as:  SINEQUAN Take 50 mg by mouth at bedtime. Take with food   ferrous gluconate 324 MG tablet Commonly known as:  FERGON Take 324 mg by mouth daily with breakfast.   ferrous sulfate 325 (65 FE) MG tablet Take 325 mg by mouth daily with breakfast.   fluticasone 50 MCG/ACT nasal spray Commonly known as:  FLONASE Place 1 spray into both nostrils daily.   gabapentin 300 MG capsule Commonly known as:  NEURONTIN Take 600 mg by mouth at bedtime.   MEGARED OMEGA-3 KRILL OIL 500 MG Caps Take 1 capsule by mouth daily.   pantoprazole 40 MG tablet Commonly known as:  PROTONIX Take 40 mg by mouth daily.  triamterene-hydrochlorothiazide 37.5-25 MG capsule Commonly known as:  DYAZIDE Take 1 capsule by mouth daily.   VITAMIN D3 PO Take 1 tablet by mouth daily. 1000 units = 1 tablet      Follow-up Information    Croitoru, Mihai, MD Follow up.   Specialty:  Cardiology Why:  if further problems please call the office for appointment or you primary care.  Contact information: [address] [place] [address] [phone]        Vernie Shanks, MD. Schedule an appointment as soon as possible for a visit in 1 week(s).   Specialty:   Family Medicine Contact information: [place] [address] [phone]          Allergies  Allergen Reactions  . Tomato Swelling and Rash    Consultations:  Cardiology  Procedures/Studies: Dg Chest 2 View  Result Date: 11/07/2017 CLINICAL DATA:  Left-sided chest pressure ongoing for 2 weeks EXAM: CHEST - 2 VIEW COMPARISON:  12/06/2013 FINDINGS: The heart size and mediastinal contours are within normal limits. Slight uncoiling of the atherosclerotic aorta. No aneurysm is noted. Minimal subpleural atelectasis and/or scarring at the left lung base. No pulmonary consolidation or CHF. No pneumothorax nor effusion. Mild thoracolumbar spondylosis. IMPRESSION: No active cardiopulmonary disease. Electronically Signed   By: Ashley Royalty M.D.   On: 11/07/2017 19:52   Ct Angio Chest Pe W Or Wo Contrast  Result Date: 11/08/2017 CLINICAL DATA:  Left-sided chest pain for 2 weeks EXAM: CT ANGIOGRAPHY CHEST WITH CONTRAST TECHNIQUE: Multidetector CT imaging of the chest was performed using the standard protocol during bolus administration of intravenous contrast. Multiplanar CT image reconstructions and MIPs were obtained to evaluate the vascular anatomy. CONTRAST:  52mL ISOVUE-370 IOPAMIDOL (ISOVUE-370) INJECTION 76% COMPARISON:  Plain film from previous day FINDINGS: Cardiovascular: Thoracic aorta demonstrates mild atherosclerotic calcification. No aneurysmal dilatation is noted. No significant opacification to evaluate for dissection is seen. No cardiac enlargement is noted. Mild coronary calcifications are noted. The pulmonary artery shows a normal branching pattern without evidence of filling defect to suggest pulmonary embolism. Mediastinum/Nodes: Thoracic inlet is within normal limits. No hilar or mediastinal adenopathy is seen. The esophagus as visualized is within normal limits. Lungs/Pleura: The lungs are well aerated without evidence of focal infiltrate or sizable effusion. Mild  dependent atelectatic changes are noted. No pulmonary nodules are seen. Upper Abdomen: Visualized upper abdomen is unremarkable. Musculoskeletal: Degenerative changes of the thoracic spine are noted. Review of the MIP images confirms the above findings. IMPRESSION: No evidence of pulmonary emboli. Minimal dependent atelectatic changes. Aortic Atherosclerosis (ICD10-I70.0). Electronically Signed   By: Inez Catalina M.D.   On: 11/08/2017 11:05   Nm Myocar Multi W/spect W/wall Motion / Ef  Result Date: 11/09/2017 CLINICAL DATA:  Acute coronary syndrome. EXAM: MYOCARDIAL IMAGING WITH SPECT (REST AND PHARMACOLOGIC-STRESS) GATED LEFT VENTRICULAR WALL MOTION STUDY LEFT VENTRICULAR EJECTION FRACTION TECHNIQUE: Standard myocardial SPECT imaging was performed after resting intravenous injection of 10 mCi Tc-13m tetrofosmin. Subsequently, intravenous infusion of Lexiscan was performed under the supervision of the Cardiology staff. At peak effect of the drug, 30 mCi Tc-81m tetrofosmin was injected intravenously and standard myocardial SPECT imaging was performed. Quantitative gated imaging was also performed to evaluate left ventricular wall motion, and estimate left ventricular ejection fraction. COMPARISON:  None. FINDINGS: Perfusion: Decreased activity noted inferiorly on both rest and stress images most compatible with diaphragmatic attenuation. Wall Motion: Normal left ventricular wall motion. No left ventricular dilation. Left Ventricular Ejection Fraction:  57 % End diastolic volume 68 ml End systolic volume 29 ml IMPRESSION: 1. No reversible ischemia or infarction. 2. Normal left ventricular wall motion. 3. Left ventricular ejection fraction 57% 4. Non invasive risk stratification*: Low *2012 Appropriate Use Criteria for Coronary Revascularization Focused Update: J Am Coll Cardiol. 9983;38(2):505-397. http://content.airportbarriers.com.aspx?articleid=[email or web address] Electronically Signed   By: Rolm Baptise M.D.   On:  11/09/2017 13:21    Subjective: No further chest pain and no dyspnea. Has eaten and ambulated without issues.   Discharge Exam: Vitals:   11/09/17 1043 11/09/17 1411  BP: 108/78 (!) 136/96  Pulse:  (!) 105  Resp:  (!) 25  Temp:  98.6 F (37 C)  SpO2:     General: Pt is alert, awake, not in acute distress Cardiovascular: RRR, S1/S2 +, no rubs, no gallops Respiratory: CTA bilaterally, no wheezing, no rhonchi Abdominal: Soft, NT, ND, bowel sounds + Extremities: No edema, no cyanosis  Labs: BNP (last 3 results) No results for input(s): BNP in the last 8760 hours. Basic Metabolic Panel: Recent Labs  Lab 11/07/17 1859 11/08/17 0757 11/09/17 0428  NA 143  --  143  K 3.1*  --  3.9  CL 107  --  107  CO2 30  --  29  GLUCOSE 93  --  97  BUN 15  --  14  CREATININE 0.98 0.99 0.97  CALCIUM 9.1  --  9.0   Liver Function Tests: No results for input(s): AST, ALT, ALKPHOS, BILITOT, PROT, ALBUMIN in the last 168 hours. No results for input(s): LIPASE, AMYLASE in the last 168 hours. No results for input(s): AMMONIA in the last 168 hours. CBC: Recent Labs  Lab 11/07/17 1859 11/08/17 0757 11/09/17 0428  WBC 4.7 5.2 5.0  HGB 11.3* 11.0* 10.7*  HCT 37.1 36.1 34.7*  MCV 85.1 83.8 85.0  PLT 239 235 219   Cardiac Enzymes: Recent Labs  Lab 11/08/17 0757 11/08/17 1302 11/08/17 1901  TROPONINI <0.03 <0.03 <0.03   BNP: Invalid input(s): POCBNP CBG: No results for input(s): GLUCAP in the last 168 hours. D-Dimer Recent Labs    11/08/17 0757  DDIMER 2.29*   Hgb A1c No results for input(s): HGBA1C in the last 72 hours. Lipid Profile No results for input(s): CHOL, HDL, LDLCALC, TRIG, CHOLHDL, LDLDIRECT in the last 72 hours. Thyroid function studies No results for input(s): TSH, T4TOTAL, T3FREE, THYROIDAB in the last 72 hours.  Invalid input(s): FREET3 Anemia work up No results for input(s): VITAMINB12, FOLATE, FERRITIN, TIBC, IRON, RETICCTPCT in the last 72  hours. Urinalysis No results found for: COLORURINE, APPEARANCEUR, LABSPEC, [place], GLUCOSEU, HGBUR, BILIRUBINUR, KETONESUR, PROTEINUR, UROBILINOGEN, NITRITE, [place]  Microbiology No results found for this or any previous visit (from the past 240 hour(s)).  Time coordinating discharge: Approximately 40 minutes  Patrecia Pour, MD  [facility] 11/09/2017, 2:21 PM Pager [phone]

## 2017-11-09 NOTE — Progress Notes (Signed)
Discharge instructions, RX's and follow up appts explained and provided to patient and c/g verbalized understanding.  Winferd Wease, Tivis Ringer, RN

## 2017-11-09 NOTE — Progress Notes (Signed)
Stress test is neg for ischemia.   I informed the pt.

## 2017-11-09 NOTE — Progress Notes (Signed)
Stress portion of nuc study completed without complications.  Complete study to follow.

## 2017-11-15 DIAGNOSIS — K219 Gastro-esophageal reflux disease without esophagitis: Secondary | ICD-10-CM | POA: Diagnosis not present

## 2017-11-15 DIAGNOSIS — E785 Hyperlipidemia, unspecified: Secondary | ICD-10-CM | POA: Diagnosis not present

## 2017-11-15 DIAGNOSIS — M159 Polyosteoarthritis, unspecified: Secondary | ICD-10-CM | POA: Diagnosis not present

## 2017-11-15 DIAGNOSIS — G14 Postpolio syndrome: Secondary | ICD-10-CM | POA: Diagnosis not present

## 2017-11-15 DIAGNOSIS — D649 Anemia, unspecified: Secondary | ICD-10-CM | POA: Diagnosis not present

## 2017-11-15 DIAGNOSIS — R29898 Other symptoms and signs involving the musculoskeletal system: Secondary | ICD-10-CM | POA: Diagnosis not present

## 2017-11-15 DIAGNOSIS — M7989 Other specified soft tissue disorders: Secondary | ICD-10-CM | POA: Diagnosis not present

## 2017-11-15 DIAGNOSIS — I10 Essential (primary) hypertension: Secondary | ICD-10-CM | POA: Diagnosis not present

## 2017-11-15 DIAGNOSIS — G4700 Insomnia, unspecified: Secondary | ICD-10-CM | POA: Diagnosis not present

## 2017-11-15 DIAGNOSIS — Z09 Encounter for follow-up examination after completed treatment for conditions other than malignant neoplasm: Secondary | ICD-10-CM | POA: Diagnosis not present

## 2017-11-15 DIAGNOSIS — M810 Age-related osteoporosis without current pathological fracture: Secondary | ICD-10-CM | POA: Diagnosis not present

## 2017-11-15 DIAGNOSIS — R269 Unspecified abnormalities of gait and mobility: Secondary | ICD-10-CM | POA: Diagnosis not present

## 2017-12-29 ENCOUNTER — Other Ambulatory Visit: Payer: Self-pay | Admitting: Neurology

## 2018-01-06 ENCOUNTER — Ambulatory Visit: Payer: Medicare Other | Admitting: Cardiology

## 2018-01-16 DIAGNOSIS — I10 Essential (primary) hypertension: Secondary | ICD-10-CM | POA: Diagnosis not present

## 2018-01-16 DIAGNOSIS — R29898 Other symptoms and signs involving the musculoskeletal system: Secondary | ICD-10-CM | POA: Diagnosis not present

## 2018-01-16 DIAGNOSIS — E785 Hyperlipidemia, unspecified: Secondary | ICD-10-CM | POA: Diagnosis not present

## 2018-01-16 DIAGNOSIS — M810 Age-related osteoporosis without current pathological fracture: Secondary | ICD-10-CM | POA: Diagnosis not present

## 2018-01-16 DIAGNOSIS — G4700 Insomnia, unspecified: Secondary | ICD-10-CM | POA: Diagnosis not present

## 2018-01-16 DIAGNOSIS — K219 Gastro-esophageal reflux disease without esophagitis: Secondary | ICD-10-CM | POA: Diagnosis not present

## 2018-01-16 DIAGNOSIS — M7989 Other specified soft tissue disorders: Secondary | ICD-10-CM | POA: Diagnosis not present

## 2018-01-16 DIAGNOSIS — R269 Unspecified abnormalities of gait and mobility: Secondary | ICD-10-CM | POA: Diagnosis not present

## 2018-01-16 DIAGNOSIS — G14 Postpolio syndrome: Secondary | ICD-10-CM | POA: Diagnosis not present

## 2018-01-16 DIAGNOSIS — D649 Anemia, unspecified: Secondary | ICD-10-CM | POA: Diagnosis not present

## 2018-01-16 DIAGNOSIS — M159 Polyosteoarthritis, unspecified: Secondary | ICD-10-CM | POA: Diagnosis not present

## 2018-01-16 DIAGNOSIS — M62838 Other muscle spasm: Secondary | ICD-10-CM | POA: Diagnosis not present

## 2018-02-17 ENCOUNTER — Ambulatory Visit (INDEPENDENT_AMBULATORY_CARE_PROVIDER_SITE_OTHER): Payer: Medicare Other | Admitting: Cardiology

## 2018-02-17 ENCOUNTER — Encounter: Payer: Self-pay | Admitting: Cardiology

## 2018-02-17 VITALS — BP 144/90 | HR 86 | Ht 64.0 in | Wt 137.0 lb

## 2018-02-17 DIAGNOSIS — I10 Essential (primary) hypertension: Secondary | ICD-10-CM

## 2018-02-17 DIAGNOSIS — Z7189 Other specified counseling: Secondary | ICD-10-CM

## 2018-02-17 DIAGNOSIS — R072 Precordial pain: Secondary | ICD-10-CM | POA: Diagnosis not present

## 2018-02-17 DIAGNOSIS — Z79899 Other long term (current) drug therapy: Secondary | ICD-10-CM

## 2018-02-17 MED ORDER — NITROGLYCERIN 0.4 MG SL SUBL: 0.4000 mg | SUBLINGUAL_TABLET | SUBLINGUAL | 3 refills | Status: DC | PRN

## 2018-02-17 NOTE — Patient Instructions (Signed)
Medication Instructions: Your physician recommends that you continue on your current medications as directed.    If you need a refill on your cardiac medications before your next appointment, please call your pharmacy.   Labwork: None  Procedures/Testing: None  Follow-Up: Your physician wants you to follow-up in 1 year with Dr. Harrell Gave. You will receive a reminder letter in the mail two months in advance. If you don't receive a letter, please call our office at [phone] to schedule this follow-up appointment.   Special Instructions:    Thank you for choosing [facility] at [facility]!!

## 2018-02-17 NOTE — Progress Notes (Signed)
Cardiology Office Note:    Date:  02/17/2018   ID:  Jacqueline Foley, DOB June 24, 1932, MRN [id number]  PCP:  Vernie Shanks, MD  Cardiologist:  Buford Dresser, MD PhD  Referring MD: Vernie Shanks, MD   CC: chest pain  History of Present Illness:    Jacqueline Foley is a 84 y.o. female with a hx of chest pain, diabetes type II, hypertension, GERD, TIA, HLD who is seen as a new consult at the request of Vernie Shanks, MD for the evaluation and management of chest pain and recent hospital evaluation.  Cardiac history: Was seen previously at [facility] (not in Care Everywhere)--worked at [employer] for 26 years, then moved to [place] for 5-6 years (Betsey Holiday, [employer]). She saw a cardiologist in [place] in the [facility] main hospital at that time. She was put on lasix but unsure what other testing was done at the time. No cath. Thinks possibly an echo. Never told there were any issues with her heart. Was given a NG prescription in [place] that she hasn't needed until this recent event.  She states that in early June, she developed intermittent chest discomfort. It occurred both at rest and with exertion. Pressure like. It was left sided, radiates to under the left breast and down the left arm, associated with mild shortness of breath but no nausea/diaphoresis. She took some of her old NG with some improvement. Nothing else seemed to make it better or worse. She presented to the ER and was admitted for observation. Troponins were negative x3, echo was normal, nuclear lexiscan was low risk. CTPE negative (d-dimer was elevated).  FH: her father and mother both had heart disease  Past Medical History:  Diagnosis Date  . Chest pain at rest, negative MI, negative stress test, may have been GI 12/06/2013  . Diabetes mellitus without complication ([place])   . DOE (dyspnea on exertion), may be due to HTN 12/06/2013  . GERD (gastroesophageal reflux disease)   . H/O diabetes  mellitus   . HTN (hypertension) 12/06/2013  . Hypercholesteremia   . Hypertension   . TIA (transient ischemic attack)     Past Surgical History:  Procedure Laterality Date  . CATARACT EXTRACTION, BILATERAL  2018  . EYE SURGERY    . FOOT SURGERY      Current Medications: Current Outpatient Medications on File Prior to Visit  Medication Sig  . albuterol (PROVENTIL HFA;VENTOLIN HFA) 108 (90 Base) MCG/ACT inhaler Inhale 2 puffs into the lungs every 6 (six) hours as needed for wheezing or shortness of breath.  Marland Kitchen amLODipine (NORVASC) 5 MG tablet Take 5 mg by mouth daily.  Marland Kitchen atorvastatin (LIPITOR) 20 MG tablet Take 20 mg by mouth daily.   . Biotin (BIOTIN MAXIMUM STRENGTH) 5 MG CAPS Take 1 capsule by mouth daily.  . Cholecalciferol (VITAMIN D3 PO) Take 1 tablet by mouth daily. 1000 units = 1 tablet  . doxepin (SINEQUAN) 25 MG capsule Take 50 mg by mouth at bedtime. Take with food  . ferrous sulfate 325 (65 FE) MG tablet Take 325 mg by mouth daily with breakfast.  . fluticasone (FLONASE) 50 MCG/ACT nasal spray Place 1 spray into both nostrils daily.  Marland Kitchen MEGARED OMEGA-3 KRILL OIL 500 MG CAPS Take 1 capsule by mouth daily.  . pantoprazole (PROTONIX) 40 MG tablet Take 40 mg by mouth daily.   Marland Kitchen triamterene-hydrochlorothiazide (DYAZIDE) 37.5-25 MG capsule Take 1 capsule by mouth daily.  . meloxicam (MOBIC)  15 MG tablet Take 1 tablet by mouth daily as needed.   No current facility-administered medications on file prior to visit.      Allergies:   Tomato   Social History   Socioeconomic History  . Marital status: Divorced    Spouse name: Not on file  . Number of children: 6  . Years of education: 49  . Highest education level: Not on file  Occupational History  . Not on file  Social Needs  . Financial resource strain: Not on file  . Food insecurity:    Worry: Not on file    Inability: Not on file  . Transportation needs:    Medical: Not on file    Non-medical: Not on file  Tobacco  Use  . Smoking status: Never Smoker  . Smokeless tobacco: Never Used  Substance and Sexual Activity  . Alcohol use: Yes    Comment: wine during some holidays  . Drug use: No  . Sexual activity: Not on file  Lifestyle  . Physical activity:    Days per week: Not on file    Minutes per session: Not on file  . Stress: Not on file  Relationships  . Social connections:    Talks on phone: Not on file    Gets together: Not on file    Attends religious service: Not on file    Active member of club or organization: Not on file    Attends meetings of clubs or organizations: Not on file    Relationship status: Not on file  Other Topics Concern  . Not on file  Social History Narrative   Lives at home alone   Right handed   1 cup of caffeine daily     Family History: The patient's family history includes Diabetes in her mother; Heart disease in her mother; Hypertension in her mother; Prostate cancer in her father.  ROS:   Please see the history of present illness.  Additional pertinent ROS: Review of Systems  Constitutional: Negative for chills, fever and malaise/fatigue.  HENT: Negative for ear pain and hearing loss.   Eyes: Negative for blurred vision and pain.  Respiratory: Negative for shortness of breath and wheezing.   Cardiovascular: Positive for chest pain and leg swelling. Negative for palpitations, orthopnea, claudication and PND.       Intermittent LE edema, none recently  Gastrointestinal: Negative for abdominal pain, blood in stool and melena.  Genitourinary: Negative for dysuria and hematuria.  Musculoskeletal: Positive for joint pain. Negative for falls and myalgias.  Skin: Negative for rash.  Neurological: Negative for sensory change, focal weakness and loss of consciousness.  Endo/Heme/Allergies: Does not bruise/bleed easily.   EKGs/Labs/Other Studies Reviewed:    The following studies were reviewed today: NM lexiscan 11/09/17 FINDINGS: Perfusion: Decreased  activity noted inferiorly on both rest and stress images most compatible with diaphragmatic attenuation.  Wall Motion: Normal left ventricular wall motion. No left ventricular dilation.  Left Ventricular Ejection Fraction: 57 % End diastolic volume 68 ml End systolic volume 29 ml  IMPRESSION: 1. No reversible ischemia or infarction. 2. Normal left ventricular wall motion. 3. Left ventricular ejection fraction 57% 4. Non invasive risk stratification*: Low  Echo 11/08/17 Study Conclusions - Left ventricle: The cavity size was normal. Wall thickness was   normal. Systolic function was normal. The estimated ejection   fraction was in the range of 60% to 65%. The study is not   technically sufficient to allow evaluation of LV diastolic  function. No significant valve disease on my review  CTPE 11/08/17 Cardiovascular: Thoracic aorta demonstrates mild atherosclerotic calcification. No aneurysmal dilatation is noted. No significant opacification to evaluate for dissection is seen. No cardiac enlargement is noted. Mild coronary calcifications are noted. The pulmonary artery shows a normal branching pattern without evidence of filling defect to suggest pulmonary embolism. IMPRESSION: No evidence of pulmonary emboli.  Minimal dependent atelectatic changes.  Aortic Atherosclerosis (ICD10-I70.0).  EKG:  EKG is ordered today.  The ekg ordered today demonstrates normal sinus rhythm, nonspecific T wave changes  Recent Labs: 11/09/2017: BUN 14; Creatinine, Ser 0.97; Hemoglobin 10.7; Platelets 219; Potassium 3.9; Sodium 143  Recent Lipid Panel    Component Value Date/Time   CHOL 154 12/06/2013 0740   TRIG 148 12/06/2013 0740   HDL 45 12/06/2013 0740   CHOLHDL 3.4 12/06/2013 0740   VLDL 30 12/06/2013 0740   LDLCALC 79 12/06/2013 0740    Physical Exam:    VS:  BP (!) 144/90   Pulse 86   Ht 5\' 4"  (1.626 m)   Wt 137 lb (62.1 kg)   BMI 23.52 kg/m     Wt Readings from  Last 3 Encounters:  02/17/18 137 lb (62.1 kg)  11/08/17 135 lb 6.4 oz (61.4 kg)  08/04/17 140 lb 6.4 oz (63.7 kg)     GEN: Well nourished, well developed in no acute distress HEENT: Normal NECK: No JVD; No carotid bruits LYMPHATICS: No lymphadenopathy CARDIAC: regular rhythm, normal S1 and S2, no murmurs, rubs, gallops. Radial and DP pulses 2+ bilaterally. RESPIRATORY:  Clear to auscultation without rales, wheezing or rhonchi  ABDOMEN: Soft, non-tender, non-distended MUSCULOSKELETAL:  Bilateral trace brawny edema SKIN: Warm and dry NEUROLOGIC:  Alert and oriented x 3 PSYCHIATRIC:  Normal affect   ASSESSMENT:    1. Precordial pain   2. Essential hypertension   3. Medication management   4. Counseling on health promotion and disease prevention    PLAN:    1. Chest pain: normal echo, low risk lexiscan, negative troponins. Suggests that the etiology is not related to CAD. Does have a history of GERD, has some spine degeneration as well. Question neuropathic vs. GI vs. Other. PE excluded. No significant lung disease on recent CT scan. -refilled her NG prescription as hers was years old. Instructed on how to use, when to seen emergency medical attention. 2. Hypertension: slightly above goal today, but has fluctuated. Continue current medications. 3. Prevention: has a history of TIA, so technically she is secondary prevention. Was on aspirin 325 mg distantly, 81 mg aspirin more recently. Stopped when she started meloxicam. Does not need preventative aspirin for the heart. Her TIAs are very remote. Concern is that both meloxicam and aspirin increase risk of GI upset and bleeding. She continues on a statin. She has been watching her salt, trying heart healthy diet, active when she is able.  Plan for follow up: 1 year or sooner PRN  Medication Adjustments/Labs and Tests Ordered: Current medicines are reviewed at length with the patient today.  Concerns regarding medicines are outlined above.   Orders Placed This Encounter  Procedures  . EKG 12-Lead   Meds ordered this encounter  Medications  . nitroGLYCERIN (NITROSTAT) 0.4 MG SL tablet    Sig: Place 1 tablet (0.4 mg total) under the tongue every 5 (five) minutes as needed for chest pain.    Dispense:  90 tablet    Refill:  3    Patient Instructions  Medication Instructions: Your physician  recommends that you continue on your current medications as directed.    If you need a refill on your cardiac medications before your next appointment, please call your pharmacy.   Labwork: None  Procedures/Testing: None  Follow-Up: Your physician wants you to follow-up in 1 year with Dr. Harrell Gave. You will receive a reminder letter in the mail two months in advance. If you don't receive a letter, please call our office at [phone] to schedule this follow-up appointment.   Special Instructions:    Thank you for choosing [facility]care at [facility]!!       Signed, Buford Dresser, MD PhD 02/17/2018 10:43 AM    [place]

## 2018-03-28 DIAGNOSIS — G14 Postpolio syndrome: Secondary | ICD-10-CM | POA: Diagnosis not present

## 2018-03-28 DIAGNOSIS — G4700 Insomnia, unspecified: Secondary | ICD-10-CM | POA: Diagnosis not present

## 2018-03-28 DIAGNOSIS — R269 Unspecified abnormalities of gait and mobility: Secondary | ICD-10-CM | POA: Diagnosis not present

## 2018-03-28 DIAGNOSIS — M7989 Other specified soft tissue disorders: Secondary | ICD-10-CM | POA: Diagnosis not present

## 2018-03-28 DIAGNOSIS — R29898 Other symptoms and signs involving the musculoskeletal system: Secondary | ICD-10-CM | POA: Diagnosis not present

## 2018-03-28 DIAGNOSIS — E785 Hyperlipidemia, unspecified: Secondary | ICD-10-CM | POA: Diagnosis not present

## 2018-03-28 DIAGNOSIS — I10 Essential (primary) hypertension: Secondary | ICD-10-CM | POA: Diagnosis not present

## 2018-03-28 DIAGNOSIS — M159 Polyosteoarthritis, unspecified: Secondary | ICD-10-CM | POA: Diagnosis not present

## 2018-03-28 DIAGNOSIS — M62838 Other muscle spasm: Secondary | ICD-10-CM | POA: Diagnosis not present

## 2018-03-28 DIAGNOSIS — M810 Age-related osteoporosis without current pathological fracture: Secondary | ICD-10-CM | POA: Diagnosis not present

## 2018-03-28 DIAGNOSIS — K219 Gastro-esophageal reflux disease without esophagitis: Secondary | ICD-10-CM | POA: Diagnosis not present

## 2018-03-28 DIAGNOSIS — D649 Anemia, unspecified: Secondary | ICD-10-CM | POA: Diagnosis not present

## 2018-04-02 DIAGNOSIS — Z23 Encounter for immunization: Secondary | ICD-10-CM | POA: Diagnosis not present

## 2018-06-01 DIAGNOSIS — R269 Unspecified abnormalities of gait and mobility: Secondary | ICD-10-CM | POA: Diagnosis not present

## 2018-06-01 DIAGNOSIS — M810 Age-related osteoporosis without current pathological fracture: Secondary | ICD-10-CM | POA: Diagnosis not present

## 2018-06-01 DIAGNOSIS — K219 Gastro-esophageal reflux disease without esophagitis: Secondary | ICD-10-CM | POA: Diagnosis not present

## 2018-06-01 DIAGNOSIS — M7989 Other specified soft tissue disorders: Secondary | ICD-10-CM | POA: Diagnosis not present

## 2018-06-01 DIAGNOSIS — M159 Polyosteoarthritis, unspecified: Secondary | ICD-10-CM | POA: Diagnosis not present

## 2018-06-01 DIAGNOSIS — E785 Hyperlipidemia, unspecified: Secondary | ICD-10-CM | POA: Diagnosis not present

## 2018-06-01 DIAGNOSIS — G4700 Insomnia, unspecified: Secondary | ICD-10-CM | POA: Diagnosis not present

## 2018-06-01 DIAGNOSIS — I10 Essential (primary) hypertension: Secondary | ICD-10-CM | POA: Diagnosis not present

## 2018-06-01 DIAGNOSIS — G14 Postpolio syndrome: Secondary | ICD-10-CM | POA: Diagnosis not present

## 2018-06-01 DIAGNOSIS — D649 Anemia, unspecified: Secondary | ICD-10-CM | POA: Diagnosis not present

## 2018-06-01 DIAGNOSIS — F439 Reaction to severe stress, unspecified: Secondary | ICD-10-CM | POA: Diagnosis not present

## 2018-06-01 DIAGNOSIS — R29898 Other symptoms and signs involving the musculoskeletal system: Secondary | ICD-10-CM | POA: Diagnosis not present

## 2018-10-20 DIAGNOSIS — M25512 Pain in left shoulder: Secondary | ICD-10-CM | POA: Diagnosis not present

## 2018-10-20 DIAGNOSIS — M5416 Radiculopathy, lumbar region: Secondary | ICD-10-CM | POA: Diagnosis not present

## 2018-10-26 DIAGNOSIS — M67912 Unspecified disorder of synovium and tendon, left shoulder: Secondary | ICD-10-CM | POA: Diagnosis not present

## 2018-10-26 DIAGNOSIS — M19012 Primary osteoarthritis, left shoulder: Secondary | ICD-10-CM | POA: Diagnosis not present

## 2018-10-26 DIAGNOSIS — M25512 Pain in left shoulder: Secondary | ICD-10-CM | POA: Diagnosis not present

## 2018-10-31 DIAGNOSIS — M25612 Stiffness of left shoulder, not elsewhere classified: Secondary | ICD-10-CM | POA: Diagnosis not present

## 2018-10-31 DIAGNOSIS — M19012 Primary osteoarthritis, left shoulder: Secondary | ICD-10-CM | POA: Diagnosis not present

## 2018-11-03 DIAGNOSIS — M25612 Stiffness of left shoulder, not elsewhere classified: Secondary | ICD-10-CM | POA: Diagnosis not present

## 2018-11-03 DIAGNOSIS — M19012 Primary osteoarthritis, left shoulder: Secondary | ICD-10-CM | POA: Diagnosis not present

## 2018-11-06 DIAGNOSIS — M19012 Primary osteoarthritis, left shoulder: Secondary | ICD-10-CM | POA: Diagnosis not present

## 2018-11-06 DIAGNOSIS — M25612 Stiffness of left shoulder, not elsewhere classified: Secondary | ICD-10-CM | POA: Diagnosis not present

## 2018-11-08 DIAGNOSIS — M25612 Stiffness of left shoulder, not elsewhere classified: Secondary | ICD-10-CM | POA: Diagnosis not present

## 2018-11-08 DIAGNOSIS — M19012 Primary osteoarthritis, left shoulder: Secondary | ICD-10-CM | POA: Diagnosis not present

## 2018-11-16 DIAGNOSIS — M19012 Primary osteoarthritis, left shoulder: Secondary | ICD-10-CM | POA: Diagnosis not present

## 2018-11-16 DIAGNOSIS — M25612 Stiffness of left shoulder, not elsewhere classified: Secondary | ICD-10-CM | POA: Diagnosis not present

## 2018-11-21 DIAGNOSIS — M25612 Stiffness of left shoulder, not elsewhere classified: Secondary | ICD-10-CM | POA: Diagnosis not present

## 2018-11-21 DIAGNOSIS — M19012 Primary osteoarthritis, left shoulder: Secondary | ICD-10-CM | POA: Diagnosis not present

## 2018-12-04 DIAGNOSIS — M67912 Unspecified disorder of synovium and tendon, left shoulder: Secondary | ICD-10-CM | POA: Diagnosis not present

## 2018-12-04 DIAGNOSIS — G5762 Lesion of plantar nerve, left lower limb: Secondary | ICD-10-CM | POA: Diagnosis not present

## 2018-12-04 DIAGNOSIS — M79672 Pain in left foot: Secondary | ICD-10-CM | POA: Diagnosis not present

## 2018-12-04 DIAGNOSIS — M19012 Primary osteoarthritis, left shoulder: Secondary | ICD-10-CM | POA: Diagnosis not present

## 2018-12-04 DIAGNOSIS — M25612 Stiffness of left shoulder, not elsewhere classified: Secondary | ICD-10-CM | POA: Diagnosis not present

## 2018-12-07 DIAGNOSIS — M19012 Primary osteoarthritis, left shoulder: Secondary | ICD-10-CM | POA: Diagnosis not present

## 2018-12-07 DIAGNOSIS — M25612 Stiffness of left shoulder, not elsewhere classified: Secondary | ICD-10-CM | POA: Diagnosis not present

## 2018-12-22 DIAGNOSIS — M159 Polyosteoarthritis, unspecified: Secondary | ICD-10-CM | POA: Diagnosis not present

## 2018-12-22 DIAGNOSIS — E785 Hyperlipidemia, unspecified: Secondary | ICD-10-CM | POA: Diagnosis not present

## 2018-12-22 DIAGNOSIS — G14 Postpolio syndrome: Secondary | ICD-10-CM | POA: Diagnosis not present

## 2018-12-22 DIAGNOSIS — F439 Reaction to severe stress, unspecified: Secondary | ICD-10-CM | POA: Diagnosis not present

## 2018-12-22 DIAGNOSIS — M810 Age-related osteoporosis without current pathological fracture: Secondary | ICD-10-CM | POA: Diagnosis not present

## 2018-12-22 DIAGNOSIS — R269 Unspecified abnormalities of gait and mobility: Secondary | ICD-10-CM | POA: Diagnosis not present

## 2018-12-22 DIAGNOSIS — G4700 Insomnia, unspecified: Secondary | ICD-10-CM | POA: Diagnosis not present

## 2018-12-22 DIAGNOSIS — R29898 Other symptoms and signs involving the musculoskeletal system: Secondary | ICD-10-CM | POA: Diagnosis not present

## 2018-12-22 DIAGNOSIS — I10 Essential (primary) hypertension: Secondary | ICD-10-CM | POA: Diagnosis not present

## 2018-12-22 DIAGNOSIS — D649 Anemia, unspecified: Secondary | ICD-10-CM | POA: Diagnosis not present

## 2018-12-22 DIAGNOSIS — M79605 Pain in left leg: Secondary | ICD-10-CM | POA: Diagnosis not present

## 2018-12-22 DIAGNOSIS — M79604 Pain in right leg: Secondary | ICD-10-CM | POA: Diagnosis not present

## 2019-02-15 DIAGNOSIS — Z23 Encounter for immunization: Secondary | ICD-10-CM | POA: Diagnosis not present

## 2019-03-06 DIAGNOSIS — I10 Essential (primary) hypertension: Secondary | ICD-10-CM | POA: Diagnosis not present

## 2019-03-06 DIAGNOSIS — K219 Gastro-esophageal reflux disease without esophagitis: Secondary | ICD-10-CM | POA: Diagnosis not present

## 2019-03-06 DIAGNOSIS — E785 Hyperlipidemia, unspecified: Secondary | ICD-10-CM | POA: Diagnosis not present

## 2019-03-06 DIAGNOSIS — M810 Age-related osteoporosis without current pathological fracture: Secondary | ICD-10-CM | POA: Diagnosis not present

## 2019-03-06 DIAGNOSIS — G4700 Insomnia, unspecified: Secondary | ICD-10-CM | POA: Diagnosis not present

## 2019-03-06 DIAGNOSIS — Z5181 Encounter for therapeutic drug level monitoring: Secondary | ICD-10-CM | POA: Diagnosis not present

## 2019-03-06 DIAGNOSIS — G14 Postpolio syndrome: Secondary | ICD-10-CM | POA: Diagnosis not present

## 2019-03-06 DIAGNOSIS — F439 Reaction to severe stress, unspecified: Secondary | ICD-10-CM | POA: Diagnosis not present

## 2019-03-06 DIAGNOSIS — M159 Polyosteoarthritis, unspecified: Secondary | ICD-10-CM | POA: Diagnosis not present

## 2019-03-06 DIAGNOSIS — D649 Anemia, unspecified: Secondary | ICD-10-CM | POA: Diagnosis not present

## 2019-03-06 DIAGNOSIS — R29898 Other symptoms and signs involving the musculoskeletal system: Secondary | ICD-10-CM | POA: Diagnosis not present

## 2019-03-06 DIAGNOSIS — R269 Unspecified abnormalities of gait and mobility: Secondary | ICD-10-CM | POA: Diagnosis not present

## 2019-03-06 DIAGNOSIS — J4531 Mild persistent asthma with (acute) exacerbation: Secondary | ICD-10-CM | POA: Diagnosis not present

## 2019-03-19 ENCOUNTER — Ambulatory Visit (INDEPENDENT_AMBULATORY_CARE_PROVIDER_SITE_OTHER): Payer: Medicare Other | Admitting: Cardiology

## 2019-03-19 ENCOUNTER — Encounter: Payer: Self-pay | Admitting: Cardiology

## 2019-03-19 ENCOUNTER — Other Ambulatory Visit: Payer: Self-pay

## 2019-03-19 VITALS — BP 136/86 | HR 87 | Temp 97.1°F | Ht 64.0 in | Wt 141.4 lb

## 2019-03-19 DIAGNOSIS — Z7189 Other specified counseling: Secondary | ICD-10-CM

## 2019-03-19 DIAGNOSIS — I10 Essential (primary) hypertension: Secondary | ICD-10-CM

## 2019-03-19 DIAGNOSIS — Z87898 Personal history of other specified conditions: Secondary | ICD-10-CM

## 2019-03-19 NOTE — Patient Instructions (Signed)
Medication Instructions:  Your Physician recommend you continue on your current medication as directed.    *If you need a refill on your cardiac medications before your next appointment, please call your pharmacy*  Lab Work: None  Testing/Procedures: None  Follow-Up: At [facility], you and your health needs are our priority.  As part of our continuing mission to provide you with exceptional heart care, we have created designated Provider Care Teams.  These Care Teams include your primary Cardiologist (physician) and Advanced Practice Providers (APPs -  Physician Assistants and Nurse Practitioners) who all work together to provide you with the care you need, when you need it.  Your next appointment:   12 months  The format for your next appointment:   In Person  Provider:   Bridgette Christopher, MD    

## 2019-03-19 NOTE — Progress Notes (Signed)
Cardiology Office Note:    Date:  03/19/2019   ID:  Jacqueline Foley, DOB 26-Nov-1932, MRN [id number]  PCP:  Vernie Shanks, MD  Cardiologist:  Buford Dresser, MD PhD  Referring MD: Vernie Shanks, MD   CC: follow up  History of Present Illness:    Jacqueline Foley is a 85 y.o. female with a hx of chest pain, diabetes type II, hypertension, GERD, TIA, HLD who is seen for follow up. I initially saw her as a new consult at the request of Vernie Shanks, MD for the evaluation and management of chest pain and recent hospital evaluation on 02/17/18.  Cardiac history: Was seen previously at [facility] (not in Care Everywhere)--worked at [employer] for 26 years, then moved to [place] for 5-6 years (Betsey Holiday, [facility] [employer]). She saw a cardiologist in [place] in the [facility] main hospital at that time. She was put on lasix but unsure what other testing was done at the time. No cath. Thinks possibly an echo. Never told there were any issues with her heart. In 10/2017 developed intermittent left chest discomfort, pressure, both at rest and exertional, improved with NG. She presented to the ER and was admitted for observation. Troponins were negative x3, echo was normal, nuclear lexiscan was low risk. CTPE negative (d-dimer was elevated).  Today:  Moved to independently living facility off of [address]. Doing well, enjoys her new place.  Walking daily around her complex, doing chores, etc. Asthma has flared, worst at night, cough sometimes bothers her.   No real chest pain, nothing more than occasional twinges. Has never needed nitro since last visit. Has chronic shoulder pain from arthritis but otherwise no significant pain.   Not sleeping well, was ordered a new medication for sleep. Wakes up betweem 3:30-5 AM, which was her prior work schedule, then can't go back to sleep.   Staying safe and socially distanced with Covid.  Denies shortness of breath at rest or with normal  exertion. No PND, orthopnea, LE edema or unexpected weight gain. No syncope or palpitations.  Past Medical History:  Diagnosis Date  . Chest pain at rest, negative MI, negative stress test, may have been GI 12/06/2013  . Diabetes mellitus without complication ([place])   . DOE (dyspnea on exertion), may be due to HTN 12/06/2013  . GERD (gastroesophageal reflux disease)   . H/O diabetes mellitus   . HTN (hypertension) 12/06/2013  . Hypercholesteremia   . Hypertension   . TIA (transient ischemic attack)     Past Surgical History:  Procedure Laterality Date  . CATARACT EXTRACTION, BILATERAL  2018  . EYE SURGERY    . FOOT SURGERY      Current Medications: Current Outpatient Medications on File Prior to Visit  Medication Sig  . albuterol (PROVENTIL HFA;VENTOLIN HFA) 108 (90 Base) MCG/ACT inhaler Inhale 2 puffs into the lungs every 6 (six) hours as needed for wheezing or shortness of breath.  Marland Kitchen amLODipine (NORVASC) 5 MG tablet Take 5 mg by mouth daily.  Marland Kitchen atorvastatin (LIPITOR) 20 MG tablet Take 20 mg by mouth daily.   . Biotin (BIOTIN MAXIMUM STRENGTH) 5 MG CAPS Take 1 capsule by mouth daily.  . Cholecalciferol (VITAMIN D3 PO) Take 1 tablet by mouth daily. 1000 units = 1 tablet  . doxepin (SINEQUAN) 25 MG capsule Take 50 mg by mouth at bedtime. Take with food  . ferrous sulfate 325 (65 FE) MG tablet Take 325 mg by mouth daily with breakfast.  .  fluticasone (FLONASE) 50 MCG/ACT nasal spray Place 1 spray into both nostrils daily.  Marland Kitchen MEGARED OMEGA-3 KRILL OIL 500 MG CAPS Take 1 capsule by mouth daily.  . meloxicam (MOBIC) 15 MG tablet Take 1 tablet by mouth daily as needed.  . nitroGLYCERIN (NITROSTAT) 0.4 MG SL tablet Place 1 tablet (0.4 mg total) under the tongue every 5 (five) minutes as needed for chest pain.  . pantoprazole (PROTONIX) 40 MG tablet Take 40 mg by mouth daily.   Marland Kitchen triamterene-hydrochlorothiazide (DYAZIDE) 37.5-25 MG capsule Take 1 capsule by mouth daily.   No current  facility-administered medications on file prior to visit.      Allergies:   Tomato   Social History   Socioeconomic History  . Marital status: Divorced    Spouse name: Not on file  . Number of children: 6  . Years of education: 32  . Highest education level: Not on file  Occupational History  . Not on file  Social Needs  . Financial resource strain: Not on file  . Food insecurity    Worry: Not on file    Inability: Not on file  . Transportation needs    Medical: Not on file    Non-medical: Not on file  Tobacco Use  . Smoking status: Never Smoker  . Smokeless tobacco: Never Used  Substance and Sexual Activity  . Alcohol use: Yes    Comment: wine during some holidays  . Drug use: No  . Sexual activity: Not on file  Lifestyle  . Physical activity    Days per week: Not on file    Minutes per session: Not on file  . Stress: Not on file  Relationships  . Social Herbalist on phone: Not on file    Gets together: Not on file    Attends religious service: Not on file    Active member of club or organization: Not on file    Attends meetings of clubs or organizations: Not on file    Relationship status: Not on file  Other Topics Concern  . Not on file  Social History Narrative   Lives at home alone   Right handed   1 cup of caffeine daily     Family History: The patient's family history includes Diabetes in her mother; Heart disease in her mother; Hypertension in her mother; Prostate cancer in her father.  ROS:   Please see the history of present illness.  Additional pertinent ROS: Constitutional: Negative for chills, fever, night sweats, unintentional weight loss  HENT: Negative for ear pain and hearing loss.   Eyes: Negative for loss of vision and eye pain.  Respiratory: Negative for cough, sputum, wheezing.   Cardiovascular: See HPI. Gastrointestinal: Negative for abdominal pain, melena, and hematochezia.  Genitourinary: Negative for dysuria and  hematuria.  Musculoskeletal: Negative for falls and myalgias.  Skin: Negative for itching and rash.  Neurological: Negative for focal weakness, focal sensory changes and loss of consciousness.  Endo/Heme/Allergies: Does not bruise/bleed easily.   EKGs/Labs/Other Studies Reviewed:    The following studies were reviewed today: NM lexiscan 11/09/17 FINDINGS: Perfusion: Decreased activity noted inferiorly on both rest and stress images most compatible with diaphragmatic attenuation.  Wall Motion: Normal left ventricular wall motion. No left ventricular dilation.  Left Ventricular Ejection Fraction: 57 % End diastolic volume 68 ml End systolic volume 29 ml  IMPRESSION: 1. No reversible ischemia or infarction. 2. Normal left ventricular wall motion. 3. Left ventricular ejection fraction 57%  4. Non invasive risk stratification*: Low  Echo 11/08/17 Study Conclusions - Left ventricle: The cavity size was normal. Wall thickness was   normal. Systolic function was normal. The estimated ejection   fraction was in the range of 60% to 65%. The study is not   technically sufficient to allow evaluation of LV diastolic   function. No significant valve disease on my review  CTPE 11/08/17 Cardiovascular: Thoracic aorta demonstrates mild atherosclerotic calcification. No aneurysmal dilatation is noted. No significant opacification to evaluate for dissection is seen. No cardiac enlargement is noted. Mild coronary calcifications are noted. The pulmonary artery shows a normal branching pattern without evidence of filling defect to suggest pulmonary embolism. IMPRESSION: No evidence of pulmonary emboli.  Minimal dependent atelectatic changes.  Aortic Atherosclerosis (ICD10-I70.0).  EKG:  EKG is  today.  The ekg ordered today demonstrates normal sinus rhythm  Recent Labs: No results found for requested labs within last 8760 hours.  Recent Lipid Panel    Component Value  Date/Time   CHOL 154 12/06/2013 0740   TRIG 148 12/06/2013 0740   HDL 45 12/06/2013 0740   CHOLHDL 3.4 12/06/2013 0740   VLDL 30 12/06/2013 0740   LDLCALC 79 12/06/2013 0740    Physical Exam:    VS:  BP 136/86   Pulse 87   Temp (!) 97.1 F (36.2 C)   Ht 5\' 4"  (1.626 m)   Wt 141 lb 6.4 oz (64.1 kg)   SpO2 97%   BMI 24.27 kg/m     Wt Readings from Last 3 Encounters:  03/19/19 141 lb 6.4 oz (64.1 kg)  02/17/18 137 lb (62.1 kg)  11/08/17 135 lb 6.4 oz (61.4 kg)    GEN: Well nourished, well developed in no acute distress HEENT: Normal, moist mucous membranes NECK: No JVD CARDIAC: regular rhythm, normal S1 and S2, no rubs or gallops. No murmurs. VASCULAR: Radial and DP pulses 2+ bilaterally. No carotid bruits RESPIRATORY:  Clear to auscultation without rales, wheezing or rhonchi  ABDOMEN: Soft, non-tender, non-distended MUSCULOSKELETAL:  Ambulates independently SKIN: Warm and dry, no edema NEUROLOGIC:  Alert and oriented x 3. No focal neuro deficits noted. PSYCHIATRIC:  Normal affect   ASSESSMENT:    1. History of chest pain   2. Essential hypertension   3. Cardiac risk counseling   4. Counseling on health promotion and disease prevention    PLAN:    History of Chest pain: unremarkable workup in 2019. Similar pain has not recurred, has not use SL NG -counseled on red flag warning signs that need immediate medical attention  Hypertension: within reasonable goal today given her age -continue amlodipine, triamterene-HCTZ  CV risk counseling and Secondary prevention (history of TIA) -see prior discussion re: aspirin vs. Meloxicam. We have discussed in the past, prefers to continue meloxicam for her arthritic pain -continue atorvastatin -recommend heart healthy/Mediterranean diet, with whole grains, fruits, vegetable, fish, lean meats, nuts, and olive oil. Limit salt. -recommend moderate walking, 3-5 times/week for 30-50 minutes each session. Aim for at least 150  minutes.week. Goal should be pace of 3 miles/hours, or walking 1.5 miles in 30 minutes -recommend avoidance of tobacco products. Avoid excess alcohol.  Plan for follow up: 1 year or sooner PRN  Medication Adjustments/Labs and Tests Ordered: Current medicines are reviewed at length with the patient today.  Concerns regarding medicines are outlined above.  Orders Placed This Encounter  Procedures  . EKG 12-Lead   No orders of the defined types were placed in this  encounter.   Patient Instructions  Medication Instructions:  Your Physician recommend you continue on your current medication as directed.    *If you need a refill on your cardiac medications before your next appointment, please call your pharmacy*  Lab Work: None  Testing/Procedures: None  Follow-Up: At [facility], you and your health needs are our priority.  As part of our continuing mission to provide you with exceptional heart care, we have created designated Provider Care Teams.  These Care Teams include your primary Cardiologist (physician) and Advanced Practice Providers (APPs -  Physician Assistants and Nurse Practitioners) who all work together to provide you with the care you need, when you need it.  Your next appointment:   12 months  The format for your next appointment:   In Person  Provider:   Buford Dresser, MD       Signed, Buford Dresser, MD PhD 03/19/2019 9:48 PM    [place]

## 2019-04-04 DIAGNOSIS — Z961 Presence of intraocular lens: Secondary | ICD-10-CM | POA: Diagnosis not present

## 2019-04-04 DIAGNOSIS — H1045 Other chronic allergic conjunctivitis: Secondary | ICD-10-CM | POA: Diagnosis not present

## 2019-04-04 DIAGNOSIS — H04123 Dry eye syndrome of bilateral lacrimal glands: Secondary | ICD-10-CM | POA: Diagnosis not present

## 2019-04-04 DIAGNOSIS — H401232 Low-tension glaucoma, bilateral, moderate stage: Secondary | ICD-10-CM | POA: Diagnosis not present

## 2019-07-17 DIAGNOSIS — J4531 Mild persistent asthma with (acute) exacerbation: Secondary | ICD-10-CM | POA: Diagnosis not present

## 2019-07-17 DIAGNOSIS — M810 Age-related osteoporosis without current pathological fracture: Secondary | ICD-10-CM | POA: Diagnosis not present

## 2019-07-17 DIAGNOSIS — D649 Anemia, unspecified: Secondary | ICD-10-CM | POA: Diagnosis not present

## 2019-07-17 DIAGNOSIS — Z5181 Encounter for therapeutic drug level monitoring: Secondary | ICD-10-CM | POA: Diagnosis not present

## 2019-07-17 DIAGNOSIS — G4700 Insomnia, unspecified: Secondary | ICD-10-CM | POA: Diagnosis not present

## 2019-07-17 DIAGNOSIS — M62838 Other muscle spasm: Secondary | ICD-10-CM | POA: Diagnosis not present

## 2019-07-17 DIAGNOSIS — E785 Hyperlipidemia, unspecified: Secondary | ICD-10-CM | POA: Diagnosis not present

## 2019-07-17 DIAGNOSIS — R29898 Other symptoms and signs involving the musculoskeletal system: Secondary | ICD-10-CM | POA: Diagnosis not present

## 2019-07-17 DIAGNOSIS — M159 Polyosteoarthritis, unspecified: Secondary | ICD-10-CM | POA: Diagnosis not present

## 2019-07-17 DIAGNOSIS — I10 Essential (primary) hypertension: Secondary | ICD-10-CM | POA: Diagnosis not present

## 2019-07-17 DIAGNOSIS — G14 Postpolio syndrome: Secondary | ICD-10-CM | POA: Diagnosis not present

## 2019-07-17 DIAGNOSIS — F439 Reaction to severe stress, unspecified: Secondary | ICD-10-CM | POA: Diagnosis not present

## 2019-11-13 DIAGNOSIS — R519 Headache, unspecified: Secondary | ICD-10-CM | POA: Diagnosis not present

## 2019-11-13 DIAGNOSIS — Z6822 Body mass index (BMI) 22.0-22.9, adult: Secondary | ICD-10-CM | POA: Diagnosis not present

## 2019-11-13 DIAGNOSIS — I10 Essential (primary) hypertension: Secondary | ICD-10-CM | POA: Diagnosis not present

## 2019-11-13 DIAGNOSIS — M353 Polymyalgia rheumatica: Secondary | ICD-10-CM | POA: Diagnosis not present

## 2019-11-13 DIAGNOSIS — G4700 Insomnia, unspecified: Secondary | ICD-10-CM | POA: Diagnosis not present

## 2019-11-13 DIAGNOSIS — F439 Reaction to severe stress, unspecified: Secondary | ICD-10-CM | POA: Diagnosis not present

## 2019-11-13 DIAGNOSIS — G14 Postpolio syndrome: Secondary | ICD-10-CM | POA: Diagnosis not present

## 2019-11-13 DIAGNOSIS — Z79899 Other long term (current) drug therapy: Secondary | ICD-10-CM | POA: Diagnosis not present

## 2019-11-13 DIAGNOSIS — E7849 Other hyperlipidemia: Secondary | ICD-10-CM | POA: Diagnosis not present

## 2019-12-31 DIAGNOSIS — I10 Essential (primary) hypertension: Secondary | ICD-10-CM | POA: Diagnosis not present

## 2019-12-31 DIAGNOSIS — G14 Postpolio syndrome: Secondary | ICD-10-CM | POA: Diagnosis not present

## 2019-12-31 DIAGNOSIS — Z79899 Other long term (current) drug therapy: Secondary | ICD-10-CM | POA: Diagnosis not present

## 2019-12-31 DIAGNOSIS — F439 Reaction to severe stress, unspecified: Secondary | ICD-10-CM | POA: Diagnosis not present

## 2019-12-31 DIAGNOSIS — G4700 Insomnia, unspecified: Secondary | ICD-10-CM | POA: Diagnosis not present

## 2019-12-31 DIAGNOSIS — M353 Polymyalgia rheumatica: Secondary | ICD-10-CM | POA: Diagnosis not present

## 2020-01-22 DIAGNOSIS — M1711 Unilateral primary osteoarthritis, right knee: Secondary | ICD-10-CM | POA: Diagnosis not present

## 2020-01-22 DIAGNOSIS — M25561 Pain in right knee: Secondary | ICD-10-CM | POA: Diagnosis not present

## 2020-01-28 DIAGNOSIS — M1711 Unilateral primary osteoarthritis, right knee: Secondary | ICD-10-CM | POA: Diagnosis not present

## 2020-02-01 DIAGNOSIS — M1711 Unilateral primary osteoarthritis, right knee: Secondary | ICD-10-CM | POA: Diagnosis not present

## 2020-02-06 DIAGNOSIS — M1711 Unilateral primary osteoarthritis, right knee: Secondary | ICD-10-CM | POA: Diagnosis not present

## 2020-02-11 DIAGNOSIS — M1711 Unilateral primary osteoarthritis, right knee: Secondary | ICD-10-CM | POA: Diagnosis not present

## 2020-02-14 DIAGNOSIS — M1711 Unilateral primary osteoarthritis, right knee: Secondary | ICD-10-CM | POA: Diagnosis not present

## 2020-02-19 DIAGNOSIS — M1711 Unilateral primary osteoarthritis, right knee: Secondary | ICD-10-CM | POA: Diagnosis not present

## 2020-02-21 DIAGNOSIS — M1711 Unilateral primary osteoarthritis, right knee: Secondary | ICD-10-CM | POA: Diagnosis not present

## 2020-02-26 DIAGNOSIS — M1711 Unilateral primary osteoarthritis, right knee: Secondary | ICD-10-CM | POA: Diagnosis not present

## 2020-03-03 DIAGNOSIS — Z79899 Other long term (current) drug therapy: Secondary | ICD-10-CM | POA: Diagnosis not present

## 2020-03-03 DIAGNOSIS — F439 Reaction to severe stress, unspecified: Secondary | ICD-10-CM | POA: Diagnosis not present

## 2020-03-03 DIAGNOSIS — M353 Polymyalgia rheumatica: Secondary | ICD-10-CM | POA: Diagnosis not present

## 2020-03-03 DIAGNOSIS — E785 Hyperlipidemia, unspecified: Secondary | ICD-10-CM | POA: Diagnosis not present

## 2020-03-03 DIAGNOSIS — G14 Postpolio syndrome: Secondary | ICD-10-CM | POA: Diagnosis not present

## 2020-03-03 DIAGNOSIS — D649 Anemia, unspecified: Secondary | ICD-10-CM | POA: Diagnosis not present

## 2020-03-03 DIAGNOSIS — G4700 Insomnia, unspecified: Secondary | ICD-10-CM | POA: Diagnosis not present

## 2020-03-03 DIAGNOSIS — I10 Essential (primary) hypertension: Secondary | ICD-10-CM | POA: Diagnosis not present

## 2020-03-03 DIAGNOSIS — E7849 Other hyperlipidemia: Secondary | ICD-10-CM | POA: Diagnosis not present

## 2020-03-04 DIAGNOSIS — M1711 Unilateral primary osteoarthritis, right knee: Secondary | ICD-10-CM | POA: Diagnosis not present

## 2020-03-18 ENCOUNTER — Other Ambulatory Visit: Payer: Self-pay

## 2020-03-18 ENCOUNTER — Ambulatory Visit (INDEPENDENT_AMBULATORY_CARE_PROVIDER_SITE_OTHER): Payer: Medicare Other | Admitting: Cardiology

## 2020-03-18 ENCOUNTER — Encounter: Payer: Self-pay | Admitting: Cardiology

## 2020-03-18 VITALS — BP 140/70 | HR 85 | Ht 64.0 in | Wt 134.0 lb

## 2020-03-18 DIAGNOSIS — I700 Atherosclerosis of aorta: Secondary | ICD-10-CM

## 2020-03-18 DIAGNOSIS — Z87898 Personal history of other specified conditions: Secondary | ICD-10-CM

## 2020-03-18 DIAGNOSIS — Z7189 Other specified counseling: Secondary | ICD-10-CM | POA: Diagnosis not present

## 2020-03-18 DIAGNOSIS — I10 Essential (primary) hypertension: Secondary | ICD-10-CM

## 2020-03-18 NOTE — Progress Notes (Signed)
Cardiology Office Note:    Date:  03/18/2020   ID:  Jacqueline Foley, DOB Dec 24, 1932, MRN [id number]  PCP:  Vernie Shanks, MD  Cardiologist:  Buford Dresser, MD PhD  Referring MD: Vernie Shanks, MD   CC: follow up  History of Present Illness:    Jacqueline Foley is a 86 y.o. female with a hx of chest pain, diabetes type II, hypertension, GERD, TIA, HLD who is seen for follow up. I initially saw her as a new consult at the request of Vernie Shanks, MD for the evaluation and management of chest pain and recent hospital evaluation on 02/17/18.  Cardiac history: Was seen previously at [facility] (not in Care Everywhere)--worked at [employer] for 26 years, then moved to [place] for 5-6 years ([place], [facility]). She saw a cardiologist in [place] in the [facility] main hospital at that time. She was put on lasix but unsure what other testing was done at the time. No cath. Thinks possibly an echo. Never told there were any issues with her heart. In 10/2017 developed intermittent left chest discomfort, pressure, both at rest and exertional, improved with NG. She presented to the ER and was admitted for observation. Troponins were negative x3, echo was normal, nuclear lexiscan was low risk. CTPE negative (d-dimer was elevated).  Today: Not sleeping well, working with Dr. Jacelyn Grip on this. Does not feel that any medications have helped. Feels stressed out, son moved here from [place]. Occasional, mild, very brief chest pain. Not bothersome to her. Similar to what she has had in the past, but more mild. Doing well in independently living. Staying very active. Has physical therapy for her leg pain, plans to join the [employer]   Denies shortness of breath at rest or with normal exertion. No PND, orthopnea, LE edema or unexpected weight gain. No syncope or palpitations. Tolerating all medications.   Past Medical History:  Diagnosis Date  . Chest pain at rest, negative MI, negative  stress test, may have been GI 12/06/2013  . Diabetes mellitus without complication ([place])   . DOE (dyspnea on exertion), may be due to HTN 12/06/2013  . GERD (gastroesophageal reflux disease)   . H/O diabetes mellitus   . HTN (hypertension) 12/06/2013  . Hypercholesteremia   . Hypertension   . TIA (transient ischemic attack)     Past Surgical History:  Procedure Laterality Date  . CATARACT EXTRACTION, BILATERAL  2018  . EYE SURGERY    . FOOT SURGERY      Current Medications: Current Outpatient Medications on File Prior to Visit  Medication Sig  . albuterol (PROVENTIL HFA;VENTOLIN HFA) 108 (90 Base) MCG/ACT inhaler Inhale 2 puffs into the lungs every 6 (six) hours as needed for wheezing or shortness of breath.  Marland Kitchen amLODipine (NORVASC) 5 MG tablet Take 5 mg by mouth daily.  Marland Kitchen atorvastatin (LIPITOR) 20 MG tablet Take 20 mg by mouth daily.   . Biotin (BIOTIN MAXIMUM STRENGTH) 5 MG CAPS Take 1 capsule by mouth daily.  . Cholecalciferol (VITAMIN D3 PO) Take 1 tablet by mouth daily. 1000 units = 1 tablet  . ferrous sulfate 325 (65 FE) MG tablet Take 325 mg by mouth daily with breakfast.  . fluticasone (FLONASE) 50 MCG/ACT nasal spray Place 1 spray into both nostrils daily.  Marland Kitchen MEGARED OMEGA-3 KRILL OIL 500 MG CAPS Take 1 capsule by mouth daily.  . meloxicam (MOBIC) 15 MG tablet Take 1 tablet by mouth daily as needed.  Marland Kitchen  pantoprazole (PROTONIX) 40 MG tablet Take 40 mg by mouth daily.   Marland Kitchen tiZANidine (ZANAFLEX) 2 MG tablet Take 2 mg by mouth 2 (two) times daily.  . traZODone (DESYREL) 50 MG tablet Take 50 mg by mouth at bedtime.  . triamterene-hydrochlorothiazide (MAXZIDE-25) 37.5-25 MG tablet Take 1 tablet by mouth daily.  . nitroGLYCERIN (NITROSTAT) 0.4 MG SL tablet Place 1 tablet (0.4 mg total) under the tongue every 5 (five) minutes as needed for chest pain.   No current facility-administered medications on file prior to visit.     Allergies:   Tomato   Social History   Tobacco Use  .  Smoking status: Never Smoker  . Smokeless tobacco: Never Used  Vaping Use  . Vaping Use: Never used  Substance Use Topics  . Alcohol use: Yes    Comment: wine during some holidays  . Drug use: No    Family History: The patient's family history includes Diabetes in her mother; Heart disease in her mother; Hypertension in her mother; Prostate cancer in her father.  ROS:   Please see the history of present illness.  Additional pertinent ROS otherwise unremarkable.  EKGs/Labs/Other Studies Reviewed:    The following studies were reviewed today: NM lexiscan 11/09/17 FINDINGS: Perfusion: Decreased activity noted inferiorly on both rest and stress images most compatible with diaphragmatic attenuation.  Wall Motion: Normal left ventricular wall motion. No left ventricular dilation.  Left Ventricular Ejection Fraction: 57 % End diastolic volume 68 ml End systolic volume 29 ml  IMPRESSION: 1. No reversible ischemia or infarction. 2. Normal left ventricular wall motion. 3. Left ventricular ejection fraction 57% 4. Non invasive risk stratification*: Low  Echo 11/08/17 Study Conclusions - Left ventricle: The cavity size was normal. Wall thickness was   normal. Systolic function was normal. The estimated ejection   fraction was in the range of 60% to 65%. The study is not   technically sufficient to allow evaluation of LV diastolic   function. No significant valve disease on my review  CTPE 11/08/17 Cardiovascular: Thoracic aorta demonstrates mild atherosclerotic calcification. No aneurysmal dilatation is noted. No significant opacification to evaluate for dissection is seen. No cardiac enlargement is noted. Mild coronary calcifications are noted. The pulmonary artery shows a normal branching pattern without evidence of filling defect to suggest pulmonary embolism. IMPRESSION: No evidence of pulmonary emboli.  Minimal dependent atelectatic changes.  Aortic  Atherosclerosis (ICD10-I70.0).  EKG:  EKG is personally reviewed today.  The ekg ordered today demonstrates normal sinus rhythm with PVC  Recent Labs: No results found for requested labs within last 8760 hours.  Recent Lipid Panel    Component Value Date/Time   CHOL 154 12/06/2013 0740   TRIG 148 12/06/2013 0740   HDL 45 12/06/2013 0740   CHOLHDL 3.4 12/06/2013 0740   VLDL 30 12/06/2013 0740   LDLCALC 79 12/06/2013 0740    Physical Exam:    VS:  BP 140/70   Pulse 85   Ht 5\' 4"  (1.626 m)   Wt 134 lb (60.8 kg)   SpO2 99%   BMI 23.00 kg/m     Wt Readings from Last 3 Encounters:  03/18/20 134 lb (60.8 kg)  03/19/19 141 lb 6.4 oz (64.1 kg)  02/17/18 137 lb (62.1 kg)    GEN: Well nourished, well developed in no acute distress HEENT: Normal, moist mucous membranes NECK: No JVD CARDIAC: regular rhythm, normal S1 and S2, no rubs or gallops. No murmurs. VASCULAR: Radial and DP pulses 2+ bilaterally.  No carotid bruits RESPIRATORY:  Clear to auscultation without rales, wheezing or rhonchi  ABDOMEN: Soft, non-tender, non-distended MUSCULOSKELETAL:  Ambulates independently SKIN: Warm and dry, no edema NEUROLOGIC:  Alert and oriented x 3. No focal neuro deficits noted. PSYCHIATRIC:  Normal affect   ASSESSMENT:    1. History of chest pain   2. Essential hypertension   3. Aortic atherosclerosis (HCC)   4. Cardiac risk counseling   5. Counseling on health promotion and disease prevention    PLAN:    History of Chest pain: unremarkable workup in 2019. Has rare similar pain, occasional, mild. She is not concerned by it -counseled on red flag warning signs that need immediate medical attention  Hypertension: within reasonable goal today given her age -continue amlodipine, triamterene-HCTZ  Aortic atherosclerosis:  -continue atorvastation  CV risk counseling and Secondary prevention (history of TIA) -see prior discussion re: aspirin vs. Meloxicam. We have discussed in the  past, prefers to continue meloxicam for her arthritic pain -recommend heart healthy/Mediterranean diet, with whole grains, fruits, vegetable, fish, lean meats, nuts, and olive oil. Limit salt. -recommend moderate walking, 3-5 times/week for 30-50 minutes each session. Aim for at least 150 minutes.week. Goal should be pace of 3 miles/hours, or walking 1.5 miles in 30 minutes -recommend avoidance of tobacco products. Avoid excess alcohol.  Plan for follow up: 1 year or sooner PRN  Medication Adjustments/Labs and Tests Ordered: Current medicines are reviewed at length with the patient today.  Concerns regarding medicines are outlined above.  No orders of the defined types were placed in this encounter.  No orders of the defined types were placed in this encounter.   Patient Instructions  Medication Instructions:  No changes *If you need a refill on your cardiac medications before your next appointment, please call your pharmacy*   Lab Work: None If you have labs (blood work) drawn today and your tests are completely normal, you will receive your results only by: Marland Kitchen MyChart Message (if you have MyChart) OR . A paper copy in the mail If you have any lab test that is abnormal or we need to change your treatment, we will call you to review the results.   Testing/Procedures: None   Follow-Up: At [facility], you and your health needs are our priority.  As part of our continuing mission to provide you with exceptional heart care, we have created designated Provider Care Teams.  These Care Teams include your primary Cardiologist (physician) and Advanced Practice Providers (APPs -  Physician Assistants and Nurse Practitioners) who all work together to provide you with the care you need, when you need it.  We recommend signing up for the patient portal called "MyChart".  Sign up information is provided on this After Visit Summary.  MyChart is used to connect with patients for Virtual Visits  (Telemedicine).  Patients are able to view lab/test results, encounter notes, upcoming appointments, etc.  Non-urgent messages can be sent to your provider as well.   To learn more about what you can do with MyChart, go to [email or web address].    Your next appointment:   1 year  The format for your next appointment:   In Person  Provider:   Buford Dresser, MD     Signed, Buford Dresser, MD PhD 03/18/2020 5:48 PM    [place]

## 2020-03-18 NOTE — Patient Instructions (Signed)
Medication Instructions:  No changes *If you need a refill on your cardiac medications before your next appointment, please call your pharmacy*   Lab Work: None If you have labs (blood work) drawn today and your tests are completely normal, you will receive your results only by: Marland Kitchen MyChart Message (if you have MyChart) OR . A paper copy in the mail If you have any lab test that is abnormal or we need to change your treatment, we will call you to review the results.   Testing/Procedures: None   Follow-Up: At [facility], you and your health needs are our priority.  As part of our continuing mission to provide you with exceptional heart care, we have created designated Provider Care Teams.  These Care Teams include your primary Cardiologist (physician) and Advanced Practice Providers (APPs -  Physician Assistants and Nurse Practitioners) who all work together to provide you with the care you need, when you need it.  We recommend signing up for the patient portal called "MyChart".  Sign up information is provided on this After Visit Summary.  MyChart is used to connect with patients for Virtual Visits (Telemedicine).  Patients are able to view lab/test results, encounter notes, upcoming appointments, etc.  Non-urgent messages can be sent to your provider as well.   To learn more about what you can do with MyChart, go to [email or web address].    Your next appointment:   1 year  The format for your next appointment:   In Person  Provider:   Buford Dresser, MD

## 2020-03-21 NOTE — Addendum Note (Signed)
Addended by: Merri Ray A on: 03/21/2020 03:21 PM   Modules accepted: Orders

## 2020-04-05 DIAGNOSIS — Z23 Encounter for immunization: Secondary | ICD-10-CM | POA: Diagnosis not present

## 2020-04-07 DIAGNOSIS — H1045 Other chronic allergic conjunctivitis: Secondary | ICD-10-CM | POA: Diagnosis not present

## 2020-04-07 DIAGNOSIS — H401232 Low-tension glaucoma, bilateral, moderate stage: Secondary | ICD-10-CM | POA: Diagnosis not present

## 2020-04-07 DIAGNOSIS — Z961 Presence of intraocular lens: Secondary | ICD-10-CM | POA: Diagnosis not present

## 2020-04-07 DIAGNOSIS — H04123 Dry eye syndrome of bilateral lacrimal glands: Secondary | ICD-10-CM | POA: Diagnosis not present

## 2020-05-09 DIAGNOSIS — G14 Postpolio syndrome: Secondary | ICD-10-CM | POA: Diagnosis not present

## 2020-05-09 DIAGNOSIS — I10 Essential (primary) hypertension: Secondary | ICD-10-CM | POA: Diagnosis not present

## 2020-05-09 DIAGNOSIS — M353 Polymyalgia rheumatica: Secondary | ICD-10-CM | POA: Diagnosis not present

## 2020-05-09 DIAGNOSIS — E7849 Other hyperlipidemia: Secondary | ICD-10-CM | POA: Diagnosis not present

## 2020-05-09 DIAGNOSIS — G4700 Insomnia, unspecified: Secondary | ICD-10-CM | POA: Diagnosis not present

## 2020-05-09 DIAGNOSIS — D649 Anemia, unspecified: Secondary | ICD-10-CM | POA: Diagnosis not present

## 2020-05-09 DIAGNOSIS — F439 Reaction to severe stress, unspecified: Secondary | ICD-10-CM | POA: Diagnosis not present

## 2020-05-09 DIAGNOSIS — Z79899 Other long term (current) drug therapy: Secondary | ICD-10-CM | POA: Diagnosis not present

## 2020-05-09 DIAGNOSIS — E785 Hyperlipidemia, unspecified: Secondary | ICD-10-CM | POA: Diagnosis not present

## 2020-07-08 DIAGNOSIS — R053 Chronic cough: Secondary | ICD-10-CM | POA: Diagnosis not present

## 2020-07-08 DIAGNOSIS — G4700 Insomnia, unspecified: Secondary | ICD-10-CM | POA: Diagnosis not present

## 2020-07-08 DIAGNOSIS — E785 Hyperlipidemia, unspecified: Secondary | ICD-10-CM | POA: Diagnosis not present

## 2020-07-08 DIAGNOSIS — G14 Postpolio syndrome: Secondary | ICD-10-CM | POA: Diagnosis not present

## 2020-07-08 DIAGNOSIS — F439 Reaction to severe stress, unspecified: Secondary | ICD-10-CM | POA: Diagnosis not present

## 2020-07-08 DIAGNOSIS — D649 Anemia, unspecified: Secondary | ICD-10-CM | POA: Diagnosis not present

## 2020-07-08 DIAGNOSIS — M353 Polymyalgia rheumatica: Secondary | ICD-10-CM | POA: Diagnosis not present

## 2020-07-08 DIAGNOSIS — Z79899 Other long term (current) drug therapy: Secondary | ICD-10-CM | POA: Diagnosis not present

## 2020-07-08 DIAGNOSIS — R079 Chest pain, unspecified: Secondary | ICD-10-CM | POA: Diagnosis not present

## 2020-07-08 DIAGNOSIS — I10 Essential (primary) hypertension: Secondary | ICD-10-CM | POA: Diagnosis not present

## 2020-08-13 ENCOUNTER — Other Ambulatory Visit: Payer: Self-pay

## 2020-08-13 ENCOUNTER — Encounter: Payer: Self-pay | Admitting: Cardiology

## 2020-08-13 ENCOUNTER — Ambulatory Visit (INDEPENDENT_AMBULATORY_CARE_PROVIDER_SITE_OTHER): Payer: Medicare Other | Admitting: Cardiology

## 2020-08-13 VITALS — BP 137/87 | HR 85 | Ht 64.0 in | Wt 130.4 lb

## 2020-08-13 DIAGNOSIS — Z7189 Other specified counseling: Secondary | ICD-10-CM

## 2020-08-13 DIAGNOSIS — I10 Essential (primary) hypertension: Secondary | ICD-10-CM

## 2020-08-13 DIAGNOSIS — R072 Precordial pain: Secondary | ICD-10-CM

## 2020-08-13 DIAGNOSIS — I700 Atherosclerosis of aorta: Secondary | ICD-10-CM

## 2020-08-13 NOTE — Patient Instructions (Addendum)
Medication Instructions:  I suspect that the pantoprazole replaces the omeprazole (prilosec), but clarify with Dr. Jacelyn Grip.  What the medications are for: Albuterol--inhaler for wheezing Amlodipine--blood pressure medicine Atorvastatin--cholesterol pill Biotin (vitamin) Cholecalciferol (vitamin) Ferrous sulfate (iron pill) Krill oil (fish oil) Meloxicam--antiinflammatory, can upset the stomach Nitroglycerin--for chest pain Pantoprazole--acid reducer/stomach protector Tizanidine--muscle relaxer Trazodone--for sleep Triamterene-HCTZ--blood pressure medicine  *If you need a refill on your cardiac medications before your next appointment, please call your pharmacy*   Lab Work: None   Testing/Procedures: Your physician has requested that you have a lexiscan myoview. For further information please visit [email or web address]. Please follow instruction sheet, as given. [place]   Follow-Up: At [facility], you and your health needs are our priority.  As part of our continuing mission to provide you with exceptional heart care, we have created designated Provider Care Teams.  These Care Teams include your primary Cardiologist (physician) and Advanced Practice Providers (APPs -  Physician Assistants and Nurse Practitioners) who all work together to provide you with the care you need, when you need it.  We recommend signing up for the patient portal called "MyChart".  Sign up information is provided on this After Visit Summary.  MyChart is used to connect with patients for Virtual Visits (Telemedicine).  Patients are able to view lab/test results, encounter notes, upcoming appointments, etc.  Non-urgent messages can be sent to your provider as well.   To learn more about what you can do with MyChart, go to [email or web address].    Your next appointment:   8 month(s) at [place] location  The format for your next appointment:   In Person  Provider:   Buford Dresser, MD  You are scheduled for a Myocardial Perfusion Imaging Study on Please arrive 15 minutes prior to your appointment time for registration and insurance purposes.  The test will take approximately 3 to 4 hours to complete; you may bring reading material.  If someone comes with you to your appointment, they will need to remain in the main lobby due to limited space in the testing area. **If you are pregnant or breastfeeding, please notify the nuclear lab prior to your appointment**  How to prepare for your Myocardial Perfusion Test: . Do not eat or drink 3 hours prior to your test, except you may have water. . Do not consume products containing caffeine (regular or decaffeinated) 12 hours prior to your test. (ex: coffee, chocolate, sodas, tea). . Do bring a list of your current medications with you.  If not listed below, you may take your medications as normal. . Do wear comfortable clothes (no dresses or overalls) and walking shoes, tennis shoes preferred (No heels or open toe shoes are allowed). . Do NOT wear cologne, perfume, aftershave, or lotions (deodorant is allowed). . If these instructions are not followed, your test will have to be rescheduled.  Please report to [place], [address] for your test.  If you have questions or concerns about your appointment, you can call the Nuclear Lab at [phone].  If you cannot keep your appointment, please provide 24 hours notification to the Nuclear Lab, to avoid a possible $50 charge to your account.

## 2020-08-13 NOTE — Progress Notes (Signed)
Cardiology Office Note:    Date:  08/13/2020   ID:  Jacqueline Foley, DOB 05/25/1933, MRN [id number]  PCP:  Jacqueline Shanks, MD  Cardiologist:  Buford Dresser, MD PhD  Referring MD: Jacqueline Shanks, MD   CC: follow up  History of Present Illness:    Jacqueline Foley is a 87 y.o. female with a hx of chest pain, diabetes type II, hypertension, GERD, TIA, HLD who is seen for follow up. I initially saw her as a new consult at the request of Jacqueline Shanks, MD for the evaluation and management of chest pain and recent hospital evaluation on 02/17/18.  Cardiac history: Was seen previously at [facility] (not in Care Everywhere)--worked at [employer] for 26 years, then moved to [place] for 5-6 years ([place] [employer], [employer]). She saw a cardiologist in [place] in the [facility] main hospital at that time. She was put on lasix but unsure what other testing was done at the time. No cath. Thinks possibly an echo. Never told there were any issues with her heart. In 10/2017 developed intermittent left chest discomfort, pressure, both at rest and exertional, improved with NG. She presented to the ER and was admitted for observation. Troponins were negative x3, echo was normal, nuclear lexiscan was low risk. CTPE negative (d-dimer was elevated).  Today: Here with her daughter today. Brings bag of medications, reviewed today. Asking what each is for, description for her on her AVS.  Ate dinner late last night, around 7 PM. Went to bed around 8:30-9 PM, had chest pain and didn't sleep well. Felt like a pressing on her chest, felt hot. Blood pressure was fine at the time.   Chest discomfort occurs every day, better if she eats dinner early. Chest pressing is not always in the evenings, can sometime be exertional/make her feel fatigued. Taking nitroglycerin frequently.  No syncope, but feels occasional lightheadedness.   We reviewed possible causes of chest pain, both cardiac and  non-cardiac, see below.  Past Medical History:  Diagnosis Date  . Chest pain at rest, negative MI, negative stress test, may have been GI 12/06/2013  . Diabetes mellitus without complication ([place])   . DOE (dyspnea on exertion), may be due to HTN 12/06/2013  . GERD (gastroesophageal reflux disease)   . H/O diabetes mellitus   . HTN (hypertension) 12/06/2013  . Hypercholesteremia   . Hypertension   . TIA (transient ischemic attack)     Past Surgical History:  Procedure Laterality Date  . CATARACT EXTRACTION, BILATERAL  2018  . EYE SURGERY    . FOOT SURGERY      Current Medications: Current Outpatient Medications on File Prior to Visit  Medication Sig  . albuterol (PROVENTIL HFA;VENTOLIN HFA) 108 (90 Base) MCG/ACT inhaler Inhale 2 puffs into the lungs every 6 (six) hours as needed for wheezing or shortness of breath.  Marland Kitchen amLODipine (NORVASC) 5 MG tablet Take 5 mg by mouth daily.  Marland Kitchen atorvastatin (LIPITOR) 20 MG tablet Take 20 mg by mouth daily.   . Biotin 5 MG CAPS Take 1 capsule by mouth daily.  . Cholecalciferol (VITAMIN D3 PO) Take 1 tablet by mouth daily. 1000 units = 1 tablet  . ferrous sulfate 325 (65 FE) MG tablet Take 325 mg by mouth daily with breakfast.  . fluticasone (FLONASE) 50 MCG/ACT nasal spray Place 1 spray into both nostrils daily.  Marland Kitchen MEGARED OMEGA-3 KRILL OIL 500 MG CAPS Take 1 capsule by mouth daily.  Marland Kitchen  meloxicam (MOBIC) 15 MG tablet Take 1 tablet by mouth daily as needed.  . pantoprazole (PROTONIX) 40 MG tablet Take 40 mg by mouth daily.   Marland Kitchen tiZANidine (ZANAFLEX) 2 MG tablet Take 2 mg by mouth 2 (two) times daily.  . traZODone (DESYREL) 50 MG tablet Take 50 mg by mouth at bedtime.  . triamterene-hydrochlorothiazide (MAXZIDE-25) 37.5-25 MG tablet Take 1 tablet by mouth daily.  . nitroGLYCERIN (NITROSTAT) 0.4 MG SL tablet Place 1 tablet (0.4 mg total) under the tongue every 5 (five) minutes as needed for chest pain.   No current facility-administered medications on  file prior to visit.     Allergies:   Tomato   Social History   Tobacco Use  . Smoking status: Never Smoker  . Smokeless tobacco: Never Used  Vaping Use  . Vaping Use: Never used  Substance Use Topics  . Alcohol use: Yes    Comment: wine during some holidays  . Drug use: No    Family History: The patient's family history includes Diabetes in her mother; Heart disease in her mother; Hypertension in her mother; Prostate cancer in her father.  ROS:   Please see the history of present illness.  Additional pertinent ROS otherwise unremarkable.  EKGs/Labs/Other Studies Reviewed:    The following studies were reviewed today: NM lexiscan 11/09/17 FINDINGS: Perfusion: Decreased activity noted inferiorly on both rest and stress images most compatible with diaphragmatic attenuation.  Wall Motion: Normal left ventricular wall motion. No left ventricular dilation.  Left Ventricular Ejection Fraction: 57 % End diastolic volume 68 ml End systolic volume 29 ml  IMPRESSION: 1. No reversible ischemia or infarction. 2. Normal left ventricular wall motion. 3. Left ventricular ejection fraction 57% 4. Non invasive risk stratification*: Low  Echo 11/08/17 Study Conclusions - Left ventricle: The cavity size was normal. Wall thickness was   normal. Systolic function was normal. The estimated ejection   fraction was in the range of 60% to 65%. The study is not   technically sufficient to allow evaluation of LV diastolic   function. No significant valve disease on my review  CTPE 11/08/17 Cardiovascular: Thoracic aorta demonstrates mild atherosclerotic calcification. No aneurysmal dilatation is noted. No significant opacification to evaluate for dissection is seen. No cardiac enlargement is noted. Mild coronary calcifications are noted. The pulmonary artery shows a normal branching pattern without evidence of filling defect to suggest pulmonary embolism. IMPRESSION: No  evidence of pulmonary emboli.  Minimal dependent atelectatic changes.  Aortic Atherosclerosis (ICD10-I70.0).  EKG:  EKG is personally reviewed today.  The ekg ordered today demonstrates normal sinus rhythm with 2 PVCs at 85 bpm  Recent Labs: No results found for requested labs within last 8760 hours.  Recent Lipid Panel    Component Value Date/Time   CHOL 154 12/06/2013 0740   TRIG 148 12/06/2013 0740   HDL 45 12/06/2013 0740   CHOLHDL 3.4 12/06/2013 0740   VLDL 30 12/06/2013 0740   LDLCALC 79 12/06/2013 0740    Physical Exam:    VS:  BP 137/87   Pulse 85   Ht 5\' 4"  (1.626 m)   Wt 130 lb 6.4 oz (59.1 kg)   SpO2 100%   BMI 22.38 kg/m     Wt Readings from Last 3 Encounters:  08/13/20 130 lb 6.4 oz (59.1 kg)  03/18/20 134 lb (60.8 kg)  03/19/19 141 lb 6.4 oz (64.1 kg)    GEN: Well nourished, well developed in no acute distress HEENT: Normal, moist mucous membranes  NECK: No JVD CARDIAC: regular rhythm, normal S1 and S2, no rubs or gallops. No murmur. VASCULAR: Radial and DP pulses 2+ bilaterally. No carotid bruits RESPIRATORY:  Clear to auscultation without rales, wheezing or rhonchi  ABDOMEN: Soft, non-tender, non-distended MUSCULOSKELETAL:  Ambulates independently SKIN: Warm and dry, no edema NEUROLOGIC:  Alert and oriented x 3. No focal neuro deficits noted. PSYCHIATRIC:  Normal affect   ASSESSMENT:    1. Precordial pain   2. Aortic atherosclerosis ([place])   3. Essential hypertension   4. Cardiac risk counseling   5. Counseling on health promotion and disease prevention    PLAN:    History of Chest pain: unremarkable workup in 2019.  -now having nearly daily chest pain, worse after eating but not exclusively occurring at that time -discussed both cardiac and noncardiac causes of chest pain -given her risk factors, will repeat lexiscan myoview stress test Shared Decision Making/Informed Consent The risks [chest pain, shortness of breath, cardiac  arrhythmias, dizziness, blood pressure fluctuations, myocardial infarction, stroke/transient ischemic attack, nausea, vomiting, allergic reaction, radiation exposure, metallic taste sensation and life-threatening complications (estimated to be 1 in 10,000)], benefits (risk stratification, diagnosing coronary artery disease, treatment guidance) and alternatives of a nuclear stress test were discussed in detail with Ms. Stann Mainland and she agrees to proceed. -if nuclear stress unremarkable, would consider GI as next most likely cause given pattern and nitro responsiveness -counseled on red flag warning signs that need immediate medical attention  Hypertension: within reasonable goal today given her age -continue amlodipine, triamterene-HCTZ  Aortic atherosclerosis, history of TIA:  -continue atorvastation -we have discussed aspirin, but given age and potential GI concerns we elected to not start this. Would reassess if stress test abnormal  CV risk counseling and Secondary prevention (history of TIA) -recommend heart healthy/Mediterranean diet, with whole grains, fruits, vegetable, fish, lean meats, nuts, and olive oil. Limit salt. -recommend moderate walking, 3-5 times/week for 30-50 minutes each session. Aim for at least 150 minutes.week. Goal should be pace of 3 miles/hours, or walking 1.5 miles in 30 minutes -recommend avoidance of tobacco products. Avoid excess alcohol.  Plan for follow up: keep scheduled follow up, will follow up sooner if stress test abnormal  Medication Adjustments/Labs and Tests Ordered: Current medicines are reviewed at length with the patient today.  Concerns regarding medicines are outlined above.  Orders Placed This Encounter  Procedures  . Cardiac Stress Test: Informed Consent Details: Physician/Practitioner Attestation; Transcribe to consent form and obtain patient signature  . MYOCARDIAL PERFUSION IMAGING  . EKG 12-Lead   No orders of the defined types were placed  in this encounter.   Patient Instructions  Medication Instructions:  I suspect that the pantoprazole replaces the omeprazole (prilosec), but clarify with Dr. Jacelyn Grip.  What the medications are for: Albuterol--inhaler for wheezing Amlodipine--blood pressure medicine Atorvastatin--cholesterol pill Biotin (vitamin) Cholecalciferol (vitamin) Ferrous sulfate (iron pill) Krill oil (fish oil) Meloxicam--antiinflammatory, can upset the stomach Nitroglycerin--for chest pain Pantoprazole--acid reducer/stomach protector Tizanidine--muscle relaxer Trazodone--for sleep Triamterene-HCTZ--blood pressure medicine  *If you need a refill on your cardiac medications before your next appointment, please call your pharmacy*   Lab Work: None   Testing/Procedures: Your physician has requested that you have a lexiscan myoview. For further information please visit [email or web address]. Please follow instruction sheet, as given. [place]   Follow-Up: At [facility], you and your health needs are our priority.  As part of our continuing mission to provide you with exceptional heart care, we have created designated  Provider Care Teams.  These Care Teams include your primary Cardiologist (physician) and Advanced Practice Providers (APPs -  Physician Assistants and Nurse Practitioners) who all work together to provide you with the care you need, when you need it.  We recommend signing up for the patient portal called "MyChart".  Sign up information is provided on this After Visit Summary.  MyChart is used to connect with patients for Virtual Visits (Telemedicine).  Patients are able to view lab/test results, encounter notes, upcoming appointments, etc.  Non-urgent messages can be sent to your provider as well.   To learn more about what you can do with MyChart, go to [email or web address].    Your next appointment:   8 month(s) at [place] location  The format for your next  appointment:   In Person  Provider:   Buford Dresser, MD  You are scheduled for a Myocardial Perfusion Imaging Study on Please arrive 15 minutes prior to your appointment time for registration and insurance purposes.  The test will take approximately 3 to 4 hours to complete; you may bring reading material.  If someone comes with you to your appointment, they will need to remain in the main lobby due to limited space in the testing area. **If you are pregnant or breastfeeding, please notify the nuclear lab prior to your appointment**  How to prepare for your Myocardial Perfusion Test: . Do not eat or drink 3 hours prior to your test, except you may have water. . Do not consume products containing caffeine (regular or decaffeinated) 12 hours prior to your test. (ex: coffee, chocolate, sodas, tea). . Do bring a list of your current medications with you.  If not listed below, you may take your medications as normal. . Do wear comfortable clothes (no dresses or overalls) and walking shoes, tennis shoes preferred (No heels or open toe shoes are allowed). . Do NOT wear cologne, perfume, aftershave, or lotions (deodorant is allowed). . If these instructions are not followed, your test will have to be rescheduled.  Please report to [place], [address] for your test.  If you have questions or concerns about your appointment, you can call the Nuclear Lab at [phone].  If you cannot keep your appointment, please provide 24 hours notification to the Nuclear Lab, to avoid a possible $50 charge to your account.     Signed, Buford Dresser, MD PhD 08/13/2020 5:50 PM    [facility]

## 2020-08-22 ENCOUNTER — Ambulatory Visit (HOSPITAL_COMMUNITY)
Admission: RE | Admit: 2020-08-22 | Payer: Medicare Other | Source: Ambulatory Visit | Attending: Cardiology | Admitting: Cardiology

## 2020-08-28 ENCOUNTER — Telehealth (HOSPITAL_COMMUNITY): Payer: Self-pay | Admitting: *Deleted

## 2020-08-28 NOTE — Telephone Encounter (Signed)
Close encounter 

## 2020-08-29 ENCOUNTER — Ambulatory Visit (HOSPITAL_COMMUNITY)
Admission: RE | Admit: 2020-08-29 | Discharge: 2020-08-29 | Disposition: A | Discharge status: Home / Self Care | Payer: Medicare Other | Source: Ambulatory Visit | Attending: Internal Medicine | Admitting: Internal Medicine

## 2020-08-29 ENCOUNTER — Other Ambulatory Visit: Payer: Self-pay

## 2020-08-29 DIAGNOSIS — R072 Precordial pain: Secondary | ICD-10-CM | POA: Insufficient documentation

## 2020-08-29 LAB — MYOCARDIAL PERFUSION IMAGING
LV dias vol: 71 mL (ref 46–106)
LV sys vol: 34 mL
Peak HR: 110 {beats}/min
Rest HR: 74 {beats}/min
SDS: 4
SRS: 0
SSS: 4
TID: 1.04

## 2020-08-29 MED ORDER — REGADENOSON 0.4 MG/5ML IV SOLN
0.4000 mg | Freq: Once | INTRAVENOUS | Status: AC
  Administered 2020-08-29: 0.4 mg via INTRAVENOUS

## 2020-08-29 MED ORDER — TECHNETIUM TC 99M TETROFOSMIN IV KIT
31.8000 | PACK | Freq: Once | INTRAVENOUS | Status: AC | PRN
  Administered 2020-08-29: 31.8 via INTRAVENOUS
  Filled 2020-08-29: qty 32

## 2020-08-29 MED ORDER — TECHNETIUM TC 99M TETROFOSMIN IV KIT
9.8000 | PACK | Freq: Once | INTRAVENOUS | Status: AC | PRN
  Administered 2020-08-29: 9.8 via INTRAVENOUS
  Filled 2020-08-29: qty 10

## 2020-11-07 DIAGNOSIS — R053 Chronic cough: Secondary | ICD-10-CM | POA: Diagnosis not present

## 2020-11-07 DIAGNOSIS — F439 Reaction to severe stress, unspecified: Secondary | ICD-10-CM | POA: Diagnosis not present

## 2020-11-07 DIAGNOSIS — G4700 Insomnia, unspecified: Secondary | ICD-10-CM | POA: Diagnosis not present

## 2020-11-07 DIAGNOSIS — R079 Chest pain, unspecified: Secondary | ICD-10-CM | POA: Diagnosis not present

## 2020-11-07 DIAGNOSIS — E7841 Elevated Lipoprotein(a): Secondary | ICD-10-CM | POA: Diagnosis not present

## 2020-11-07 DIAGNOSIS — G14 Postpolio syndrome: Secondary | ICD-10-CM | POA: Diagnosis not present

## 2020-11-07 DIAGNOSIS — R410 Disorientation, unspecified: Secondary | ICD-10-CM | POA: Diagnosis not present

## 2020-11-07 DIAGNOSIS — I10 Essential (primary) hypertension: Secondary | ICD-10-CM | POA: Diagnosis not present

## 2020-11-07 DIAGNOSIS — D649 Anemia, unspecified: Secondary | ICD-10-CM | POA: Diagnosis not present

## 2020-11-07 DIAGNOSIS — R296 Repeated falls: Secondary | ICD-10-CM | POA: Diagnosis not present

## 2020-11-07 DIAGNOSIS — E785 Hyperlipidemia, unspecified: Secondary | ICD-10-CM | POA: Diagnosis not present

## 2020-11-07 DIAGNOSIS — M353 Polymyalgia rheumatica: Secondary | ICD-10-CM | POA: Diagnosis not present

## 2020-11-10 ENCOUNTER — Other Ambulatory Visit: Payer: Self-pay | Admitting: Family Medicine

## 2020-11-10 DIAGNOSIS — R410 Disorientation, unspecified: Secondary | ICD-10-CM

## 2020-12-09 DIAGNOSIS — F439 Reaction to severe stress, unspecified: Secondary | ICD-10-CM | POA: Diagnosis not present

## 2020-12-09 DIAGNOSIS — G14 Postpolio syndrome: Secondary | ICD-10-CM | POA: Diagnosis not present

## 2020-12-09 DIAGNOSIS — R053 Chronic cough: Secondary | ICD-10-CM | POA: Diagnosis not present

## 2020-12-09 DIAGNOSIS — G4700 Insomnia, unspecified: Secondary | ICD-10-CM | POA: Diagnosis not present

## 2020-12-09 DIAGNOSIS — Z79899 Other long term (current) drug therapy: Secondary | ICD-10-CM | POA: Diagnosis not present

## 2020-12-09 DIAGNOSIS — D649 Anemia, unspecified: Secondary | ICD-10-CM | POA: Diagnosis not present

## 2020-12-09 DIAGNOSIS — R410 Disorientation, unspecified: Secondary | ICD-10-CM | POA: Diagnosis not present

## 2020-12-09 DIAGNOSIS — R296 Repeated falls: Secondary | ICD-10-CM | POA: Diagnosis not present

## 2020-12-09 DIAGNOSIS — M353 Polymyalgia rheumatica: Secondary | ICD-10-CM | POA: Diagnosis not present

## 2020-12-09 DIAGNOSIS — I10 Essential (primary) hypertension: Secondary | ICD-10-CM | POA: Diagnosis not present

## 2020-12-09 DIAGNOSIS — R079 Chest pain, unspecified: Secondary | ICD-10-CM | POA: Diagnosis not present

## 2020-12-09 DIAGNOSIS — E785 Hyperlipidemia, unspecified: Secondary | ICD-10-CM | POA: Diagnosis not present

## 2020-12-22 ENCOUNTER — Other Ambulatory Visit: Payer: Medicare Other

## 2020-12-24 DIAGNOSIS — Z23 Encounter for immunization: Secondary | ICD-10-CM | POA: Diagnosis not present

## 2021-01-03 ENCOUNTER — Other Ambulatory Visit: Payer: Self-pay

## 2021-01-03 ENCOUNTER — Ambulatory Visit
Admission: RE | Admit: 2021-01-03 | Discharge: 2021-01-03 | Disposition: A | Discharge status: Home / Self Care | Payer: Medicare Other | Source: Ambulatory Visit | Attending: Family Medicine | Admitting: Family Medicine

## 2021-01-03 DIAGNOSIS — R410 Disorientation, unspecified: Secondary | ICD-10-CM | POA: Diagnosis not present

## 2021-01-03 DIAGNOSIS — I6782 Cerebral ischemia: Secondary | ICD-10-CM | POA: Diagnosis not present

## 2021-01-09 ENCOUNTER — Other Ambulatory Visit: Payer: Medicare Other

## 2021-01-18 DIAGNOSIS — M159 Polyosteoarthritis, unspecified: Secondary | ICD-10-CM | POA: Diagnosis not present

## 2021-01-18 DIAGNOSIS — K219 Gastro-esophageal reflux disease without esophagitis: Secondary | ICD-10-CM | POA: Diagnosis not present

## 2021-01-18 DIAGNOSIS — G4700 Insomnia, unspecified: Secondary | ICD-10-CM | POA: Diagnosis not present

## 2021-01-18 DIAGNOSIS — E785 Hyperlipidemia, unspecified: Secondary | ICD-10-CM | POA: Diagnosis not present

## 2021-01-18 DIAGNOSIS — I10 Essential (primary) hypertension: Secondary | ICD-10-CM | POA: Diagnosis not present

## 2021-01-18 DIAGNOSIS — D509 Iron deficiency anemia, unspecified: Secondary | ICD-10-CM | POA: Diagnosis not present

## 2021-01-18 DIAGNOSIS — J4531 Mild persistent asthma with (acute) exacerbation: Secondary | ICD-10-CM | POA: Diagnosis not present

## 2021-01-30 DIAGNOSIS — E785 Hyperlipidemia, unspecified: Secondary | ICD-10-CM | POA: Diagnosis not present

## 2021-01-30 DIAGNOSIS — G14 Postpolio syndrome: Secondary | ICD-10-CM | POA: Diagnosis not present

## 2021-01-30 DIAGNOSIS — F5101 Primary insomnia: Secondary | ICD-10-CM | POA: Diagnosis not present

## 2021-01-30 DIAGNOSIS — I10 Essential (primary) hypertension: Secondary | ICD-10-CM | POA: Diagnosis not present

## 2021-01-30 DIAGNOSIS — J4531 Mild persistent asthma with (acute) exacerbation: Secondary | ICD-10-CM | POA: Diagnosis not present

## 2021-01-30 DIAGNOSIS — R053 Chronic cough: Secondary | ICD-10-CM | POA: Diagnosis not present

## 2021-01-30 DIAGNOSIS — K219 Gastro-esophageal reflux disease without esophagitis: Secondary | ICD-10-CM | POA: Diagnosis not present

## 2021-02-04 DIAGNOSIS — Z1389 Encounter for screening for other disorder: Secondary | ICD-10-CM | POA: Diagnosis not present

## 2021-02-04 DIAGNOSIS — Z0000 Encounter for general adult medical examination without abnormal findings: Secondary | ICD-10-CM | POA: Diagnosis not present

## 2021-02-13 DIAGNOSIS — M25552 Pain in left hip: Secondary | ICD-10-CM | POA: Diagnosis not present

## 2021-02-13 DIAGNOSIS — M25562 Pain in left knee: Secondary | ICD-10-CM | POA: Diagnosis not present

## 2021-02-13 DIAGNOSIS — R531 Weakness: Secondary | ICD-10-CM | POA: Diagnosis not present

## 2021-02-13 DIAGNOSIS — M25512 Pain in left shoulder: Secondary | ICD-10-CM | POA: Diagnosis not present

## 2021-02-13 DIAGNOSIS — M25561 Pain in right knee: Secondary | ICD-10-CM | POA: Diagnosis not present

## 2021-02-13 DIAGNOSIS — R296 Repeated falls: Secondary | ICD-10-CM | POA: Diagnosis not present

## 2021-02-13 DIAGNOSIS — M25551 Pain in right hip: Secondary | ICD-10-CM | POA: Diagnosis not present

## 2021-02-16 DIAGNOSIS — R296 Repeated falls: Secondary | ICD-10-CM | POA: Diagnosis not present

## 2021-02-16 DIAGNOSIS — M25552 Pain in left hip: Secondary | ICD-10-CM | POA: Diagnosis not present

## 2021-02-16 DIAGNOSIS — M25551 Pain in right hip: Secondary | ICD-10-CM | POA: Diagnosis not present

## 2021-02-16 DIAGNOSIS — M25561 Pain in right knee: Secondary | ICD-10-CM | POA: Diagnosis not present

## 2021-02-16 DIAGNOSIS — R531 Weakness: Secondary | ICD-10-CM | POA: Diagnosis not present

## 2021-02-16 DIAGNOSIS — M25562 Pain in left knee: Secondary | ICD-10-CM | POA: Diagnosis not present

## 2021-02-17 DIAGNOSIS — M810 Age-related osteoporosis without current pathological fracture: Secondary | ICD-10-CM | POA: Diagnosis not present

## 2021-02-17 DIAGNOSIS — M159 Polyosteoarthritis, unspecified: Secondary | ICD-10-CM | POA: Diagnosis not present

## 2021-02-17 DIAGNOSIS — D649 Anemia, unspecified: Secondary | ICD-10-CM | POA: Diagnosis not present

## 2021-02-17 DIAGNOSIS — J4531 Mild persistent asthma with (acute) exacerbation: Secondary | ICD-10-CM | POA: Diagnosis not present

## 2021-02-17 DIAGNOSIS — D509 Iron deficiency anemia, unspecified: Secondary | ICD-10-CM | POA: Diagnosis not present

## 2021-02-17 DIAGNOSIS — G4700 Insomnia, unspecified: Secondary | ICD-10-CM | POA: Diagnosis not present

## 2021-02-17 DIAGNOSIS — E785 Hyperlipidemia, unspecified: Secondary | ICD-10-CM | POA: Diagnosis not present

## 2021-02-17 DIAGNOSIS — I10 Essential (primary) hypertension: Secondary | ICD-10-CM | POA: Diagnosis not present

## 2021-02-17 DIAGNOSIS — K219 Gastro-esophageal reflux disease without esophagitis: Secondary | ICD-10-CM | POA: Diagnosis not present

## 2021-02-20 DIAGNOSIS — R296 Repeated falls: Secondary | ICD-10-CM | POA: Diagnosis not present

## 2021-02-20 DIAGNOSIS — M25552 Pain in left hip: Secondary | ICD-10-CM | POA: Diagnosis not present

## 2021-02-20 DIAGNOSIS — R531 Weakness: Secondary | ICD-10-CM | POA: Diagnosis not present

## 2021-02-20 DIAGNOSIS — M25561 Pain in right knee: Secondary | ICD-10-CM | POA: Diagnosis not present

## 2021-02-20 DIAGNOSIS — M25562 Pain in left knee: Secondary | ICD-10-CM | POA: Diagnosis not present

## 2021-02-20 DIAGNOSIS — M25551 Pain in right hip: Secondary | ICD-10-CM | POA: Diagnosis not present

## 2021-02-24 DIAGNOSIS — R531 Weakness: Secondary | ICD-10-CM | POA: Diagnosis not present

## 2021-02-24 DIAGNOSIS — M25551 Pain in right hip: Secondary | ICD-10-CM | POA: Diagnosis not present

## 2021-02-24 DIAGNOSIS — M25562 Pain in left knee: Secondary | ICD-10-CM | POA: Diagnosis not present

## 2021-02-24 DIAGNOSIS — M25561 Pain in right knee: Secondary | ICD-10-CM | POA: Diagnosis not present

## 2021-02-24 DIAGNOSIS — R296 Repeated falls: Secondary | ICD-10-CM | POA: Diagnosis not present

## 2021-02-24 DIAGNOSIS — M25552 Pain in left hip: Secondary | ICD-10-CM | POA: Diagnosis not present

## 2021-02-26 DIAGNOSIS — M25561 Pain in right knee: Secondary | ICD-10-CM | POA: Diagnosis not present

## 2021-02-26 DIAGNOSIS — R531 Weakness: Secondary | ICD-10-CM | POA: Diagnosis not present

## 2021-02-26 DIAGNOSIS — M25562 Pain in left knee: Secondary | ICD-10-CM | POA: Diagnosis not present

## 2021-02-26 DIAGNOSIS — R296 Repeated falls: Secondary | ICD-10-CM | POA: Diagnosis not present

## 2021-02-26 DIAGNOSIS — M25552 Pain in left hip: Secondary | ICD-10-CM | POA: Diagnosis not present

## 2021-02-26 DIAGNOSIS — M25551 Pain in right hip: Secondary | ICD-10-CM | POA: Diagnosis not present

## 2021-03-04 DIAGNOSIS — M25551 Pain in right hip: Secondary | ICD-10-CM | POA: Diagnosis not present

## 2021-03-04 DIAGNOSIS — M25512 Pain in left shoulder: Secondary | ICD-10-CM | POA: Diagnosis not present

## 2021-03-04 DIAGNOSIS — M25562 Pain in left knee: Secondary | ICD-10-CM | POA: Diagnosis not present

## 2021-03-04 DIAGNOSIS — M25552 Pain in left hip: Secondary | ICD-10-CM | POA: Diagnosis not present

## 2021-03-04 DIAGNOSIS — R296 Repeated falls: Secondary | ICD-10-CM | POA: Diagnosis not present

## 2021-03-04 DIAGNOSIS — R531 Weakness: Secondary | ICD-10-CM | POA: Diagnosis not present

## 2021-03-04 DIAGNOSIS — M25561 Pain in right knee: Secondary | ICD-10-CM | POA: Diagnosis not present

## 2021-03-13 DIAGNOSIS — M25551 Pain in right hip: Secondary | ICD-10-CM | POA: Diagnosis not present

## 2021-03-13 DIAGNOSIS — M25552 Pain in left hip: Secondary | ICD-10-CM | POA: Diagnosis not present

## 2021-03-13 DIAGNOSIS — R296 Repeated falls: Secondary | ICD-10-CM | POA: Diagnosis not present

## 2021-03-13 DIAGNOSIS — R531 Weakness: Secondary | ICD-10-CM | POA: Diagnosis not present

## 2021-03-13 DIAGNOSIS — M25562 Pain in left knee: Secondary | ICD-10-CM | POA: Diagnosis not present

## 2021-03-13 DIAGNOSIS — M25561 Pain in right knee: Secondary | ICD-10-CM | POA: Diagnosis not present

## 2021-03-18 DIAGNOSIS — M25561 Pain in right knee: Secondary | ICD-10-CM | POA: Diagnosis not present

## 2021-03-18 DIAGNOSIS — R296 Repeated falls: Secondary | ICD-10-CM | POA: Diagnosis not present

## 2021-03-18 DIAGNOSIS — M25552 Pain in left hip: Secondary | ICD-10-CM | POA: Diagnosis not present

## 2021-03-18 DIAGNOSIS — M25562 Pain in left knee: Secondary | ICD-10-CM | POA: Diagnosis not present

## 2021-03-18 DIAGNOSIS — M25551 Pain in right hip: Secondary | ICD-10-CM | POA: Diagnosis not present

## 2021-03-18 DIAGNOSIS — R531 Weakness: Secondary | ICD-10-CM | POA: Diagnosis not present

## 2021-03-20 DIAGNOSIS — M25562 Pain in left knee: Secondary | ICD-10-CM | POA: Diagnosis not present

## 2021-03-20 DIAGNOSIS — M25561 Pain in right knee: Secondary | ICD-10-CM | POA: Diagnosis not present

## 2021-03-20 DIAGNOSIS — R296 Repeated falls: Secondary | ICD-10-CM | POA: Diagnosis not present

## 2021-03-20 DIAGNOSIS — R531 Weakness: Secondary | ICD-10-CM | POA: Diagnosis not present

## 2021-03-20 DIAGNOSIS — M25551 Pain in right hip: Secondary | ICD-10-CM | POA: Diagnosis not present

## 2021-03-20 DIAGNOSIS — M25552 Pain in left hip: Secondary | ICD-10-CM | POA: Diagnosis not present

## 2021-03-23 DIAGNOSIS — M25551 Pain in right hip: Secondary | ICD-10-CM | POA: Diagnosis not present

## 2021-03-23 DIAGNOSIS — R531 Weakness: Secondary | ICD-10-CM | POA: Diagnosis not present

## 2021-03-23 DIAGNOSIS — M25561 Pain in right knee: Secondary | ICD-10-CM | POA: Diagnosis not present

## 2021-03-23 DIAGNOSIS — M25552 Pain in left hip: Secondary | ICD-10-CM | POA: Diagnosis not present

## 2021-03-23 DIAGNOSIS — R296 Repeated falls: Secondary | ICD-10-CM | POA: Diagnosis not present

## 2021-03-23 DIAGNOSIS — M25562 Pain in left knee: Secondary | ICD-10-CM | POA: Diagnosis not present

## 2021-03-25 DIAGNOSIS — M25552 Pain in left hip: Secondary | ICD-10-CM | POA: Diagnosis not present

## 2021-03-25 DIAGNOSIS — M25551 Pain in right hip: Secondary | ICD-10-CM | POA: Diagnosis not present

## 2021-03-25 DIAGNOSIS — R531 Weakness: Secondary | ICD-10-CM | POA: Diagnosis not present

## 2021-03-25 DIAGNOSIS — M25562 Pain in left knee: Secondary | ICD-10-CM | POA: Diagnosis not present

## 2021-03-25 DIAGNOSIS — M25561 Pain in right knee: Secondary | ICD-10-CM | POA: Diagnosis not present

## 2021-03-25 DIAGNOSIS — R296 Repeated falls: Secondary | ICD-10-CM | POA: Diagnosis not present

## 2021-04-02 DIAGNOSIS — H401232 Low-tension glaucoma, bilateral, moderate stage: Secondary | ICD-10-CM | POA: Diagnosis not present

## 2021-04-02 DIAGNOSIS — Z961 Presence of intraocular lens: Secondary | ICD-10-CM | POA: Diagnosis not present

## 2021-04-02 DIAGNOSIS — H1045 Other chronic allergic conjunctivitis: Secondary | ICD-10-CM | POA: Diagnosis not present

## 2021-04-02 DIAGNOSIS — H04123 Dry eye syndrome of bilateral lacrimal glands: Secondary | ICD-10-CM | POA: Diagnosis not present

## 2021-04-16 DIAGNOSIS — E785 Hyperlipidemia, unspecified: Secondary | ICD-10-CM | POA: Diagnosis not present

## 2021-04-30 DIAGNOSIS — R053 Chronic cough: Secondary | ICD-10-CM | POA: Diagnosis not present

## 2021-04-30 DIAGNOSIS — G14 Postpolio syndrome: Secondary | ICD-10-CM | POA: Diagnosis not present

## 2021-04-30 DIAGNOSIS — I10 Essential (primary) hypertension: Secondary | ICD-10-CM | POA: Diagnosis not present

## 2021-04-30 DIAGNOSIS — K219 Gastro-esophageal reflux disease without esophagitis: Secondary | ICD-10-CM | POA: Diagnosis not present

## 2021-04-30 DIAGNOSIS — M79605 Pain in left leg: Secondary | ICD-10-CM | POA: Diagnosis not present

## 2021-04-30 DIAGNOSIS — J4531 Mild persistent asthma with (acute) exacerbation: Secondary | ICD-10-CM | POA: Diagnosis not present

## 2021-04-30 DIAGNOSIS — E785 Hyperlipidemia, unspecified: Secondary | ICD-10-CM | POA: Diagnosis not present

## 2021-04-30 DIAGNOSIS — Z23 Encounter for immunization: Secondary | ICD-10-CM | POA: Diagnosis not present

## 2021-04-30 DIAGNOSIS — F5101 Primary insomnia: Secondary | ICD-10-CM | POA: Diagnosis not present

## 2021-07-01 DIAGNOSIS — E785 Hyperlipidemia, unspecified: Secondary | ICD-10-CM | POA: Diagnosis not present

## 2021-07-01 DIAGNOSIS — J4531 Mild persistent asthma with (acute) exacerbation: Secondary | ICD-10-CM | POA: Diagnosis not present

## 2021-07-01 DIAGNOSIS — R053 Chronic cough: Secondary | ICD-10-CM | POA: Diagnosis not present

## 2021-07-13 ENCOUNTER — Other Ambulatory Visit: Payer: Self-pay

## 2021-07-13 ENCOUNTER — Ambulatory Visit (INDEPENDENT_AMBULATORY_CARE_PROVIDER_SITE_OTHER): Payer: Medicare Other | Admitting: Cardiology

## 2021-07-13 ENCOUNTER — Encounter (HOSPITAL_BASED_OUTPATIENT_CLINIC_OR_DEPARTMENT_OTHER): Payer: Self-pay | Admitting: Cardiology

## 2021-07-13 VITALS — BP 126/84 | HR 89 | Ht 64.0 in | Wt 140.2 lb

## 2021-07-13 DIAGNOSIS — Z7189 Other specified counseling: Secondary | ICD-10-CM

## 2021-07-13 DIAGNOSIS — I10 Essential (primary) hypertension: Secondary | ICD-10-CM

## 2021-07-13 DIAGNOSIS — R072 Precordial pain: Secondary | ICD-10-CM

## 2021-07-13 DIAGNOSIS — I700 Atherosclerosis of aorta: Secondary | ICD-10-CM | POA: Diagnosis not present

## 2021-07-13 MED ORDER — NITROGLYCERIN 0.4 MG SL SUBL: 0.4000 mg | SUBLINGUAL_TABLET | SUBLINGUAL | 3 refills | Status: DC | PRN

## 2021-07-13 NOTE — Progress Notes (Signed)
Cardiology Office Note:    Date:  07/13/2021   ID:  Jacqueline Foley, DOB Sep 28, 1932, MRN [id number]  PCP:  Vernie Shanks, MD  Cardiologist:  Buford Dresser, MD PhD  Referring MD: Vernie Shanks, MD   CC: follow up  History of Present Illness:    Jacqueline Foley is a 88 y.o. female with a hx of chest pain, diabetes type II, hypertension, GERD, TIA, HLD who is seen for follow up. I initially saw her as a new consult at the request of Vernie Shanks, MD for the evaluation and management of chest pain and recent hospital evaluation on 02/17/18.  Cardiac history: Was seen previously at [facility] (not in Care Everywhere)--worked at [employer] for 26 years, then moved to [place] for 5-6 years ([place] [facility], [employer]). She saw a cardiologist in [place] in the [facility] main hospital at that time. She was put on lasix but unsure what other testing was done at the time. No cath. Thinks possibly an echo. Never told there were any issues with her heart. In 10/2017 developed intermittent left chest discomfort, pressure, both at rest and exertional, improved with NG. She presented to the ER and was admitted for observation. Troponins were negative x3, echo was normal, nuclear lexiscan was low risk. CTPE negative (d-dimer was elevated).  Today: She is accompanied by her daughter. Overall, she is feeling good.  Sometimes she develops left-sided chest pain that she describes as a pressure. She believes this is related to acid reflux. This chest pressure mostly occurs at night. Propping her head up with pillows at night seems to help.  In the morning she occasionally has dizziness or feels like she is staggering when walking after she gets out of bed.   Occasionally she notices LE edema.  For exercise she regularly walks a few laps outside of her house. In her diet she has been using salt substitutes.  She denies any palpitations, or shortness of breath. No headaches, syncope,  orthopnea, or PND.   Past Medical History:  Diagnosis Date   Chest pain at rest, negative MI, negative stress test, may have been GI 12/06/2013   Diabetes mellitus without complication ([place])    DOE (dyspnea on exertion), may be due to HTN 12/06/2013   GERD (gastroesophageal reflux disease)    H/O diabetes mellitus    HTN (hypertension) 12/06/2013   Hypercholesteremia    Hypertension    TIA (transient ischemic attack)     Past Surgical History:  Procedure Laterality Date   CATARACT EXTRACTION, BILATERAL  2018   EYE SURGERY     FOOT SURGERY      Current Medications: Current Outpatient Medications on File Prior to Visit  Medication Sig   albuterol (PROVENTIL HFA;VENTOLIN HFA) 108 (90 Base) MCG/ACT inhaler Inhale 2 puffs into the lungs every 6 (six) hours as needed for wheezing or shortness of breath.   Albuterol Sulfate 2.5 MG/0.5ML NEBU as directed.   amLODipine (NORVASC) 2.5 MG tablet Take 2.5 mg by mouth daily.   atorvastatin (LIPITOR) 20 MG tablet Take 20 mg by mouth daily.    Biotin 5 MG CAPS Take 1 capsule by mouth daily.   Cholecalciferol (VITAMIN D3 PO) Take 1 tablet by mouth daily. 1000 units = 1 tablet   doxepin (SINEQUAN) 25 MG capsule Take 1 capsule by mouth at bedtime.   famotidine (PEPCID) 10 MG tablet Take 1 tablet by mouth as needed.   ferrous sulfate 325 (65 FE) MG  tablet Take 325 mg by mouth daily with breakfast.   fluticasone (FLONASE) 50 MCG/ACT nasal spray Place 1 spray into both nostrils daily as needed.   MEGARED OMEGA-3 KRILL OIL 500 MG CAPS Take 1 capsule by mouth daily.   meloxicam (MOBIC) 15 MG tablet Take 1 tablet by mouth daily as needed.   tiZANidine (ZANAFLEX) 2 MG tablet Take 2 mg by mouth 2 (two) times daily.   traZODone (DESYREL) 50 MG tablet Take 50 mg by mouth at bedtime.   triamterene-hydrochlorothiazide (MAXZIDE-25) 37.5-25 MG tablet Take 1 tablet by mouth daily.   No current facility-administered medications on file prior to visit.      Allergies:   Tomato   Social History   Tobacco Use   Smoking status: Never   Smokeless tobacco: Never  Vaping Use   Vaping Use: Never used  Substance Use Topics   Alcohol use: Yes    Comment: wine during some holidays   Drug use: No    Family History: The patient's family history includes Diabetes in her mother; Heart disease in her mother; Hypertension in her mother; Prostate cancer in her father.  ROS:   Please see the history of present illness.   (+) Chest pressure (+) Acid reflux (+) Dizziness (+) Gait instability (+) LE edema Additional pertinent ROS otherwise unremarkable.  EKGs/Labs/Other Studies Reviewed:    The following studies were reviewed today:  NM Lexiscan 08/29/2020: The left ventricular ejection fraction is mildly decreased (45-54%). Nuclear stress EF: 52%. There was no ST segment deviation noted during stress. No T wave inversion was noted during stress. The study is normal. This is a low risk study.  NM lexiscan 11/09/17 FINDINGS: Perfusion: Decreased activity noted inferiorly on both rest and stress images most compatible with diaphragmatic attenuation.   Wall Motion: Normal left ventricular wall motion. No left ventricular dilation.   Left Ventricular Ejection Fraction: 57 %  End diastolic volume 68 ml  End systolic volume 29 ml   IMPRESSION: 1. No reversible ischemia or infarction.  2. Normal left ventricular wall motion.  3. Left ventricular ejection fraction 57%  4. Non invasive risk stratification*: Low  Echo 11/08/17 Study Conclusions  - Left ventricle: The cavity size was normal. Wall thickness was   normal. Systolic function was normal. The estimated ejection   fraction was in the range of 60% to 65%. The study is not   technically sufficient to allow evaluation of LV diastolic   function. No significant valve disease on my review  CTPE 11/08/17 Cardiovascular: Thoracic aorta demonstrates mild  atherosclerotic calcification. No aneurysmal dilatation is noted. No significant opacification to evaluate for dissection is seen. No cardiac enlargement is noted. Mild coronary calcifications are noted. The pulmonary artery shows a normal branching pattern without evidence of filling defect to suggest pulmonary embolism. IMPRESSION: No evidence of pulmonary emboli.   Minimal dependent atelectatic changes.   Aortic Atherosclerosis (ICD10-I70.0).  EKG:  EKG is personally reviewed today.   07/13/2021: NSR at 89 bpm with a PVC 08/13/2020: normal sinus rhythm with 2 PVCs at 85 bpm  Recent Labs: No results found for requested labs within last 8760 hours.   Recent Lipid Panel    Component Value Date/Time   CHOL 154 12/06/2013 0740   TRIG 148 12/06/2013 0740   HDL 45 12/06/2013 0740   CHOLHDL 3.4 12/06/2013 0740   VLDL 30 12/06/2013 0740   LDLCALC 79 12/06/2013 0740    Physical Exam:    VS:  BP 126/84 (  BP Location: Right Arm, Patient Position: Sitting, Cuff Size: Normal)    Pulse 89    Ht 5\' 4"  (1.626 m)    Wt 140 lb 3.2 oz (63.6 kg)    BMI 24.07 kg/m     Wt Readings from Last 3 Encounters:  07/13/21 140 lb 3.2 oz (63.6 kg)  08/29/20 130 lb (59 kg)  08/13/20 130 lb 6.4 oz (59.1 kg)    GEN: Well nourished, well developed in no acute distress HEENT: Normal, moist mucous membranes NECK: No JVD CARDIAC: regular rhythm, normal S1 and S2, no rubs or gallops. No murmur. VASCULAR: Radial and DP pulses 2+ bilaterally. No carotid bruits RESPIRATORY:  Clear to auscultation without rales, wheezing or rhonchi  ABDOMEN: Soft, non-tender, non-distended MUSCULOSKELETAL:  Ambulates independently SKIN: Warm and dry, no edema NEUROLOGIC:  Alert and oriented x 3. No focal neuro deficits noted. PSYCHIATRIC:  Normal affect   ASSESSMENT:    1. Precordial pain   2. Aortic atherosclerosis ([place])   3. Essential hypertension   4. Cardiac risk counseling   5. Counseling on health promotion and  disease prevention     PLAN:    History of Chest pain:  -stress test low risk -worse at night/laying in bed -not currently on an acid reducer, was on protonix in the past, recently recommended for famotidine PRN but has not use.  -counseled on red flag warning signs that need immediate medical attention  Hypertension: within reasonable goal today given her age -continue amlodipine, triamterene-HCTZ  Aortic atherosclerosis, history of TIA:  -continue atorvastation -we have discussed aspirin, but given age and potential GI concerns we elected to not start this.   CV risk counseling and Secondary prevention (history of TIA) -recommend heart healthy/Mediterranean diet, with whole grains, fruits, vegetable, fish, lean meats, nuts, and olive oil. Limit salt. -recommend moderate walking, 3-5 times/week for 30-50 minutes each session. Aim for at least 150 minutes.week. Goal should be pace of 3 miles/hours, or walking 1.5 miles in 30 minutes -recommend avoidance of tobacco products. Avoid excess alcohol.  Plan for follow up: 1 year or sooner as needed, patient preference  Medication Adjustments/Labs and Tests Ordered: Current medicines are reviewed at length with the patient today.  Concerns regarding medicines are outlined above.   Orders Placed This Encounter  Procedures   EKG 12-Lead   Meds ordered this encounter  Medications   nitroGLYCERIN (NITROSTAT) 0.4 MG SL tablet    Sig: Place 1 tablet (0.4 mg total) under the tongue every 5 (five) minutes as needed for chest pain.    Dispense:  25 tablet    Refill:  3   Patient Instructions  Medication Instructions:  Your Physician recommend you continue on your current medication as directed.    *If you need a refill on your cardiac medications before your next appointment, please call your pharmacy*   Lab Work: None ordered today   Testing/Procedures: None ordered today   Follow-Up: At [facility], you and your health  needs are our priority.  As part of our continuing mission to provide you with exceptional heart care, we have created designated Provider Care Teams.  These Care Teams include your primary Cardiologist (physician) and Advanced Practice Providers (APPs -  Physician Assistants and Nurse Practitioners) who all work together to provide you with the care you need, when you need it.  We recommend signing up for the patient portal called "MyChart".  Sign up information is provided on this After Visit Summary.  MyChart  is used to connect with patients for Virtual Visits (Telemedicine).  Patients are able to view lab/test results, encounter notes, upcoming appointments, etc.  Non-urgent messages can be sent to your provider as well.   To learn more about what you can do with MyChart, go to [email or web address].    Your next appointment:   1 year(s)  The format for your next appointment:   In Person  Provider:   Buford Dresser, MD      [facility] Stump[facility],acting as a scribe for Buford Dresser, MD.,have documented all relevant documentation on the behalf of Buford Dresser, MD,as directed by  Buford Dresser, MD while in the presence of Buford Dresser, MD.  I, Buford Dresser, MD, have reviewed all documentation for this visit. The documentation on 07/13/21 for the exam, diagnosis, procedures, and orders are all accurate and complete.   Signed, Buford Dresser, MD PhD 07/13/2021 6:05 PM    [place]

## 2021-07-13 NOTE — Patient Instructions (Signed)

## 2021-08-28 DIAGNOSIS — M810 Age-related osteoporosis without current pathological fracture: Secondary | ICD-10-CM | POA: Diagnosis not present

## 2021-08-28 DIAGNOSIS — D649 Anemia, unspecified: Secondary | ICD-10-CM | POA: Diagnosis not present

## 2021-08-28 DIAGNOSIS — J4531 Mild persistent asthma with (acute) exacerbation: Secondary | ICD-10-CM | POA: Diagnosis not present

## 2021-08-28 DIAGNOSIS — G4700 Insomnia, unspecified: Secondary | ICD-10-CM | POA: Diagnosis not present

## 2021-08-28 DIAGNOSIS — I10 Essential (primary) hypertension: Secondary | ICD-10-CM | POA: Diagnosis not present

## 2021-08-28 DIAGNOSIS — K219 Gastro-esophageal reflux disease without esophagitis: Secondary | ICD-10-CM | POA: Diagnosis not present

## 2021-08-28 DIAGNOSIS — E785 Hyperlipidemia, unspecified: Secondary | ICD-10-CM | POA: Diagnosis not present

## 2021-08-28 DIAGNOSIS — D509 Iron deficiency anemia, unspecified: Secondary | ICD-10-CM | POA: Diagnosis not present

## 2021-08-28 DIAGNOSIS — M159 Polyosteoarthritis, unspecified: Secondary | ICD-10-CM | POA: Diagnosis not present

## 2021-09-03 DIAGNOSIS — R053 Chronic cough: Secondary | ICD-10-CM | POA: Diagnosis not present

## 2021-09-03 DIAGNOSIS — I10 Essential (primary) hypertension: Secondary | ICD-10-CM | POA: Diagnosis not present

## 2021-09-03 DIAGNOSIS — R29898 Other symptoms and signs involving the musculoskeletal system: Secondary | ICD-10-CM | POA: Diagnosis not present

## 2021-09-03 DIAGNOSIS — G14 Postpolio syndrome: Secondary | ICD-10-CM | POA: Diagnosis not present

## 2021-09-03 DIAGNOSIS — M7989 Other specified soft tissue disorders: Secondary | ICD-10-CM | POA: Diagnosis not present

## 2021-09-03 DIAGNOSIS — M79605 Pain in left leg: Secondary | ICD-10-CM | POA: Diagnosis not present

## 2021-09-03 DIAGNOSIS — J4531 Mild persistent asthma with (acute) exacerbation: Secondary | ICD-10-CM | POA: Diagnosis not present

## 2021-09-03 DIAGNOSIS — M25471 Effusion, right ankle: Secondary | ICD-10-CM | POA: Diagnosis not present

## 2021-09-03 DIAGNOSIS — K219 Gastro-esophageal reflux disease without esophagitis: Secondary | ICD-10-CM | POA: Diagnosis not present

## 2021-09-03 DIAGNOSIS — E785 Hyperlipidemia, unspecified: Secondary | ICD-10-CM | POA: Diagnosis not present

## 2021-09-03 DIAGNOSIS — F5101 Primary insomnia: Secondary | ICD-10-CM | POA: Diagnosis not present

## 2021-09-29 DIAGNOSIS — H1045 Other chronic allergic conjunctivitis: Secondary | ICD-10-CM | POA: Diagnosis not present

## 2021-09-29 DIAGNOSIS — Z961 Presence of intraocular lens: Secondary | ICD-10-CM | POA: Diagnosis not present

## 2021-09-29 DIAGNOSIS — H04123 Dry eye syndrome of bilateral lacrimal glands: Secondary | ICD-10-CM | POA: Diagnosis not present

## 2021-09-29 DIAGNOSIS — H401232 Low-tension glaucoma, bilateral, moderate stage: Secondary | ICD-10-CM | POA: Diagnosis not present

## 2021-10-21 DIAGNOSIS — S0003XA Contusion of scalp, initial encounter: Secondary | ICD-10-CM | POA: Diagnosis not present

## 2021-10-21 DIAGNOSIS — W19XXXA Unspecified fall, initial encounter: Secondary | ICD-10-CM | POA: Diagnosis not present

## 2021-10-31 IMAGING — CT CT HEAD W/O CM
1 series · 15 of 30 positions shown, 19 images · non-contrast
Comparison: Head MRI 08/14/2017

CLINICAL DATA: Intermittent confusion. History of post-polio
syndrome.

EXAM:
CT HEAD WITHOUT CONTRAST
TECHNIQUE: Contiguous axial images were obtained from the base of the skull
through the vertex without intravenous contrast.

[Series 2: head w/(date) · axial · 0.44mm/px · z∈[-176,-26]mm · 15 of 34 slices shown, 19 images]
[im 2/34  brain]
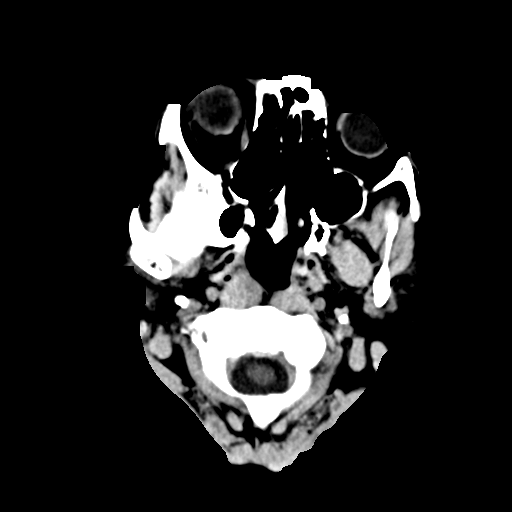
[im 2/34  bone]
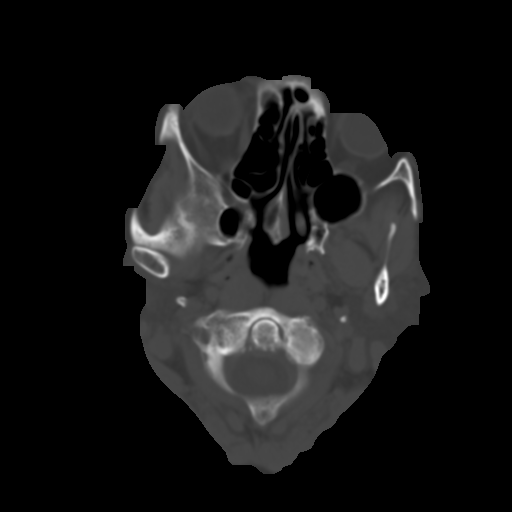
[im 4/34  brain]
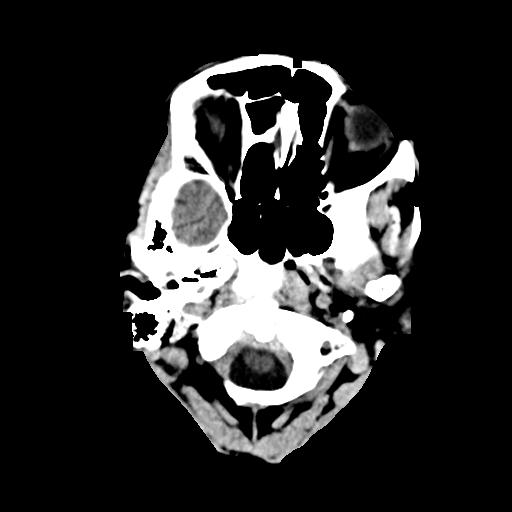
[im 6/34  brain]
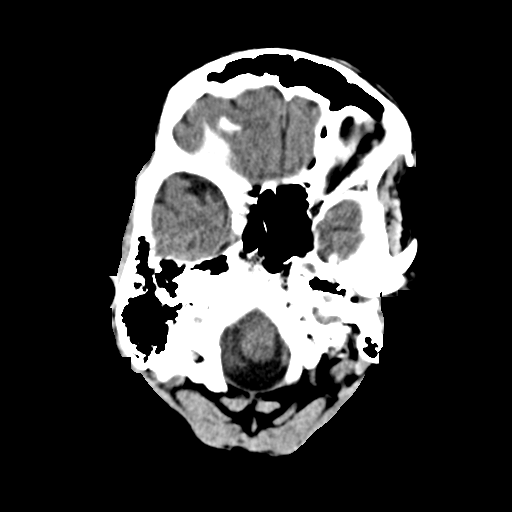
[im 8/34  brain]
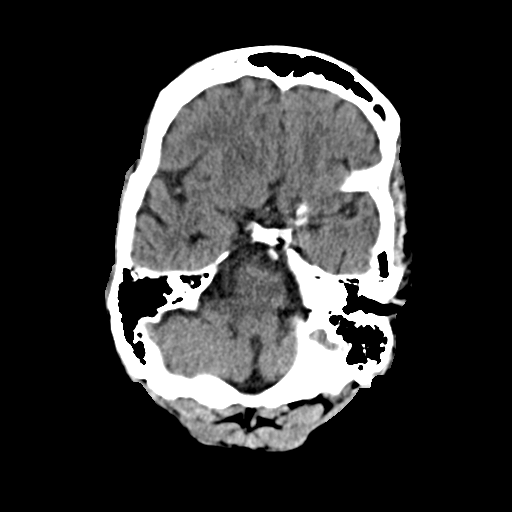
[im 11/34  brain]
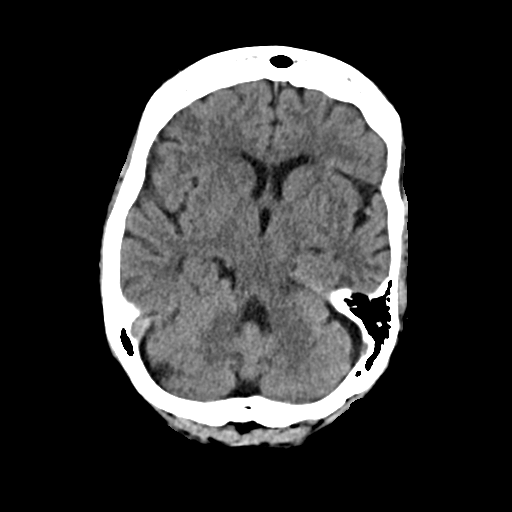
[im 11/34  bone]
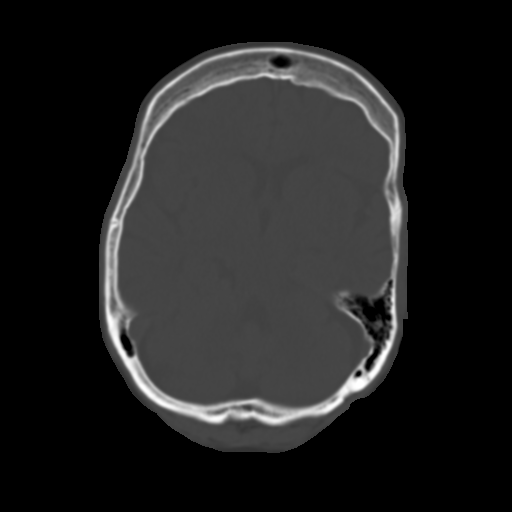
[im 13/34  brain]
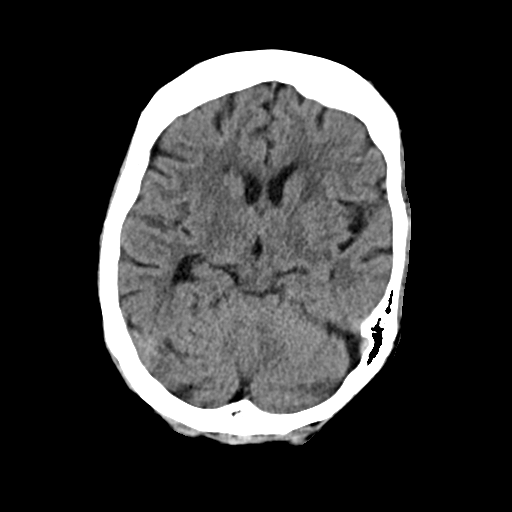
[im 15/34  brain]
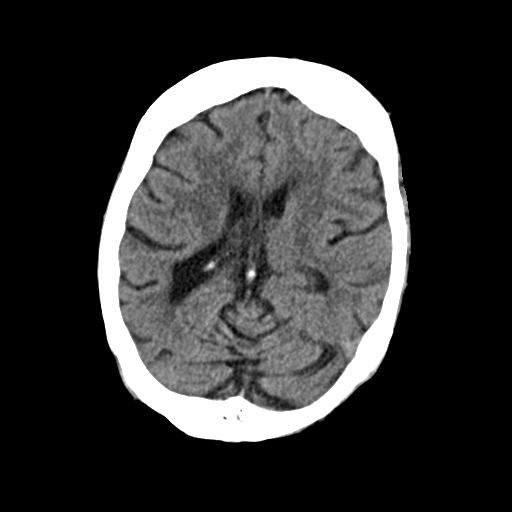
[im 18/34  brain]
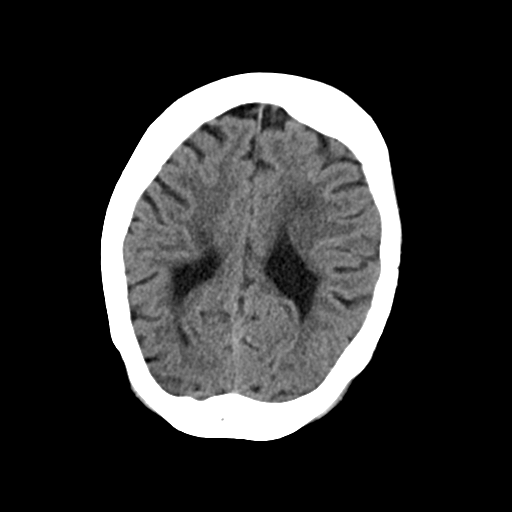
[im 19/34  brain]
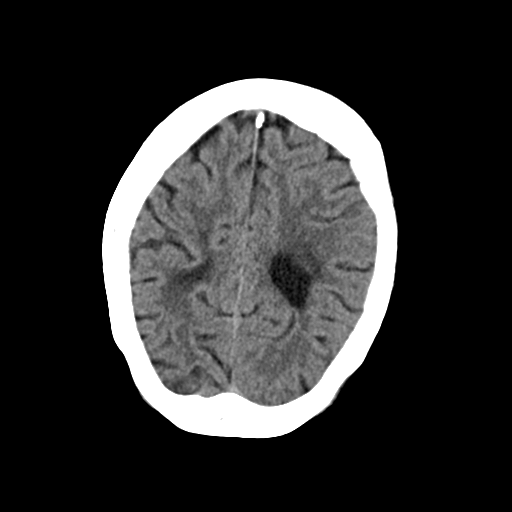
[im 19/34  bone]
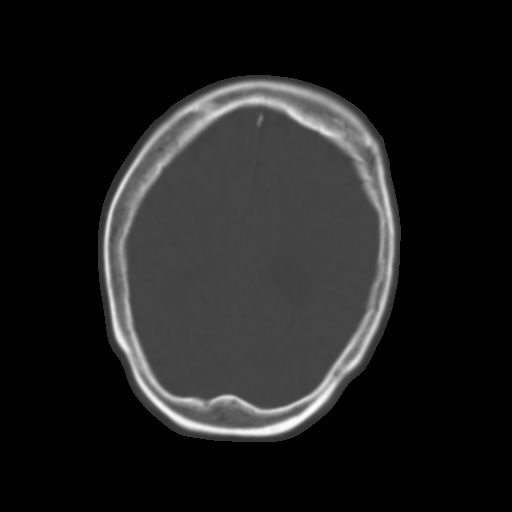
[im 21/34  brain]
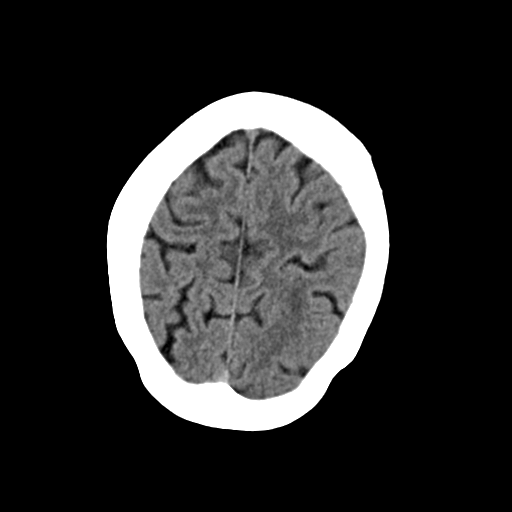
[im 23/34  brain]
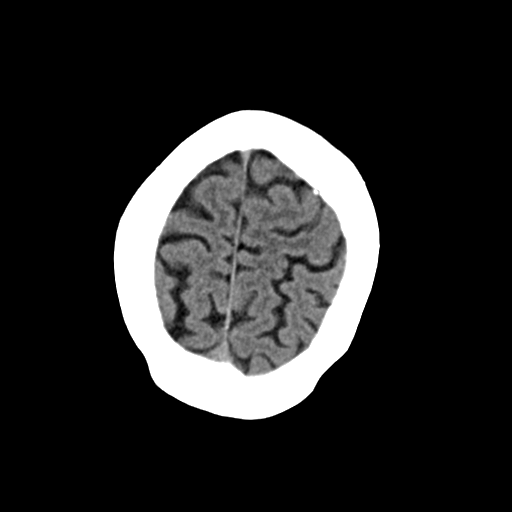
[im 26/34  brain]
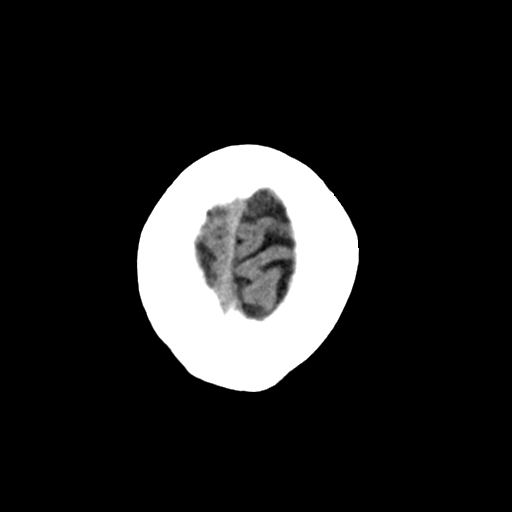
[im 28/34  brain]
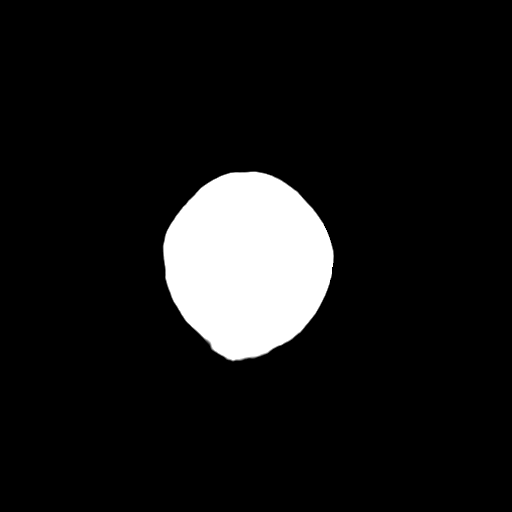
[im 28/34  bone]
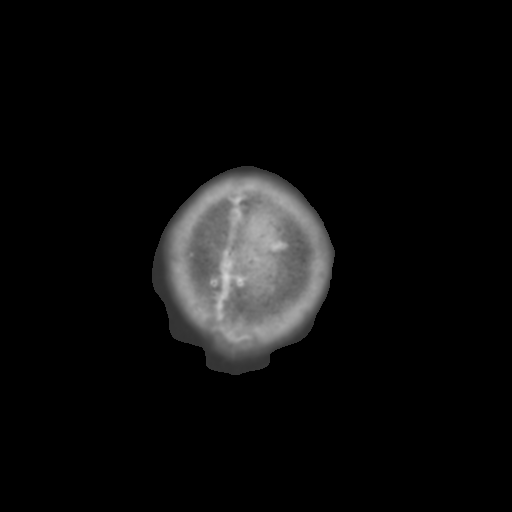
[im 30/34  brain]
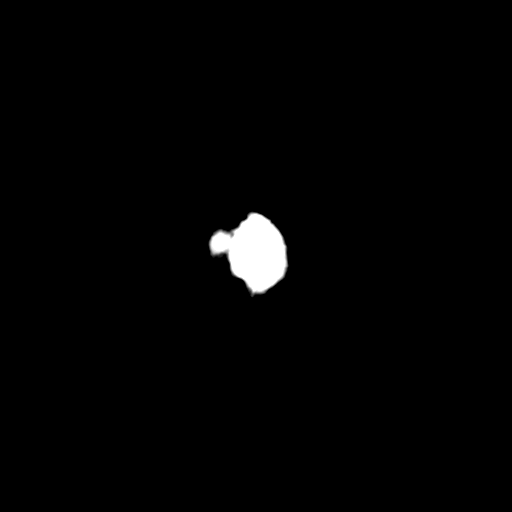
[im 32/34  brain]
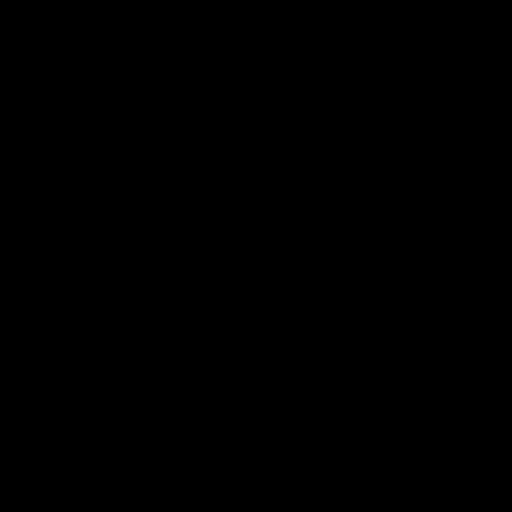

[15 of 30 positions shown; findings below may reference images not displayed]

FINDINGS: Brain: There is no evidence of an acute infarct, intracranial
hemorrhage, mass, midline shift, or extra-axial fluid collection.
Patchy hypodensities in the cerebral white matter bilaterally are
nonspecific but compatible with mild-to-moderate chronic small
vessel ischemic disease. Abnormal configuration of the atria of the
lateral ventricles with severe surrounding white matter volume loss
is unchanged and suggestive of periventricular leukomalacia from a
perinatal insult. There is a chronic lacunar infarct in the right
caudate head.

Vascular: Calcified atherosclerosis at the skull base. No hyperdense
vessel.

Skull: No fracture or suspicious osseous lesion.

Sinuses/Orbits: Visualized paranasal sinuses and mastoid air cells
are clear. Bilateral cataract extraction.

Other: None.
IMPRESSION: 1. No evidence of acute intracranial abnormality.
2. Mild-to-moderate chronic small vessel ischemic disease.

## 2021-12-21 DIAGNOSIS — E785 Hyperlipidemia, unspecified: Secondary | ICD-10-CM | POA: Diagnosis not present

## 2021-12-21 DIAGNOSIS — I10 Essential (primary) hypertension: Secondary | ICD-10-CM | POA: Diagnosis not present

## 2021-12-21 DIAGNOSIS — J4531 Mild persistent asthma with (acute) exacerbation: Secondary | ICD-10-CM | POA: Diagnosis not present

## 2022-02-05 DIAGNOSIS — M25512 Pain in left shoulder: Secondary | ICD-10-CM | POA: Diagnosis not present

## 2022-02-05 DIAGNOSIS — N1831 Chronic kidney disease, stage 3a: Secondary | ICD-10-CM | POA: Diagnosis not present

## 2022-02-05 DIAGNOSIS — I10 Essential (primary) hypertension: Secondary | ICD-10-CM | POA: Diagnosis not present

## 2022-02-05 DIAGNOSIS — Z0000 Encounter for general adult medical examination without abnormal findings: Secondary | ICD-10-CM | POA: Diagnosis not present

## 2022-02-05 DIAGNOSIS — Z23 Encounter for immunization: Secondary | ICD-10-CM | POA: Diagnosis not present

## 2022-02-05 DIAGNOSIS — D509 Iron deficiency anemia, unspecified: Secondary | ICD-10-CM | POA: Diagnosis not present

## 2022-02-05 DIAGNOSIS — M79672 Pain in left foot: Secondary | ICD-10-CM | POA: Diagnosis not present

## 2022-02-05 DIAGNOSIS — M810 Age-related osteoporosis without current pathological fracture: Secondary | ICD-10-CM | POA: Diagnosis not present

## 2022-02-05 DIAGNOSIS — Z1389 Encounter for screening for other disorder: Secondary | ICD-10-CM | POA: Diagnosis not present

## 2022-02-05 DIAGNOSIS — E782 Mixed hyperlipidemia: Secondary | ICD-10-CM | POA: Diagnosis not present

## 2022-02-05 DIAGNOSIS — M545 Low back pain, unspecified: Secondary | ICD-10-CM | POA: Diagnosis not present

## 2022-02-09 DIAGNOSIS — M79672 Pain in left foot: Secondary | ICD-10-CM | POA: Diagnosis not present

## 2022-02-09 DIAGNOSIS — R634 Abnormal weight loss: Secondary | ICD-10-CM | POA: Diagnosis not present

## 2022-02-09 DIAGNOSIS — M8580 Other specified disorders of bone density and structure, unspecified site: Secondary | ICD-10-CM | POA: Diagnosis not present

## 2022-02-09 DIAGNOSIS — M810 Age-related osteoporosis without current pathological fracture: Secondary | ICD-10-CM | POA: Diagnosis not present

## 2022-02-09 DIAGNOSIS — M545 Low back pain, unspecified: Secondary | ICD-10-CM | POA: Diagnosis not present

## 2022-02-09 DIAGNOSIS — D631 Anemia in chronic kidney disease: Secondary | ICD-10-CM | POA: Diagnosis not present

## 2022-02-09 DIAGNOSIS — Z9181 History of falling: Secondary | ICD-10-CM | POA: Diagnosis not present

## 2022-02-09 DIAGNOSIS — R519 Headache, unspecified: Secondary | ICD-10-CM | POA: Diagnosis not present

## 2022-02-09 DIAGNOSIS — K219 Gastro-esophageal reflux disease without esophagitis: Secondary | ICD-10-CM | POA: Diagnosis not present

## 2022-02-09 DIAGNOSIS — I129 Hypertensive chronic kidney disease with stage 1 through stage 4 chronic kidney disease, or unspecified chronic kidney disease: Secondary | ICD-10-CM | POA: Diagnosis not present

## 2022-02-09 DIAGNOSIS — M25552 Pain in left hip: Secondary | ICD-10-CM | POA: Diagnosis not present

## 2022-02-09 DIAGNOSIS — Z6823 Body mass index (BMI) 23.0-23.9, adult: Secondary | ICD-10-CM | POA: Diagnosis not present

## 2022-02-09 DIAGNOSIS — D509 Iron deficiency anemia, unspecified: Secondary | ICD-10-CM | POA: Diagnosis not present

## 2022-02-09 DIAGNOSIS — M25512 Pain in left shoulder: Secondary | ICD-10-CM | POA: Diagnosis not present

## 2022-02-09 DIAGNOSIS — N1831 Chronic kidney disease, stage 3a: Secondary | ICD-10-CM | POA: Diagnosis not present

## 2022-02-09 DIAGNOSIS — G14 Postpolio syndrome: Secondary | ICD-10-CM | POA: Diagnosis not present

## 2022-02-09 DIAGNOSIS — M1990 Unspecified osteoarthritis, unspecified site: Secondary | ICD-10-CM | POA: Diagnosis not present

## 2022-02-09 DIAGNOSIS — E782 Mixed hyperlipidemia: Secondary | ICD-10-CM | POA: Diagnosis not present

## 2022-02-16 DIAGNOSIS — I129 Hypertensive chronic kidney disease with stage 1 through stage 4 chronic kidney disease, or unspecified chronic kidney disease: Secondary | ICD-10-CM | POA: Diagnosis not present

## 2022-02-16 DIAGNOSIS — N1831 Chronic kidney disease, stage 3a: Secondary | ICD-10-CM | POA: Diagnosis not present

## 2022-02-16 DIAGNOSIS — M25512 Pain in left shoulder: Secondary | ICD-10-CM | POA: Diagnosis not present

## 2022-02-16 DIAGNOSIS — M25552 Pain in left hip: Secondary | ICD-10-CM | POA: Diagnosis not present

## 2022-02-16 DIAGNOSIS — M545 Low back pain, unspecified: Secondary | ICD-10-CM | POA: Diagnosis not present

## 2022-02-16 DIAGNOSIS — D631 Anemia in chronic kidney disease: Secondary | ICD-10-CM | POA: Diagnosis not present

## 2022-02-19 DIAGNOSIS — N1831 Chronic kidney disease, stage 3a: Secondary | ICD-10-CM | POA: Diagnosis not present

## 2022-02-19 DIAGNOSIS — D631 Anemia in chronic kidney disease: Secondary | ICD-10-CM | POA: Diagnosis not present

## 2022-02-19 DIAGNOSIS — M25512 Pain in left shoulder: Secondary | ICD-10-CM | POA: Diagnosis not present

## 2022-02-19 DIAGNOSIS — M25552 Pain in left hip: Secondary | ICD-10-CM | POA: Diagnosis not present

## 2022-02-19 DIAGNOSIS — I129 Hypertensive chronic kidney disease with stage 1 through stage 4 chronic kidney disease, or unspecified chronic kidney disease: Secondary | ICD-10-CM | POA: Diagnosis not present

## 2022-02-19 DIAGNOSIS — M545 Low back pain, unspecified: Secondary | ICD-10-CM | POA: Diagnosis not present

## 2022-02-24 DIAGNOSIS — M25552 Pain in left hip: Secondary | ICD-10-CM | POA: Diagnosis not present

## 2022-02-24 DIAGNOSIS — I129 Hypertensive chronic kidney disease with stage 1 through stage 4 chronic kidney disease, or unspecified chronic kidney disease: Secondary | ICD-10-CM | POA: Diagnosis not present

## 2022-02-24 DIAGNOSIS — D631 Anemia in chronic kidney disease: Secondary | ICD-10-CM | POA: Diagnosis not present

## 2022-02-24 DIAGNOSIS — M545 Low back pain, unspecified: Secondary | ICD-10-CM | POA: Diagnosis not present

## 2022-02-24 DIAGNOSIS — M25512 Pain in left shoulder: Secondary | ICD-10-CM | POA: Diagnosis not present

## 2022-02-24 DIAGNOSIS — N1831 Chronic kidney disease, stage 3a: Secondary | ICD-10-CM | POA: Diagnosis not present

## 2022-02-25 DIAGNOSIS — M545 Low back pain, unspecified: Secondary | ICD-10-CM | POA: Diagnosis not present

## 2022-02-25 DIAGNOSIS — M25552 Pain in left hip: Secondary | ICD-10-CM | POA: Diagnosis not present

## 2022-02-25 DIAGNOSIS — I129 Hypertensive chronic kidney disease with stage 1 through stage 4 chronic kidney disease, or unspecified chronic kidney disease: Secondary | ICD-10-CM | POA: Diagnosis not present

## 2022-02-25 DIAGNOSIS — D631 Anemia in chronic kidney disease: Secondary | ICD-10-CM | POA: Diagnosis not present

## 2022-02-25 DIAGNOSIS — M25512 Pain in left shoulder: Secondary | ICD-10-CM | POA: Diagnosis not present

## 2022-02-25 DIAGNOSIS — N1831 Chronic kidney disease, stage 3a: Secondary | ICD-10-CM | POA: Diagnosis not present

## 2022-02-26 DIAGNOSIS — N1831 Chronic kidney disease, stage 3a: Secondary | ICD-10-CM | POA: Diagnosis not present

## 2022-02-26 DIAGNOSIS — I10 Essential (primary) hypertension: Secondary | ICD-10-CM | POA: Diagnosis not present

## 2022-02-26 DIAGNOSIS — G14 Postpolio syndrome: Secondary | ICD-10-CM | POA: Diagnosis not present

## 2022-03-02 DIAGNOSIS — D631 Anemia in chronic kidney disease: Secondary | ICD-10-CM | POA: Diagnosis not present

## 2022-03-02 DIAGNOSIS — M25512 Pain in left shoulder: Secondary | ICD-10-CM | POA: Diagnosis not present

## 2022-03-02 DIAGNOSIS — I129 Hypertensive chronic kidney disease with stage 1 through stage 4 chronic kidney disease, or unspecified chronic kidney disease: Secondary | ICD-10-CM | POA: Diagnosis not present

## 2022-03-02 DIAGNOSIS — M25552 Pain in left hip: Secondary | ICD-10-CM | POA: Diagnosis not present

## 2022-03-02 DIAGNOSIS — M545 Low back pain, unspecified: Secondary | ICD-10-CM | POA: Diagnosis not present

## 2022-03-02 DIAGNOSIS — N1831 Chronic kidney disease, stage 3a: Secondary | ICD-10-CM | POA: Diagnosis not present

## 2022-03-09 DIAGNOSIS — M545 Low back pain, unspecified: Secondary | ICD-10-CM | POA: Diagnosis not present

## 2022-03-09 DIAGNOSIS — M25552 Pain in left hip: Secondary | ICD-10-CM | POA: Diagnosis not present

## 2022-03-09 DIAGNOSIS — I129 Hypertensive chronic kidney disease with stage 1 through stage 4 chronic kidney disease, or unspecified chronic kidney disease: Secondary | ICD-10-CM | POA: Diagnosis not present

## 2022-03-09 DIAGNOSIS — N1831 Chronic kidney disease, stage 3a: Secondary | ICD-10-CM | POA: Diagnosis not present

## 2022-03-09 DIAGNOSIS — M25512 Pain in left shoulder: Secondary | ICD-10-CM | POA: Diagnosis not present

## 2022-03-09 DIAGNOSIS — D631 Anemia in chronic kidney disease: Secondary | ICD-10-CM | POA: Diagnosis not present

## 2022-03-11 DIAGNOSIS — N1831 Chronic kidney disease, stage 3a: Secondary | ICD-10-CM | POA: Diagnosis not present

## 2022-03-11 DIAGNOSIS — D509 Iron deficiency anemia, unspecified: Secondary | ICD-10-CM | POA: Diagnosis not present

## 2022-03-11 DIAGNOSIS — Z9181 History of falling: Secondary | ICD-10-CM | POA: Diagnosis not present

## 2022-03-11 DIAGNOSIS — M25512 Pain in left shoulder: Secondary | ICD-10-CM | POA: Diagnosis not present

## 2022-03-11 DIAGNOSIS — Z6823 Body mass index (BMI) 23.0-23.9, adult: Secondary | ICD-10-CM | POA: Diagnosis not present

## 2022-03-11 DIAGNOSIS — K219 Gastro-esophageal reflux disease without esophagitis: Secondary | ICD-10-CM | POA: Diagnosis not present

## 2022-03-11 DIAGNOSIS — M25552 Pain in left hip: Secondary | ICD-10-CM | POA: Diagnosis not present

## 2022-03-11 DIAGNOSIS — M8580 Other specified disorders of bone density and structure, unspecified site: Secondary | ICD-10-CM | POA: Diagnosis not present

## 2022-03-11 DIAGNOSIS — M810 Age-related osteoporosis without current pathological fracture: Secondary | ICD-10-CM | POA: Diagnosis not present

## 2022-03-11 DIAGNOSIS — R634 Abnormal weight loss: Secondary | ICD-10-CM | POA: Diagnosis not present

## 2022-03-11 DIAGNOSIS — M545 Low back pain, unspecified: Secondary | ICD-10-CM | POA: Diagnosis not present

## 2022-03-11 DIAGNOSIS — M1990 Unspecified osteoarthritis, unspecified site: Secondary | ICD-10-CM | POA: Diagnosis not present

## 2022-03-11 DIAGNOSIS — M79672 Pain in left foot: Secondary | ICD-10-CM | POA: Diagnosis not present

## 2022-03-11 DIAGNOSIS — E782 Mixed hyperlipidemia: Secondary | ICD-10-CM | POA: Diagnosis not present

## 2022-03-11 DIAGNOSIS — G14 Postpolio syndrome: Secondary | ICD-10-CM | POA: Diagnosis not present

## 2022-03-11 DIAGNOSIS — D631 Anemia in chronic kidney disease: Secondary | ICD-10-CM | POA: Diagnosis not present

## 2022-03-11 DIAGNOSIS — I129 Hypertensive chronic kidney disease with stage 1 through stage 4 chronic kidney disease, or unspecified chronic kidney disease: Secondary | ICD-10-CM | POA: Diagnosis not present

## 2022-03-11 DIAGNOSIS — R519 Headache, unspecified: Secondary | ICD-10-CM | POA: Diagnosis not present

## 2022-03-17 DIAGNOSIS — M25552 Pain in left hip: Secondary | ICD-10-CM | POA: Diagnosis not present

## 2022-03-17 DIAGNOSIS — N1831 Chronic kidney disease, stage 3a: Secondary | ICD-10-CM | POA: Diagnosis not present

## 2022-03-17 DIAGNOSIS — D631 Anemia in chronic kidney disease: Secondary | ICD-10-CM | POA: Diagnosis not present

## 2022-03-17 DIAGNOSIS — M25512 Pain in left shoulder: Secondary | ICD-10-CM | POA: Diagnosis not present

## 2022-03-17 DIAGNOSIS — M545 Low back pain, unspecified: Secondary | ICD-10-CM | POA: Diagnosis not present

## 2022-03-17 DIAGNOSIS — I129 Hypertensive chronic kidney disease with stage 1 through stage 4 chronic kidney disease, or unspecified chronic kidney disease: Secondary | ICD-10-CM | POA: Diagnosis not present

## 2022-03-23 DIAGNOSIS — D631 Anemia in chronic kidney disease: Secondary | ICD-10-CM | POA: Diagnosis not present

## 2022-03-23 DIAGNOSIS — M25552 Pain in left hip: Secondary | ICD-10-CM | POA: Diagnosis not present

## 2022-03-23 DIAGNOSIS — M25512 Pain in left shoulder: Secondary | ICD-10-CM | POA: Diagnosis not present

## 2022-03-23 DIAGNOSIS — N1831 Chronic kidney disease, stage 3a: Secondary | ICD-10-CM | POA: Diagnosis not present

## 2022-03-23 DIAGNOSIS — I129 Hypertensive chronic kidney disease with stage 1 through stage 4 chronic kidney disease, or unspecified chronic kidney disease: Secondary | ICD-10-CM | POA: Diagnosis not present

## 2022-03-23 DIAGNOSIS — M545 Low back pain, unspecified: Secondary | ICD-10-CM | POA: Diagnosis not present

## 2022-03-30 DIAGNOSIS — D631 Anemia in chronic kidney disease: Secondary | ICD-10-CM | POA: Diagnosis not present

## 2022-03-30 DIAGNOSIS — M25552 Pain in left hip: Secondary | ICD-10-CM | POA: Diagnosis not present

## 2022-03-30 DIAGNOSIS — I129 Hypertensive chronic kidney disease with stage 1 through stage 4 chronic kidney disease, or unspecified chronic kidney disease: Secondary | ICD-10-CM | POA: Diagnosis not present

## 2022-03-30 DIAGNOSIS — M25512 Pain in left shoulder: Secondary | ICD-10-CM | POA: Diagnosis not present

## 2022-03-30 DIAGNOSIS — M545 Low back pain, unspecified: Secondary | ICD-10-CM | POA: Diagnosis not present

## 2022-03-30 DIAGNOSIS — N1831 Chronic kidney disease, stage 3a: Secondary | ICD-10-CM | POA: Diagnosis not present

## 2022-04-06 DIAGNOSIS — H401231 Low-tension glaucoma, bilateral, mild stage: Secondary | ICD-10-CM | POA: Diagnosis not present

## 2022-04-08 DIAGNOSIS — I129 Hypertensive chronic kidney disease with stage 1 through stage 4 chronic kidney disease, or unspecified chronic kidney disease: Secondary | ICD-10-CM | POA: Diagnosis not present

## 2022-04-08 DIAGNOSIS — D631 Anemia in chronic kidney disease: Secondary | ICD-10-CM | POA: Diagnosis not present

## 2022-04-08 DIAGNOSIS — M25552 Pain in left hip: Secondary | ICD-10-CM | POA: Diagnosis not present

## 2022-04-08 DIAGNOSIS — M25512 Pain in left shoulder: Secondary | ICD-10-CM | POA: Diagnosis not present

## 2022-04-08 DIAGNOSIS — M545 Low back pain, unspecified: Secondary | ICD-10-CM | POA: Diagnosis not present

## 2022-04-08 DIAGNOSIS — N1831 Chronic kidney disease, stage 3a: Secondary | ICD-10-CM | POA: Diagnosis not present

## 2022-04-10 DIAGNOSIS — M8580 Other specified disorders of bone density and structure, unspecified site: Secondary | ICD-10-CM | POA: Diagnosis not present

## 2022-04-10 DIAGNOSIS — D631 Anemia in chronic kidney disease: Secondary | ICD-10-CM | POA: Diagnosis not present

## 2022-04-10 DIAGNOSIS — M25512 Pain in left shoulder: Secondary | ICD-10-CM | POA: Diagnosis not present

## 2022-04-10 DIAGNOSIS — N1831 Chronic kidney disease, stage 3a: Secondary | ICD-10-CM | POA: Diagnosis not present

## 2022-04-10 DIAGNOSIS — R519 Headache, unspecified: Secondary | ICD-10-CM | POA: Diagnosis not present

## 2022-04-10 DIAGNOSIS — M25552 Pain in left hip: Secondary | ICD-10-CM | POA: Diagnosis not present

## 2022-04-10 DIAGNOSIS — M353 Polymyalgia rheumatica: Secondary | ICD-10-CM | POA: Diagnosis not present

## 2022-04-10 DIAGNOSIS — G14 Postpolio syndrome: Secondary | ICD-10-CM | POA: Diagnosis not present

## 2022-04-10 DIAGNOSIS — R634 Abnormal weight loss: Secondary | ICD-10-CM | POA: Diagnosis not present

## 2022-04-10 DIAGNOSIS — M25572 Pain in left ankle and joints of left foot: Secondary | ICD-10-CM | POA: Diagnosis not present

## 2022-04-10 DIAGNOSIS — E7800 Pure hypercholesterolemia, unspecified: Secondary | ICD-10-CM | POA: Diagnosis not present

## 2022-04-10 DIAGNOSIS — M545 Low back pain, unspecified: Secondary | ICD-10-CM | POA: Diagnosis not present

## 2022-04-10 DIAGNOSIS — K219 Gastro-esophageal reflux disease without esophagitis: Secondary | ICD-10-CM | POA: Diagnosis not present

## 2022-04-10 DIAGNOSIS — D509 Iron deficiency anemia, unspecified: Secondary | ICD-10-CM | POA: Diagnosis not present

## 2022-04-10 DIAGNOSIS — M810 Age-related osteoporosis without current pathological fracture: Secondary | ICD-10-CM | POA: Diagnosis not present

## 2022-04-10 DIAGNOSIS — M1990 Unspecified osteoarthritis, unspecified site: Secondary | ICD-10-CM | POA: Diagnosis not present

## 2022-04-10 DIAGNOSIS — Z9181 History of falling: Secondary | ICD-10-CM | POA: Diagnosis not present

## 2022-04-10 DIAGNOSIS — I129 Hypertensive chronic kidney disease with stage 1 through stage 4 chronic kidney disease, or unspecified chronic kidney disease: Secondary | ICD-10-CM | POA: Diagnosis not present

## 2022-04-10 DIAGNOSIS — Z6823 Body mass index (BMI) 23.0-23.9, adult: Secondary | ICD-10-CM | POA: Diagnosis not present

## 2022-04-13 DIAGNOSIS — Z23 Encounter for immunization: Secondary | ICD-10-CM | POA: Diagnosis not present

## 2022-04-15 DIAGNOSIS — D631 Anemia in chronic kidney disease: Secondary | ICD-10-CM | POA: Diagnosis not present

## 2022-04-15 DIAGNOSIS — M25512 Pain in left shoulder: Secondary | ICD-10-CM | POA: Diagnosis not present

## 2022-04-15 DIAGNOSIS — I129 Hypertensive chronic kidney disease with stage 1 through stage 4 chronic kidney disease, or unspecified chronic kidney disease: Secondary | ICD-10-CM | POA: Diagnosis not present

## 2022-04-15 DIAGNOSIS — M25552 Pain in left hip: Secondary | ICD-10-CM | POA: Diagnosis not present

## 2022-04-15 DIAGNOSIS — N1831 Chronic kidney disease, stage 3a: Secondary | ICD-10-CM | POA: Diagnosis not present

## 2022-04-15 DIAGNOSIS — M25572 Pain in left ankle and joints of left foot: Secondary | ICD-10-CM | POA: Diagnosis not present

## 2022-04-21 DIAGNOSIS — I129 Hypertensive chronic kidney disease with stage 1 through stage 4 chronic kidney disease, or unspecified chronic kidney disease: Secondary | ICD-10-CM | POA: Diagnosis not present

## 2022-04-21 DIAGNOSIS — D631 Anemia in chronic kidney disease: Secondary | ICD-10-CM | POA: Diagnosis not present

## 2022-04-21 DIAGNOSIS — M25552 Pain in left hip: Secondary | ICD-10-CM | POA: Diagnosis not present

## 2022-04-21 DIAGNOSIS — M25572 Pain in left ankle and joints of left foot: Secondary | ICD-10-CM | POA: Diagnosis not present

## 2022-04-21 DIAGNOSIS — M25512 Pain in left shoulder: Secondary | ICD-10-CM | POA: Diagnosis not present

## 2022-04-21 DIAGNOSIS — N1831 Chronic kidney disease, stage 3a: Secondary | ICD-10-CM | POA: Diagnosis not present

## 2022-04-29 DIAGNOSIS — M25512 Pain in left shoulder: Secondary | ICD-10-CM | POA: Diagnosis not present

## 2022-04-29 DIAGNOSIS — M25552 Pain in left hip: Secondary | ICD-10-CM | POA: Diagnosis not present

## 2022-04-29 DIAGNOSIS — I129 Hypertensive chronic kidney disease with stage 1 through stage 4 chronic kidney disease, or unspecified chronic kidney disease: Secondary | ICD-10-CM | POA: Diagnosis not present

## 2022-04-29 DIAGNOSIS — M25572 Pain in left ankle and joints of left foot: Secondary | ICD-10-CM | POA: Diagnosis not present

## 2022-04-29 DIAGNOSIS — N1831 Chronic kidney disease, stage 3a: Secondary | ICD-10-CM | POA: Diagnosis not present

## 2022-04-29 DIAGNOSIS — D631 Anemia in chronic kidney disease: Secondary | ICD-10-CM | POA: Diagnosis not present

## 2022-05-04 DIAGNOSIS — M25512 Pain in left shoulder: Secondary | ICD-10-CM | POA: Diagnosis not present

## 2022-05-04 DIAGNOSIS — D631 Anemia in chronic kidney disease: Secondary | ICD-10-CM | POA: Diagnosis not present

## 2022-05-04 DIAGNOSIS — M25572 Pain in left ankle and joints of left foot: Secondary | ICD-10-CM | POA: Diagnosis not present

## 2022-05-04 DIAGNOSIS — I129 Hypertensive chronic kidney disease with stage 1 through stage 4 chronic kidney disease, or unspecified chronic kidney disease: Secondary | ICD-10-CM | POA: Diagnosis not present

## 2022-05-04 DIAGNOSIS — N1831 Chronic kidney disease, stage 3a: Secondary | ICD-10-CM | POA: Diagnosis not present

## 2022-05-04 DIAGNOSIS — M25552 Pain in left hip: Secondary | ICD-10-CM | POA: Diagnosis not present

## 2022-05-10 DIAGNOSIS — M25552 Pain in left hip: Secondary | ICD-10-CM | POA: Diagnosis not present

## 2022-05-10 DIAGNOSIS — M8580 Other specified disorders of bone density and structure, unspecified site: Secondary | ICD-10-CM | POA: Diagnosis not present

## 2022-05-10 DIAGNOSIS — I129 Hypertensive chronic kidney disease with stage 1 through stage 4 chronic kidney disease, or unspecified chronic kidney disease: Secondary | ICD-10-CM | POA: Diagnosis not present

## 2022-05-10 DIAGNOSIS — Z9181 History of falling: Secondary | ICD-10-CM | POA: Diagnosis not present

## 2022-05-10 DIAGNOSIS — K219 Gastro-esophageal reflux disease without esophagitis: Secondary | ICD-10-CM | POA: Diagnosis not present

## 2022-05-10 DIAGNOSIS — N1831 Chronic kidney disease, stage 3a: Secondary | ICD-10-CM | POA: Diagnosis not present

## 2022-05-10 DIAGNOSIS — M545 Low back pain, unspecified: Secondary | ICD-10-CM | POA: Diagnosis not present

## 2022-05-10 DIAGNOSIS — Z6823 Body mass index (BMI) 23.0-23.9, adult: Secondary | ICD-10-CM | POA: Diagnosis not present

## 2022-05-10 DIAGNOSIS — G14 Postpolio syndrome: Secondary | ICD-10-CM | POA: Diagnosis not present

## 2022-05-10 DIAGNOSIS — D509 Iron deficiency anemia, unspecified: Secondary | ICD-10-CM | POA: Diagnosis not present

## 2022-05-10 DIAGNOSIS — M25572 Pain in left ankle and joints of left foot: Secondary | ICD-10-CM | POA: Diagnosis not present

## 2022-05-10 DIAGNOSIS — R634 Abnormal weight loss: Secondary | ICD-10-CM | POA: Diagnosis not present

## 2022-05-10 DIAGNOSIS — M810 Age-related osteoporosis without current pathological fracture: Secondary | ICD-10-CM | POA: Diagnosis not present

## 2022-05-10 DIAGNOSIS — M25512 Pain in left shoulder: Secondary | ICD-10-CM | POA: Diagnosis not present

## 2022-05-10 DIAGNOSIS — E7800 Pure hypercholesterolemia, unspecified: Secondary | ICD-10-CM | POA: Diagnosis not present

## 2022-05-10 DIAGNOSIS — M353 Polymyalgia rheumatica: Secondary | ICD-10-CM | POA: Diagnosis not present

## 2022-05-10 DIAGNOSIS — M1990 Unspecified osteoarthritis, unspecified site: Secondary | ICD-10-CM | POA: Diagnosis not present

## 2022-05-10 DIAGNOSIS — R519 Headache, unspecified: Secondary | ICD-10-CM | POA: Diagnosis not present

## 2022-05-10 DIAGNOSIS — D631 Anemia in chronic kidney disease: Secondary | ICD-10-CM | POA: Diagnosis not present

## 2022-05-11 DIAGNOSIS — D631 Anemia in chronic kidney disease: Secondary | ICD-10-CM | POA: Diagnosis not present

## 2022-05-11 DIAGNOSIS — M25512 Pain in left shoulder: Secondary | ICD-10-CM | POA: Diagnosis not present

## 2022-05-11 DIAGNOSIS — M25572 Pain in left ankle and joints of left foot: Secondary | ICD-10-CM | POA: Diagnosis not present

## 2022-05-11 DIAGNOSIS — I129 Hypertensive chronic kidney disease with stage 1 through stage 4 chronic kidney disease, or unspecified chronic kidney disease: Secondary | ICD-10-CM | POA: Diagnosis not present

## 2022-05-11 DIAGNOSIS — N1831 Chronic kidney disease, stage 3a: Secondary | ICD-10-CM | POA: Diagnosis not present

## 2022-05-11 DIAGNOSIS — M25552 Pain in left hip: Secondary | ICD-10-CM | POA: Diagnosis not present

## 2022-09-16 ENCOUNTER — Telehealth: Payer: Self-pay | Admitting: Neurology

## 2022-09-16 NOTE — Telephone Encounter (Signed)
I'm going to need an hour with this patient if anything comes up sooner change her ty

## 2022-12-01 ENCOUNTER — Ambulatory Visit (INDEPENDENT_AMBULATORY_CARE_PROVIDER_SITE_OTHER): Payer: Medicare Other | Admitting: Neurology

## 2022-12-01 ENCOUNTER — Encounter: Payer: Self-pay | Admitting: Neurology

## 2022-12-01 VITALS — BP 122/84 | HR 86 | Ht 64.0 in | Wt 128.8 lb

## 2022-12-01 DIAGNOSIS — G309 Alzheimer's disease, unspecified: Secondary | ICD-10-CM

## 2022-12-01 DIAGNOSIS — F03A2 Unspecified dementia, mild, with psychotic disturbance: Secondary | ICD-10-CM

## 2022-12-01 DIAGNOSIS — F22 Delusional disorders: Secondary | ICD-10-CM

## 2022-12-01 DIAGNOSIS — T7840XA Allergy, unspecified, initial encounter: Secondary | ICD-10-CM

## 2022-12-01 DIAGNOSIS — G5603 Carpal tunnel syndrome, bilateral upper limbs: Secondary | ICD-10-CM

## 2022-12-01 DIAGNOSIS — Z7289 Other problems related to lifestyle: Secondary | ICD-10-CM

## 2022-12-01 DIAGNOSIS — R7309 Other abnormal glucose: Secondary | ICD-10-CM

## 2022-12-01 DIAGNOSIS — R410 Disorientation, unspecified: Secondary | ICD-10-CM

## 2022-12-01 DIAGNOSIS — Z114 Encounter for screening for human immunodeficiency virus [HIV]: Secondary | ICD-10-CM | POA: Diagnosis not present

## 2022-12-01 DIAGNOSIS — I672 Cerebral atherosclerosis: Secondary | ICD-10-CM

## 2022-12-01 MED ORDER — SERTRALINE HCL 100 MG PO TABS: 100.0000 mg | ORAL_TABLET | Freq: Every evening | ORAL | 4 refills | Status: DC

## 2022-12-01 MED ORDER — DONEPEZIL HCL 5 MG PO TABS: 5.0000 mg | ORAL_TABLET | Freq: Every day | ORAL | 0 refills | Status: DC

## 2022-12-01 NOTE — Patient Instructions (Addendum)
Increase zoloft to 100mg  daily for mood at night In about a week if no side effects start Aricept/Donepezil 5mg  for memory and then we will increase to 10mg  if she does well on it in 2 weeks - mychart me. After that consider memantine/namenda Bloodwork and urine MRI brain w/wo contrast In the bloodwork I am going genetic test for alzheimers gene and bad alzheimers protein in the blood then we will order PET Amyloid in the brain to confirm alzheimer's disease then We can consider lecanemab  Meds ordered this encounter  Medications   DISCONTD: sertraline (ZOLOFT) 100 MG tablet    Sig: Take 1 tablet (100 mg total) by mouth at bedtime. 2 tablets every night.    Dispense:  90 tablet    Refill:  4   donepezil (ARICEPT) 5 MG tablet    Sig: Take 1 tablet (5 mg total) by mouth at bedtime.    Dispense:  30 tablet    Refill:  0   sertraline (ZOLOFT) 100 MG tablet    Sig: Take 1 tablet (100 mg total) by mouth at bedtime.    Dispense:  90 tablet    Refill:  4    Please cancel all other sertraline prescriptions   Orders Placed This Encounter  Procedures   Culture, Urine   MR BRAIN W WO CONTRAST   CBC with Differential/Platelets   Comprehensive metabolic panel   TSH Rfx on Abnormal to Free T4   B12 and Folate Panel   Methylmalonic acid, serum   Vitamin B1   RPR   HIV Antibody (routine testing w rflx)   Homocysteine   Ammonia   Urinalysis, Routine w reflex microscopic   APOE Alzheimer's Risk   ATN PROFILE   Lipid Panel   Hemoglobin A1c   NCV with EMG(electromyography)      Alzheimer's Disease Alzheimer's disease is a brain disease that affects memory, thinking, language, and behavior. People with Alzheimer's disease lose mental abilities, and the disease gets worse over time. Alzheimer's disease is a form of dementia. What are the causes? This condition develops when a protein called beta-amyloid forms deposits in the brain. It is not known what causes these deposits to  form. Alzheimer's disease may also be caused by a gene mutation that is inherited from one parent or both parents. A gene mutation is a harmful change in a gene. Not everyone who inherits the genetic mutation will get the disease. What increases the risk? You are more likely to develop this condition if you: Are older than age 44. Are female. Have any of these medical conditions: High blood pressure. Diabetes. Heart or blood vessel disease. Smoke. Have obesity. Have had a brain injury. Have had a stroke. Have a family history of dementia. What are the signs or symptoms? Symptoms of this condition may happen in three stages, which often overlap. Early stage In this stage, you may continue to be independent. You may still be able to drive, work, and be social. Symptoms in this stage include: Minor memory problems, such as forgetting a name, words, or what you did recently. Difficulty with: Paying attention. Communicating. Doing familiar tasks. Problem solving or doing calculations. Following instructions. Learning new things. Anxiety. Social withdrawal. Loss of motivation. Moderate stage In this stage, you will start to need care. Symptoms in this stage include: Difficulty with expressing thoughts. Memory loss that affects daily life. This can include forgetting: Recent events that have happened. If you have taken medicines or eaten. Familiar places.  You may get lost while walking or driving. To pay bills or manage finances. Personal hygiene such as bathing or using the bathroom. Confusion about where you are or what time it is. Difficulty in judging distance. Changes in personality, mood, and behavior. You may be moody, irritable, angry, frustrated, fearful, anxious, or suspicious. Poor reasoning and judgment. Delusions or hallucinations. Changes in sleep patterns. Severe stage In the final stage, you will need help with your personal care and daily activities. Symptoms  in this stage include: Worsening memory loss. Personality changes. Loss of awareness of your surroundings. Changes in physical abilities, including the ability to walk, sit, and swallow. Difficulty in communicating. Inability to control your bladder and bowels. Increasing confusion. Increasing behavior changes. How is this diagnosed?  This condition is diagnosed by a health care provider who specializes in diseases of the nervous system (neurologist) or one who specializes in care of the elderly (geriatrician or geriatric psychiatrist). Other causes of dementia may also be ruled out. Your health care provider will talk with you and your family, friends, or caregivers about your history and symptoms. A thorough medical history will be taken, and you will have a physical exam and tests. Tests may include: Lab tests, such as blood or urine tests. Imaging tests, such as a CT scan, a PET scan, or an MRI. A lumbar puncture. This test involves removing and testing a small amount of the fluid that surrounds the brain and spinal cord. An electroencephalogram (EEG). In this test, small metal discs are used to measure electrical activity in the brain. Memory tests, cognitive tests, and neuropsychological tests. These tests evaluate brain function. Genetic testing. This may be done if you have early onset of the disease (before age 42) or if other family members have the disease. How is this treated? At this time, there is no treatment to cure Alzheimer's disease or stop it from getting worse. The goals of treatment are: To manage behavioral changes. To provide you with a safe environment. To help manage daily life for you and your caregivers. The following treatment options are available: Medicines. Medicines may help the memory work better and manage behavioral symptoms. Cognitive therapy. Cognitive therapy provides you with education, support, and memory aids. It is most helpful in the early stages  of the condition. Counseling or spiritual guidance. It is normal to have a lot of feelings, including anger, relief, fear, and isolation. Counseling and guidance can help you deal with these feelings. Caregiving. This involves having caregivers help you with your daily activities. Family support groups. These provide education, emotional support, and information about community resources to family members who are taking care of you. Follow these instructions at home:  Medicines Take over-the-counter and prescription medicines only as told by your health care provider. Use a pill organizer or pill reminder to help you manage your medicines. Avoid taking medicines that can affect thinking, such as pain medicines or sleeping medicines. Lifestyle Make healthy lifestyle choices: Be physically active as told by your health care provider. Regular exercise may help improve symptoms. Do not use any products that contain nicotine or tobacco, such as cigarettes, e-cigarettes, and chewing tobacco. If you need help quitting, ask your health care provider. Do not drink alcohol. Eat a healthy diet. Practice stress-management techniques when you get stressed. Stay social. Drink enough fluid to keep your urine pale yellow. Make sure to get quality sleep. Avoid taking long naps during the day. Take short naps of 30 minutes or less  if needed. Keep your sleeping area dark and cool. Avoid exercising during the few hours before you go to bed. Avoid caffeine products in the afternoon and evening. General instructions Work with your health care provider to determine what you need help with and what your safety needs are. If you were given a bracelet that identifies you as a person with memory loss or tracks your location, make sure to wear it at all times. Talk with your health care provider about whether it is safe for you to drive. Work with your family to make important decisions, such as advance directives,  medical power of attorney, or a living will. Keep all follow-up visits. This is important. Where to find more information The Alzheimer's [employer]: Call the 24-hour helpline at 787-252-2492, or visit [employer] Contact a health care provider if: You have nausea, vomiting, or trouble with eating related to a medicine. You have worsening mood or behavior changes, such as depression, anxiety, or hallucinations. You or your family members become concerned for your safety. Get help right away if: You become less responsive or are difficult to wake up. Your memory suddenly gets worse. You feel that you want to harm yourself. If you ever feel like you may hurt yourself or others, or have thoughts about taking your own life, get help right away. Go to your nearest emergency department or: Call your local emergency services (911 in the [place] Call a suicide crisis helpline, such as the National Suicide Prevention Lifeline at 440 260 8616 or 988 in the [place] This is open 24 hours a day in the [place] Text the Crisis Text Line at (216)502-7688 (in the [place] Summary Alzheimer's disease is a brain disease that affects memory, thinking, language, and behavior. Alzheimer's disease is a form of dementia. This condition is diagnosed by a specialist in diseases of the nervous system (neurologist) or one who specializes in care of the elderly. At this time, there is no treatment to cure Alzheimer's disease or stop it from getting worse. The goal of treatment is to help you manage any symptoms. Work with your family to make important decisions, such as advance directives, medical power of attorney, or a living will. This information is not intended to replace advice given to you by your health care provider. Make sure you discuss any questions you have with your health care provider.  Vascular Dementia Dementia is a condition in which a person has problems with thinking, memory, and behavior that are severe enough  to interfere with daily life. Vascular dementia is a type of dementia. It results from brain damage that is caused by the brain not getting enough blood. This condition may also be called vascular cognitive impairment. What are the causes? Vascular dementia is caused by conditions that reduce blood flow to the brain. Common causes of this condition include: Multiple small strokes. These may happen without symptoms (silent stroke). Major stroke. Damage to small blood vessels in the brain (cerebral small vessel disease). What increases the risk? The following factors may make you more likely to develop this condition: Having had a stroke. Having high blood pressure (hypertension) or high cholesterol. Having a disease that affects the heart or blood vessels. Smoking. Not being active. Being over age 34. Having any of these conditions: Diabetes. Metabolic syndrome. Obesity. Depression. A genetic condition that leads to stroke, such as CADASIL (cerebral autosomal dominant arteriopathy with subcortical infarcts and leukoencephalopathy). What are the signs or symptoms? Symptoms of vascular dementia can vary from one person  to another. Symptoms may be mild or severe depending on the amount of damage and which parts of the brain have been affected. Symptoms may begin suddenly or may develop slowly. Mental symptoms may include: Confusion and memory problems. Poor attention and concentration. Trouble understanding speech. Depression. Personality changes. Trouble recognizing familiar people. Agitation or aggression. Paranoia or false beliefs (delusions). Hallucinations. These involve seeing, hearing, tasting, smelling, or feeling things that are not real. Physical symptoms may include: Weakness. Poor balance. Loss of bladder or bowel control (incontinence). Unsteady walking (gait). Speaking problems. Behavioral symptoms may include: Getting lost in familiar places. Problems with planning  and judgment. Trouble following instructions. Social problems. Emotional outbursts. Trouble with daily activities and self-care. Problems handling money. Symptoms may remain stable, or they may get worse over time. Symptoms of vascular dementia may be similar to those of Alzheimer's disease. The two conditions can occur together (mixed dementia). How is this diagnosed? This condition may be diagnosed based on: Your medical history and a physical exam. Symptoms or changes that are reported by friends and family. Lab tests or other tests that check brain and nervous system function. Tests may include: Blood tests. Brain imaging tests. Tests of movement, speech, and other daily activities (neurological exam). Tests of memory, thinking, and problem-solving (neuropsychological or neurocognitive testing). There is not a specific test to diagnose vascular dementia. Diagnosis may involve several specialists. These may include: A health care provider who specializes in the brain and nervous system (neurologist). A health care provider who specializes in understanding how problems in the brain can alter behavior and cognitive function (neuropsychologist). How is this treated? There is no cure for vascular dementia. Brain damage that has already occurred cannot be reversed. Treatment depends on: How severe the condition is. Which parts of your brain have been affected. Your overall health. Treatment measures aim to: Treat the underlying cause of vascular dementia and manage risk factors. This may include: Controlling blood pressure or lowering cholesterol. Treating diabetes. Making lifestyle changes, such as quitting smoking or losing weight. Manage symptoms. Prevent further brain damage. Improve the person's health and quality of life. Treatment for dementia may involve a team of health care providers, including: A neurologist. A provider who specializes in disorders of the mind  (psychiatrist). A provider who specializes in helping people learn daily living skills (occupational therapist). A provider who focuses on speech and language changes (Doctor, general practice). A heart specialist (cardiologist). A provider who helps people learn how to manage physical changes, such as movement and walking (exercise physiologist or physical therapist). Follow these instructions at home: Medicines Take over-the-counter and prescription medicines only as told by your health care provider. Use a pill organizer or pill reminder to help you manage your medicines. Lifestyle Do not use any products that contain nicotine or tobacco, such as cigarettes, e-cigarettes, and chewing tobacco. If you need help quitting, ask your health care provider. Eat a healthy, balanced diet. Maintain a healthy weight, or lose weight if needed. Be physically active as told by your health care provider. General instructions Work with your health care provider to determine what you need help with and what your safety needs are. Follow the health care provider's instructions for treating the condition that caused the dementia. Keep all follow-up visits. This is important. If you are the caregiver:  People with vascular dementia may need regular help at home or daily care from a family member or home health care worker. Home care for a person with vascular dementia  depends on what caused the condition and how severe the symptoms are. General guidelines for caregivers include: Help the person with dementia remember people, appointments, and daily activities. Help the person with dementia manage his or her medicines. Help family and friends learn about ways to communicate with the person with dementia. Create a safe living space to reduce the risk of injury or falls. Find a support group to help caregivers and family cope with the effects of dementia.  Where to find more information [employer] of  Neurological Disorders and Stroke: [employer] Contact a health care provider if: New behavioral problems develop. Problems with swallowing develop. Confusion gets worse. Sleepiness gets worse. Get help right away if: Loss of consciousness occurs. There is a sudden loss of speech, balance, or thinking ability. New numbness or paralysis occurs. Sudden, severe headache occurs. Vision is lost or suddenly gets worse in one or both eyes. If you ever feel like the person with dementia may hurt himself or herself or others, or if he or she shares thoughts about taking his or her own life, get help right away. You can go to your nearest emergency department or: Call your local emergency services (911 in the [place] Call a suicide crisis helpline, such as the National Suicide Prevention Lifeline at 575-106-3095 or 988 in the [place] This is open 24 hours a day in the [place] Text the Crisis Text Line at 726-628-3927 (in the [place] Summary Vascular dementia is a type of dementia. It results from brain damage that is caused by the brain not getting enough blood. Vascular dementia is caused by conditions that reduce blood flow to the brain. Common causes of this condition include stroke and damage to small blood vessels in the brain. Treatment focuses on treating the underlying cause of vascular dementia and managing any risk factors. People with vascular dementia may need regular help at home or daily care from a family member or home health care worker. Contact a health care provider if you or your caregiver notices any new symptoms. This information is not intended to replace advice given to you by your health care provider. Make sure you discuss any questions you have with your health care provider. Document Revised: 12/10/2020 Document Reviewed: 10/01/2019 Elsevier Patient Education  2024 [employer]  Document Revised: 12/10/2020 Document Reviewed: 09/03/2019 Elsevier Patient Education  2024  [employer].

## 2022-12-01 NOTE — Progress Notes (Signed)
[facility]    Provider:  Dr Lucia Gaskins Requesting Provider: Jackelyn Poling, DO Primary Care Provider:  Ileana Ladd, MD (Inactive)  CC:  Dementia  HPI: 12/01/2022:  Jacqueline Foley is a 89 y.o. female here as requested by Jackelyn Poling, DO for memory. has Precordial chest pain; Chest pain at rest, negative MI, negative stress test, may have been GI; Essential hypertension; DOE (dyspnea on exertion), may be due to HTN; Lumbar radiculopathy; Chest pain; History of chest pain; and Aortic atherosclerosis (HCC) on their problem list.  She also has hypertension, left leg pain, gait disturbance, hyperlipidemia, Alzheimer's disease, anxiety, insomnia, postpolio syndrome, dysphagia, osteoporosis, asthma, chronic kidney disease arthritis and pain and headaches on her problem list when reviewing primary care notes.  Saw patient back in 2019 for similar symptoms.  She does have a history of dementia with psychiatric disturbance.  She has been on sertraline for a long time but low-dose.  I reviewed my notes from 2019, when we saw her she was already having paranoid delusions that someone is breaking into her apartment and moving things, her daughter had placed cameras in and around the house and did not see anybody.  She is here again with her daughter.  She has been having memory problems for many years.  It is more short-term memory and progressive.  She lives in senior living situation.  She has not been driving for years.  Ongoing and progressive since at least 2017.  She does have a history of dementia in her maternal aunt.  Remote memory is still okay but progressive short-term memory and behavioral symptoms.  Daughter provides most info. No falls.   Patient complains of symptoms per HPI as well as the following symptoms: Dementia. Pertinent negatives and positives per HPI. All others negative     HPI 08/04/2017:  Jacqueline Foley is a 89 y.o. female here as requested by Jackelyn Poling, DO  for memory. has Precordial chest pain; Chest pain at rest, negative MI, negative stress test, may have been GI; Essential hypertension; DOE (dyspnea on exertion), may be due to HTN; Lumbar radiculopathy; Chest pain; History of chest pain; and Aortic atherosclerosis (HCC) on their problem list.  I reviewed primary care notes, problem list includes hypertension, left leg pain, gait disturbance, hyperlipidemia, lumbar disc, Alzheimer's disease, left leg weakness, anxiety, insomnia, PMR, GERD, postpolio syndrome, dysphagia, osteoporosis, mild persistent asthma with exacerbation, stage IIIa chronic kidney disease, arthritis and pain, headaches.  She was sent here today for dementia with psychiatric disturbance, they started sertraline at last appointment, having paranoid delusions that someone was breaking into her apartment and moving things, her daughter placed cameras in the apartment and no one was entering but the patient, explained she thinks the tremors are defective and not seeing the person or the person is able to sneak around without the camera picking them up.  This has not changed since starting the sertraline.  Note states she had a 14 out of 30 on the [facility] cognitive assessment test.  She had a CT in the middle of 2022 for intermittent confusion which did not show any acute abnormalities.  No focal deficits on physical exam.  We have seen her in the past for other chief complaints but not for over 5 years.  Last labs taken were 02/17/2022 and her CMP appeared unremarkable with BUN 21 and creatinine 1.08, which showed a slight decrease in GFR of 50, her CBC was unremarkable,  She is here with daugher who provides  much information. Patient says she is having memory problems. Patient says it is "not everything but some things". Daughter says she is having delusions, hallucinations, people are stealing from her, short-term memory loss, she lives in the [place]ilon, independent living and senior "dorm", she  doesn't like to [employer] or go out, she has had some wieght loss, family checks up all the time her daughter and husband, repeating same things over and over again, she does sometimes remember appointments, she loses things, no driving for years, she does not wander at night but she is up a lot wandering she naps a lot. More isolation and not wanting to leave apartment. Started a year ago or two and progressively worsening. Maternal aunt had dementia. Remote memory is good.  No inciting event such as no head trauma or new medications when symptoms started and progressed.  No parkinsonian movement disorders present.  No other associated symptoms or focal neurologic deficits.  Reviewed notes, labs and imaging from outside physicians, which showed:   CT Head 12/2020: IMPRESSION: 1. No evidence of acute intracranial abnormality. 2. Mild-to-moderate chronic small vessel ischemic disease.  Recent Results (from the past 2160 hour(s))  CBC with Differential/Platelets     Status: None   Collection Time: 12/01/22  9:48 AM  Result Value Ref Range   WBC 4.7 3.4 - 10.8 x10E3/uL   RBC 4.47 3.77 - 5.28 x10E6/uL   Hemoglobin 13.1 11.1 - 15.9 g/dL   Hematocrit 16.1 09.6 - 46.6 %   MCV 90 79 - 97 fL   MCH 29.3 26.6 - 33.0 pg   MCHC 32.4 31.5 - 35.7 g/dL   RDW 04.5 40.9 - 81.1 %   Platelets 282 150 - 450 x10E3/uL   Neutrophils 61 Not Estab. %   Lymphs 28 Not Estab. %   Monocytes 7 Not Estab. %   Eos 3 Not Estab. %   Basos 1 Not Estab. %   Neutrophils Absolute 2.9 1.4 - 7.0 x10E3/uL   Lymphocytes Absolute 1.3 0.7 - 3.1 x10E3/uL   Monocytes Absolute 0.3 0.1 - 0.9 x10E3/uL   EOS (ABSOLUTE) 0.1 0.0 - 0.4 x10E3/uL   Basophils Absolute 0.1 0.0 - 0.2 x10E3/uL   Immature Granulocytes 0 Not Estab. %   Immature Grans (Abs) 0.0 0.0 - 0.1 x10E3/uL  Comprehensive metabolic panel     Status: Abnormal   Collection Time: 12/01/22  9:48 AM  Result Value Ref Range   Glucose 87 70 - 99 mg/dL   BUN 16 8 - 27 mg/dL    Creatinine, Ser 9.14 0.57 - 1.00 mg/dL   eGFR 57 (L) >78 GN/FAO/1.30   BUN/Creatinine Ratio 17 12 - 28   Sodium 143 134 - 144 mmol/L   Potassium 4.0 3.5 - 5.2 mmol/L   Chloride 103 96 - 106 mmol/L   CO2 28 20 - 29 mmol/L   Calcium 9.7 8.7 - 10.3 mg/dL   Total Protein 7.3 6.0 - 8.5 g/dL   Albumin 4.5 3.7 - 4.7 g/dL   Globulin, Total 2.8 1.5 - 4.5 g/dL   Bilirubin Total 0.4 0.0 - 1.2 mg/dL   Alkaline Phosphatase 68 44 - 121 IU/L   AST 23 0 - 40 IU/L   ALT 16 0 - 32 IU/L  B12 and Folate Panel     Status: None   Collection Time: 12/01/22  9:48 AM  Result Value Ref Range   Vitamin B-12 792 232 - 1,245 pg/mL   Folate 8.6 >3.0 ng/mL    Comment:  A serum folate concentration of less than 3.1 ng/mL is considered to represent clinical deficiency.   Methylmalonic acid, serum     Status: None (Preliminary result)   Collection Time: 12/01/22  9:48 AM  Result Value Ref Range   Methylmalonic Acid WILL FOLLOW   Vitamin B1     Status: None (Preliminary result)   Collection Time: 12/01/22  9:48 AM  Result Value Ref Range   Thiamine WILL FOLLOW   RPR     Status: None   Collection Time: 12/01/22  9:48 AM  Result Value Ref Range   RPR Ser Ql Non Reactive Non Reactive  HIV Antibody (routine testing w rflx)     Status: None   Collection Time: 12/01/22  9:48 AM  Result Value Ref Range   HIV Screen 4th Generation wRfx Non Reactive Non Reactive    Comment: HIV-1/HIV-2 antibodies and HIV-1 p24 antigen were NOT detected. There is no laboratory evidence of HIV infection. HIV Negative   Homocysteine     Status: None   Collection Time: 12/01/22  9:48 AM  Result Value Ref Range   Homocysteine 11.0 0.0 - 21.3 umol/L  Ammonia     Status: None   Collection Time: 12/01/22  9:48 AM  Result Value Ref Range   Ammonia 42 28 - 135 ug/dL  APOE Alzheimer's Risk     Status: None (Preliminary result)   Collection Time: 12/01/22  9:48 AM  Result Value Ref Range   Methodology: WILL FOLLOW    APO E Genotyping  Result: WILL FOLLOW    Interpretation: WILL FOLLOW    Comment: WILL FOLLOW   ATN PROFILE     Status: None (Preliminary result)   Collection Time: 12/01/22  9:48 AM  Result Value Ref Range   A -- Beta-amyloid 42/40 Ratio WILL FOLLOW    Beta-amyloid 42 WILL FOLLOW    Beta-amyloid 40 WILL FOLLOW    T -- [id number] WILL FOLLOW    N -- NfL, Plasma WILL FOLLOW    ATN SUMMARY WILL FOLLOW    Information: WILL FOLLOW   Lipid Panel     Status: None   Collection Time: 12/01/22  9:48 AM  Result Value Ref Range   Cholesterol, Total 174 100 - 199 mg/dL   Triglycerides 161 0 - 149 mg/dL   HDL 66 >09 mg/dL   VLDL Cholesterol Cal 22 5 - 40 mg/dL   LDL Chol Calc (NIH) 86 0 - 99 mg/dL   Chol/HDL Ratio 2.6 0.0 - 4.4 ratio    Comment:                                   T. Chol/HDL Ratio                                             Men  Women                               1/2 Avg.Risk  3.4    3.3                                   Avg.Risk  5.0    4.4  2X Avg.Risk  9.6    7.1                                3X Avg.Risk 23.4   11.0   Hemoglobin A1c     Status: Abnormal   Collection Time: 12/01/22  9:48 AM  Result Value Ref Range   Hgb A1c MFr Bld 5.7 (H) 4.8 - 5.6 %    Comment:          Prediabetes: 5.7 - 6.4          Diabetes: >6.4          Glycemic control for adults with diabetes: <7.0    Est. average glucose Bld gHb Est-mCnc 117 mg/dL  TSH Rfx on Abnormal to Free T4     Status: None   Collection Time: 12/01/22  9:57 AM  Result Value Ref Range   TSH 0.620 0.450 - 4.500 uIU/mL  Urinalysis, Routine w reflex microscopic     Status: Abnormal   Collection Time: 12/01/22 10:08 AM  Result Value Ref Range   Specific Gravity, UA 1.021 1.005 - 1.030   pH, UA 6.5 5.0 - 7.5   Color, UA Yellow Yellow   Appearance Ur Clear Clear   Leukocytes,UA 2+ (A) Negative   Protein,UA Trace Negative/Trace   Glucose, UA Negative Negative   Ketones, UA Negative Negative   RBC, UA Trace  (A) Negative   Bilirubin, UA Negative Negative   Urobilinogen, Ur 0.2 0.2 - 1.0 mg/dL   Nitrite, UA Positive (A) Negative   Microscopic Examination See below:     Comment: Microscopic was indicated and was performed.  Microscopic Examination     Status: Abnormal   Collection Time: 12/01/22 10:08 AM  Result Value Ref Range   WBC, UA 0-5 0 - 5 /hpf   RBC, Urine 0-2 0 - 2 /hpf   Epithelial Cells (non renal) 0-10 0 - 10 /hpf   Casts None seen None seen /lpf   Bacteria, UA Many (A) None seen/Few  Culture, Urine     Status: Abnormal (Preliminary result)   Collection Time: 12/01/22 10:10 AM   Specimen: Urine   UR  Result Value Ref Range   Urine Culture, Routine Preliminary report (A)    Organism ID, Bacteria Gram negative rods (A)     Comment: Greater than 100,000 colony forming units per mL         MRI brain 2019:  IMPRESSION: Abnormal MRI scan of the brain showing remote age lacunar infarcts in both corona radiata and mild age-appropriate changes of chronic microvascular ischemia.  Incidental changes of mild chronic paranasal sinusitis and hypoplasia of the posterior circulation vessels is noted.  No acute abnormality is seen  CT head: 12/2020  Brain: There is no evidence of an acute infarct, intracranial hemorrhage, mass, midline shift, or extra-axial fluid collection. Patchy hypodensities in the cerebral white matter bilaterally are nonspecific but compatible with mild-to-moderate chronic small vessel ischemic disease. 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   Vascular: Calcified atherosclerosis at the skull base. No hyperdense vessel.   Skull: No fracture or suspicious osseous lesion.   Sinuses/Orbits: Visualized paranasal sinuses and mastoid air cells are  clear. Bilateral cataract extraction.   Other: None.  IMPRESSION: 1. No evidence of acute intracranial abnormality. 2. Mild-to-moderate chronic small vessel ischemic disease.    Review of Systems: Patient complains of symptoms per HPI as well as the following symptoms progressive memory decline. Pertinent negatives and positives per HPI. All others negative.   Social History   Socioeconomic History   Marital status: Divorced    Spouse name: Not on file   Number of children: 6   Years of education: 12   Highest education level: Not on file  Occupational History   Not on file  Tobacco Use   Smoking status: Never   Smokeless tobacco: Never  Vaping Use   Vaping Use: Never used  Substance and Sexual Activity   Alcohol use: Not Currently   Drug use: No   Sexual activity: Not on file  Other Topics Concern   Not on file  Social History Narrative   Lives at home alone   Right handed   1 cup of caffeine daily   Social Determinants of Health   Financial Resource Strain: Not on file  Food Insecurity: Not on file  Transportation Needs: Not on file  Physical Activity: Not on file  Stress: Not on file  Social Connections: Not on file  Intimate Partner Violence: Not on file    Family History  Problem Relation Age of Onset   Diabetes Mother    Heart disease Mother    Hypertension Mother    Prostate cancer Father     Past Medical History:  Diagnosis Date   Chest pain at rest, negative MI, negative stress test, may have been GI 12/06/2013   Diabetes mellitus without complication (HCC)    DOE (dyspnea on exertion), may be due to HTN 12/06/2013   GERD (gastroesophageal reflux disease)    H/O diabetes mellitus    HTN (hypertension) 12/06/2013   Hypercholesteremia    Hypertension    TIA (transient ischemic attack)     Patient Active Problem List   Diagnosis Date Noted   Aortic atherosclerosis (HCC) 03/18/2020   History of chest pain 03/19/2019   Chest pain 11/08/2017    Lumbar radiculopathy 05/02/2017   Precordial chest pain 12/06/2013   Chest pain at rest, negative MI, negative stress test, may have been GI 12/06/2013   Essential hypertension 12/06/2013   DOE (dyspnea on exertion), may be due to HTN 12/06/2013    Past Surgical History:  Procedure Laterality Date   CATARACT EXTRACTION, BILATERAL  2018   EYE SURGERY     FOOT SURGERY      Current Outpatient Medications  Medication Sig Dispense Refill   albuterol (PROVENTIL HFA;VENTOLIN HFA) 108 (90 Base) MCG/ACT inhaler Inhale 2 puffs into the lungs every 6 (six) hours as needed for wheezing or shortness of breath.     Albuterol Sulfate 2.5 MG/0.5ML NEBU as directed.     atorvastatin (LIPITOR) 20 MG tablet Take 20 mg by mouth daily.      cetirizine (ZYRTEC) 10 MG tablet Take 10 mg by mouth daily.     donepezil (ARICEPT) 5 MG tablet Take 1 tablet (5 mg total) by mouth at bedtime. 30 tablet 0   fluticasone (FLONASE) 50 MCG/ACT nasal spray Place 1 spray into both nostrils daily as needed.     triamterene-hydrochlorothiazide (MAXZIDE-25) 37.5-25 MG tablet Take 1 tablet by mouth daily. Take 1/2 tablet daily     nitroGLYCERIN (NITROSTAT) 0.4 MG SL tablet Place 1 tablet (0.4 mg total) under the tongue every 5 (five) minutes as needed for  chest pain. 25 tablet 3   sertraline (ZOLOFT) 100 MG tablet Take 1 tablet (100 mg total) by mouth at bedtime. 90 tablet 4   No current facility-administered medications for this visit.    Allergies as of 12/01/2022 - Review Complete 12/01/2022  Allergen Reaction Noted   Tomato Swelling and Rash 12/06/2013    Vitals: BP 122/84 (BP Location: Left Arm, Patient Position: Sitting, Cuff Size: Normal)   Pulse 86   Ht 5\' 4"  (1.626 m)   Wt 128 lb 12.8 oz (58.4 kg)   BMI 22.11 kg/m  Last Weight:  Wt Readings from Last 1 Encounters:  12/01/22 128 lb 12.8 oz (58.4 kg)   Last Height:   Ht Readings from Last 1 Encounters:  12/01/22 5\' 4"  (1.626 m)     Physical  exam: Exam: Gen: NAD, conversant, well nourised, obese, well groomed                     CV: RRR, no MRG. No Carotid Bruits. No peripheral edema, warm, nontender Eyes: Conjunctivae clear without exudates or hemorrhage  Neuro: Detailed Neurologic Exam  Speech:    Speech is normal; fluent and spontaneous with normal comprehension.  Cognition:     12/01/2022    8:09 AM  MMSE - Mini Mental State Exam  Orientation to time 3  Orientation to Place 5  Registration 3  Attention/ Calculation 1  Recall 1  Language- name 2 objects 2  Language- repeat 1  Language- follow 3 step command 3  Language- read & follow direction 1  Write a sentence 1  Copy design 0  Total score 21       The patient is oriented to person, place, and time;     recent and remote memory intact;     language fluent;     normal attention, concentration,     fund of knowledge Cranial Nerves:    The pupils are equal, round, and reactive to light. The fundi are normal and spontaneous venous pulsations are present. Visual fields are full to finger confrontation. Extraocular movements are intact. Trigeminal sensation is intact and the muscles of mastication are normal. The face is symmetric. The palate elevates in the midline. Hearing intact. Voice is normal. Shoulder shrug is normal. The tongue has normal motion without fasciculations.   Coordination:    Normal finger to nose and heel to shin. Normal rapid alternating movements.   Gait:    Heel-toe and tandem gait are normal.   Motor Observation:    No asymmetry, no atrophy, and no involuntary movements noted. Tone:    Normal muscle tone.    Posture:    Posture is normal. normal erect    Strength:    Strength is V/V in the upper and lower limbs.      Sensation: intact to LT     Reflex Exam:  DTR's:    Deep tendon reflexes in the upper and lower extremities are normal bilaterally.   Toes:    The toes are downgoing bilaterally.   Clonus:    Clonus is  absent.    Assessment/Plan:  Eniyah Vivenzio is a 89 y.o. female here as requested by Jackelyn Poling, DO for memory. has Precordial chest pain; Chest pain at rest, negative MI, negative stress test, may have been GI; Essential hypertension; DOE (dyspnea on exertion), may be due to HTN; Lumbar radiculopathy; Chest pain; History of chest pain; and Aortic atherosclerosis (HCC) on their problem list.  She  also has hypertension, left leg pain, gait disturbance, hyperlipidemia, Alzheimer's disease, anxiety, insomnia, postpolio syndrome, dysphagia, osteoporosis, asthma, chronic kidney disease arthritis and pain and headaches on her problem list when reviewing primary care notes.  Saw patient back in 2019 for similar symptoms.  She does have a history of dementia with psychiatric disturbance.  She has been on sertraline for a long time but low-dose.  I reviewed my notes from 2019, when we saw her she was already having paranoid delusions that someone is breaking into her apartment and moving things, her daughter had placed cameras in and around the house and did not see anybody.  She is here again with her daughter.  She has been having memory problems for many years.  It is more short-term memory and progressive.  She lives in senior living situation.  She has not been driving for years.  Ongoing and progressive since at least 2017.  She does have a history of dementia in her maternal aunt.  Remote memory is still okay but progressive short-term memory and behavioral symptoms.  Daughter provides most info. No falls.    Increase zoloft to 100mg  daily for mood at night In about a week if no side effects start Aricept/Donepezil 5mg  for memory and then we will increase to 10mg  if she does well on it in 2 weeks - mychart me. After that consider memantine/namenda after we titrate up on the Aricept Bloodwork and urine today MRI brain w/wo contrast to evaluate for reversible causes of memory loss In the bloodwork I am  going genetic test for alzheimers gene and bad alzheimers protein in the blood then we will order PET Amyloid in the brain to confirm alzheimer's disease then We can consider lecanemab if patient is eligible.  We did discuss it today briefly.  Will see what the testing shows. MMSE 21/30.  She also reports numbness and tingling in the hands, we can order an EMG nerve conduction study to evaluate for carpal tunnel syndrome bilateral uppers   Orders Placed This Encounter  Procedures   Culture, Urine   Microscopic Examination   MR BRAIN W WO CONTRAST   CBC with Differential/Platelets   Comprehensive metabolic panel   TSH Rfx on Abnormal to Free T4   B12 and Folate Panel   Methylmalonic acid, serum   Vitamin B1   RPR   HIV Antibody (routine testing w rflx)   Homocysteine   Ammonia   Urinalysis, Routine w reflex microscopic   APOE Alzheimer's Risk   ATN PROFILE   Lipid Panel   Hemoglobin A1c   NCV with EMG(electromyography)   Meds ordered this encounter  Medications   DISCONTD: sertraline (ZOLOFT) 100 MG tablet    Sig: Take 1 tablet (100 mg total) by mouth at bedtime. 2 tablets every night.    Dispense:  90 tablet    Refill:  4   donepezil (ARICEPT) 5 MG tablet    Sig: Take 1 tablet (5 mg total) by mouth at bedtime.    Dispense:  30 tablet    Refill:  0   sertraline (ZOLOFT) 100 MG tablet    Sig: Take 1 tablet (100 mg total) by mouth at bedtime.    Dispense:  90 tablet    Refill:  4    Please cancel all other sertraline prescriptions    Cc: Jackelyn Poling, DO,  Ileana Ladd, MD (Inactive)  Naomie Dean, MD  [facility] [address] [place], [place] [address]  Phone [phone] Fax [phone]

## 2022-12-02 LAB — CBC WITH DIFFERENTIAL/PLATELET
Basophils Absolute: 0.1 10*3/uL (ref 0.0–0.2)
Basos: 1 %
EOS (ABSOLUTE): 0.1 10*3/uL (ref 0.0–0.4)
MCH: 29.3 pg (ref 26.6–33.0)
RBC: 4.47 x10E6/uL (ref 3.77–5.28)
RDW: 12.4 % (ref 11.7–15.4)

## 2022-12-02 LAB — MICROSCOPIC EXAMINATION: Casts: NONE SEEN /lpf

## 2022-12-02 LAB — LIPID PANEL: Chol/HDL Ratio: 2.6 ratio (ref 0.0–4.4)

## 2022-12-02 LAB — URINALYSIS, ROUTINE W REFLEX MICROSCOPIC
Bilirubin, UA: NEGATIVE
Glucose, UA: NEGATIVE
Ketones, UA: NEGATIVE
Nitrite, UA: POSITIVE — AB
Specific Gravity, UA: 1.021 (ref 1.005–1.030)
Urobilinogen, Ur: 0.2 mg/dL (ref 0.2–1.0)
pH, UA: 6.5 (ref 5.0–7.5)

## 2022-12-02 LAB — COMPREHENSIVE METABOLIC PANEL
ALT: 16 IU/L (ref 0–32)
Bilirubin Total: 0.4 mg/dL (ref 0.0–1.2)
Calcium: 9.7 mg/dL (ref 8.7–10.3)
Chloride: 103 mmol/L (ref 96–106)
Glucose: 87 mg/dL (ref 70–99)

## 2022-12-02 LAB — ATN PROFILE

## 2022-12-02 LAB — TSH RFX ON ABNORMAL TO FREE T4: TSH: 0.62 u[IU]/mL (ref 0.450–4.500)

## 2022-12-03 LAB — COMPREHENSIVE METABOLIC PANEL
CO2: 28 mmol/L (ref 20–29)
Creatinine, Ser: 0.96 mg/dL (ref 0.57–1.00)
Potassium: 4 mmol/L (ref 3.5–5.2)

## 2022-12-03 LAB — HEMOGLOBIN A1C: Hgb A1c MFr Bld: 5.7 % — ABNORMAL HIGH (ref 4.8–5.6)

## 2022-12-03 LAB — CBC WITH DIFFERENTIAL/PLATELET
Eos: 3 %
Immature Grans (Abs): 0 10*3/uL (ref 0.0–0.1)
Monocytes Absolute: 0.3 10*3/uL (ref 0.1–0.9)
Monocytes: 7 %
Neutrophils Absolute: 2.9 10*3/uL (ref 1.4–7.0)

## 2022-12-03 LAB — LIPID PANEL: LDL Chol Calc (NIH): 86 mg/dL (ref 0–99)

## 2022-12-03 LAB — AMMONIA: Ammonia: 42 ug/dL (ref 28–135)

## 2022-12-03 LAB — ATN PROFILE

## 2022-12-03 LAB — METHYLMALONIC ACID, SERUM

## 2022-12-05 ENCOUNTER — Encounter: Payer: Self-pay | Admitting: Neurology

## 2022-12-06 ENCOUNTER — Telehealth: Payer: Self-pay | Admitting: Neurology

## 2022-12-06 ENCOUNTER — Other Ambulatory Visit: Payer: Self-pay | Admitting: Neurology

## 2022-12-06 LAB — URINE CULTURE

## 2022-12-06 LAB — COMPREHENSIVE METABOLIC PANEL
AST: 23 IU/L (ref 0–40)
BUN/Creatinine Ratio: 17 (ref 12–28)

## 2022-12-06 LAB — LIPID PANEL
Cholesterol, Total: 174 mg/dL (ref 100–199)
HDL: 66 mg/dL (ref >39)
Triglycerides: 123 mg/dL (ref 0–149)

## 2022-12-06 LAB — CBC WITH DIFFERENTIAL/PLATELET
Hematocrit: 40.4 % (ref 34.0–46.6)
Lymphocytes Absolute: 1.3 10*3/uL (ref 0.7–3.1)
Lymphs: 28 %
Platelets: 282 10*3/uL (ref 150–450)

## 2022-12-06 LAB — RPR: RPR Ser Ql: NONREACTIVE

## 2022-12-06 LAB — B12 AND FOLATE PANEL
Folate: 8.6 ng/mL (ref >3.0)
Vitamin B-12: 792 pg/mL (ref 232–1245)

## 2022-12-06 MED ORDER — NITROFURANTOIN MONOHYD MACRO 100 MG PO CAPS: 100.0000 mg | ORAL_CAPSULE | Freq: Two times a day (BID) | ORAL | 0 refills | Status: DC

## 2022-12-06 MED ORDER — CIPROFLOXACIN HCL 250 MG PO TABS: 250.0000 mg | ORAL_TABLET | Freq: Two times a day (BID) | ORAL | 0 refills | Status: DC

## 2022-12-06 NOTE — Telephone Encounter (Signed)
Called daughter for UTI will call in macrobid and forward to Dr. Atha Starks, Alycia Rossetti her pcp

## 2022-12-06 NOTE — Telephone Encounter (Signed)
Spoke to [place], canceled macrobid, her creatine clearance is only 38. Will try cipro for crcl 30-50 is 500mg  q12

## 2022-12-07 LAB — CBC WITH DIFFERENTIAL/PLATELET
Hemoglobin: 13.1 g/dL (ref 11.1–15.9)
MCV: 90 fL (ref 79–97)
Neutrophils: 61 %
WBC: 4.7 10*3/uL (ref 3.4–10.8)

## 2022-12-07 LAB — ATN PROFILE: T -- p-tau181: 1.24 pg/mL — ABNORMAL HIGH (ref 0.00–0.97)

## 2022-12-07 LAB — HIV ANTIBODY (ROUTINE TESTING W REFLEX): HIV Screen 4th Generation wRfx: NONREACTIVE

## 2022-12-07 LAB — HEMOGLOBIN A1C: Est. average glucose Bld gHb Est-mCnc: 117 mg/dL

## 2022-12-07 LAB — APOE ALZHEIMER'S RISK

## 2022-12-07 LAB — HOMOCYSTEINE: Homocysteine: 11 umol/L (ref 0.0–21.3)

## 2022-12-07 LAB — LIPID PANEL: VLDL Cholesterol Cal: 22 mg/dL (ref 5–40)

## 2022-12-07 LAB — COMPREHENSIVE METABOLIC PANEL: Albumin: 4.5 g/dL (ref 3.7–4.7)

## 2022-12-07 NOTE — Telephone Encounter (Signed)
Pt's daughter has returned the call to [facility], routing message to POD 4 for daughter to be called back

## 2022-12-07 NOTE — Telephone Encounter (Signed)
Contacted daughter, informed her of the change in medication. canceled macrobid, Will try cipro due to creatine. She verbally understood and was appreciative.

## 2022-12-09 ENCOUNTER — Telehealth: Payer: Self-pay | Admitting: Neurology

## 2022-12-09 LAB — COMPREHENSIVE METABOLIC PANEL
Alkaline Phosphatase: 68 IU/L (ref 44–121)
BUN: 16 mg/dL (ref 8–27)
Globulin, Total: 2.8 g/dL (ref 1.5–4.5)
Sodium: 143 mmol/L (ref 134–144)
Total Protein: 7.3 g/dL (ref 6.0–8.5)
eGFR: 57 mL/min/{1.73_m2} — ABNORMAL LOW (ref >59)

## 2022-12-09 LAB — ATN PROFILE: Beta-amyloid 42: 19.72 pg/mL

## 2022-12-09 LAB — CBC WITH DIFFERENTIAL/PLATELET
Immature Granulocytes: 0 %
MCHC: 32.4 g/dL (ref 31.5–35.7)

## 2022-12-09 LAB — APOE ALZHEIMER'S RISK

## 2022-12-09 LAB — VITAMIN B1: Thiamine: 96.4 nmol/L (ref 66.5–200.0)

## 2022-12-09 NOTE — Telephone Encounter (Signed)
medicare NPR, UMR NPR case [id number] sent to [facility] [phone]

## 2022-12-09 NOTE — Telephone Encounter (Signed)
She does not have an alzheimer's gene but she did have markers in her blood that can be consistent with alzheimer's disease. The next step would be to perform a PET Amyloid (in addition to the MRI brain) to see if she has the alzheimer's protein in the brain which, if positive, makes alzheimer's the likely conclusion. Ask if they would like this thanks - call daughter

## 2022-12-09 NOTE — Telephone Encounter (Addendum)
Contacted daughter, informed her MD reviewed lab results. Pt does not have an alzheimer's gene but she did have markers in her blood that can be consistent with alzheimer's disease. The next step would be to perform a PET Amyloid (in addition to the MRI brain) to see if she has the alzheimer's protein in the brain which, if positive, makes alzheimer's the likely conclusion. She agreed to doing PET Amyloid. Will send to MD to order.

## 2022-12-13 ENCOUNTER — Other Ambulatory Visit: Payer: Self-pay | Admitting: Neurology

## 2022-12-13 DIAGNOSIS — F0390 Unspecified dementia without behavioral disturbance: Secondary | ICD-10-CM

## 2022-12-13 NOTE — Telephone Encounter (Signed)
Ordered. thanks

## 2022-12-16 ENCOUNTER — Telehealth: Payer: Self-pay | Admitting: Neurology

## 2022-12-16 NOTE — Telephone Encounter (Signed)
medicare NPR, UMR NPR case [id number] sent to [facility] [phone]

## 2022-12-23 ENCOUNTER — Other Ambulatory Visit: Payer: Self-pay | Admitting: Neurology

## 2022-12-23 DIAGNOSIS — F03A2 Unspecified dementia, mild, with psychotic disturbance: Secondary | ICD-10-CM

## 2022-12-23 DIAGNOSIS — F22 Delusional disorders: Secondary | ICD-10-CM

## 2022-12-23 DIAGNOSIS — R410 Disorientation, unspecified: Secondary | ICD-10-CM

## 2022-12-23 DIAGNOSIS — G309 Alzheimer's disease, unspecified: Secondary | ICD-10-CM

## 2022-12-23 DIAGNOSIS — R7309 Other abnormal glucose: Secondary | ICD-10-CM

## 2022-12-23 DIAGNOSIS — I672 Cerebral atherosclerosis: Secondary | ICD-10-CM

## 2022-12-23 NOTE — Telephone Encounter (Signed)
Spoke to daughter checked (DPR) daughter wanted to know about appointment for pt's PET scan. Gave [place] # to call for updates about PET scan appointment

## 2022-12-27 ENCOUNTER — Telehealth: Payer: Self-pay | Admitting: *Deleted

## 2022-12-27 NOTE — Telephone Encounter (Signed)
Patient's daughter called. She feels there may be side effects to either Sertraline increase or addition of Aricept at last appt . Nothing else changed in medication.. Patient has complained to daughter of dizziness, feeling unsteady. While daughter was out of town about 3 weeks ago, the pt fell and hit her head. She was checked on by a family friend and daughter said she was told pt didn't have any "side effects from hitting her head". A few months ago patient had issues with dizziness and BP medicine was decreased which helped. Daughter feels symptoms now are due to either addition of Donepezil or increase in Sertraline  She doesn't recall any reports of diarrhea or loose stool.   Pt's daughter then added pt to call and she said she's fallen 3 times but the last times she didn't get hurt like she did with the earlier fall. She said the fall where she hurt her head was a fall into the dresser. She states she is staying hydrated. She is dizzy when she moves around. She denies any other symptoms. She states she feels steadier with her walker or some kind of support. I did recommend ER or urgent care for the fall when she hit her head. She denies any headache or any other new symptoms. 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 Daughter states she has seen her multiple times but hasn't noticed anything wrong with the patient. Patient has only mentioned the dizziness to her and the falls.    The daughter states she skipped the last couple nights of Donepezil (she was at the end of the first refill). Patient still states she has some dizziness however. I told daughter I would call her back with Dr Trevor Mace recommendations.

## 2022-12-30 NOTE — Telephone Encounter (Signed)
I called the pt's daughter Dennard Nip (on [place]) back and LVM (ok per DPR) advising her Dr Trevor Mace message below which includes Sertraline dose recommendation as well as recommending she check pt's BP to ensure she is getting fluids as well as contact Dr Nash Dimmer office for UTI recheck to ensure complete resolution of UTI. I left our office number and hours in message. I did ask for a call back to make sure she received message and let us know if she has any concerns.

## 2022-12-30 NOTE — Telephone Encounter (Signed)
I would decrease the zoloft back to 25mg  if they still have the old prescription, if they don't they can cut the 100mg  tab in half and see if she is still dizzy on 50mg .  Also, check her blood pressure and make sure she is getting fluids. Also would check to make sure that UTI resolved, she had a UTI at last appointment thanks

## 2023-01-03 NOTE — Telephone Encounter (Signed)
I called Cheri to follow-up on the VM that I left her on 8/1.  She confirmed she received the message on 8/2.  On Saturday 8/3 she decreased the Sertraline to 50 mg. The patient remains off Donepezil. She states the patient last told her she was still a little dizzy but she doesn't believe the patient hasn't been eating or drinking very good. She hasn't talked to the pt in 24 hours but plans to go there today and check on her and check BP. I encouraged her to remind the patient to be hydrating well too. She thanked me for the call. I told her I would double check instructions on Donepezil with Dr Lucia Gaskins.

## 2023-01-04 NOTE — Telephone Encounter (Signed)
From Dr Lucia Gaskins:  I would stay off of the donepezil for 2-3 weeks and if she is doing well on the sertraline 50mg  then re-introduce the donelezil and see how she does.   I called Cheri back and let her know Dr Trevor Mace message. She verbalized understanding and appreciation. Patient will be seeing PCP today. I encouraged her to talk to him about what has been going on and make sure BP is not contributing to dizziness.

## 2023-01-11 ENCOUNTER — Other Ambulatory Visit: Payer: Medicare Other

## 2023-01-24 ENCOUNTER — Other Ambulatory Visit (HOSPITAL_COMMUNITY): Payer: Medicare Other

## 2023-01-24 ENCOUNTER — Ambulatory Visit (HOSPITAL_COMMUNITY)
Admission: RE | Admit: 2023-01-24 | Discharge: 2023-01-24 | Disposition: A | Discharge status: Home / Self Care | Payer: Medicare Other | Source: Ambulatory Visit | Attending: Neurology | Admitting: Neurology

## 2023-01-24 DIAGNOSIS — F0390 Unspecified dementia without behavioral disturbance: Secondary | ICD-10-CM | POA: Insufficient documentation

## 2023-01-24 MED ORDER — FLORBETAPIR F 18 500-1900 MBQ/ML IV SOLN
10.0000 | Freq: Once | INTRAVENOUS | Status: AC
  Administered 2023-01-24: 8.13 via INTRAVENOUS

## 2023-02-02 ENCOUNTER — Telehealth: Payer: Self-pay | Admitting: *Deleted

## 2023-02-02 NOTE — Telephone Encounter (Addendum)
I spoke with the patient's daughter Dennard Nip (on [place]) and discussed the patient's PET CT amyloid scan results.  Her questions were answered and she verbalized understanding.  The patient will be here with daughter tomorrow morning at 8 AM for EMG.  She also mentioned that someone called her and rescheduled the patient's MRI so it would be next Tuesday 9/10 at 3:30 pm. The daughter also has her own personal appointment with Dr. Lucia Gaskins next Monday 9/9 at 4 PM.  Verbalized appreciation for the call.  ----- Message from Anson Fret sent at 01/31/2023  3:40 PM EDT ----- She does not have an alzheimer's gene. Unfortunately she haspositive markers in the blood for alzheimers disease as well as some mild amyloid plaque in the brain. This is not entriely diagnostic of alzheimer's but concerning for alzheimer's disease. I see her in early September can discuss woith her then thanks

## 2023-02-03 ENCOUNTER — Encounter: Payer: Commercial Managed Care - PPO | Admitting: Neurology

## 2023-02-03 NOTE — Progress Notes (Deleted)
History: Numbness and tingling in the hands reported by patient, EMG nerve conduction study to evaluate for carpal tunnel syndrome or other etiology in the bilateral uppers.

## 2023-02-08 ENCOUNTER — Ambulatory Visit
Admission: RE | Admit: 2023-02-08 | Discharge: 2023-02-08 | Disposition: A | Discharge status: Home / Self Care | Payer: Medicare Other | Source: Ambulatory Visit | Attending: Neurology

## 2023-02-08 DIAGNOSIS — F03A2 Unspecified dementia, mild, with psychotic disturbance: Secondary | ICD-10-CM

## 2023-02-08 DIAGNOSIS — F22 Delusional disorders: Secondary | ICD-10-CM

## 2023-02-08 DIAGNOSIS — I672 Cerebral atherosclerosis: Secondary | ICD-10-CM

## 2023-02-08 DIAGNOSIS — R410 Disorientation, unspecified: Secondary | ICD-10-CM

## 2023-02-08 DIAGNOSIS — G309 Alzheimer's disease, unspecified: Secondary | ICD-10-CM

## 2023-02-08 MED ORDER — GADOPICLENOL 0.5 MMOL/ML IV SOLN
6.0000 mL | Freq: Once | INTRAVENOUS | Status: AC | PRN
Start: 2023-02-08 — End: 2023-02-08
  Administered 2023-02-08: 6 mL via INTRAVENOUS

## 2023-02-11 ENCOUNTER — Other Ambulatory Visit: Payer: Self-pay | Admitting: Neurology

## 2023-02-14 ENCOUNTER — Telehealth: Payer: Self-pay | Admitting: *Deleted

## 2023-02-14 NOTE — Telephone Encounter (Signed)
I have several people waiting for an open emg/ncs, please tell her daughter Jacqueline Foley that I am trying but now we are apparently uch further out. Jacqueline Foley was back today and I will discuss with her thanks  Jacqueline Foley can you review the schedule and see if there is any place we can place 2 people, Ms. Makki being one of them thanks

## 2023-02-14 NOTE — Telephone Encounter (Signed)
Spoke with patient's daughter, Jacqueline Foley (on [place]) and discussed patient's MRI brain results as noted below.  She verbalized understanding and appreciation.  She had to reschedule the EMG to 04/21/2023 but she mentioned Dr. Lucia Gaskins discussed possibly getting her in sooner than November.  At this moment I do not see anything sooner but I told her I would reach out to Dr. Lucia Gaskins.

## 2023-02-14 NOTE — Telephone Encounter (Signed)
-----   Message from Anson Fret sent at 02/10/2023  7:40 PM EDT ----- MRI of the brain appears unreamarkable for age, nothing concenring.  No strokes or masses or concerning blood vessel occlusion. This does not rule out other syndromes like memory loss but otherwise normal for age.

## 2023-03-01 NOTE — Telephone Encounter (Signed)
Spoke with patient's daughter and was able to move her appt up to this Thursday 10/3. [employer] bus is scheduled to bring her and pick her up.

## 2023-03-03 ENCOUNTER — Ambulatory Visit (INDEPENDENT_AMBULATORY_CARE_PROVIDER_SITE_OTHER): Payer: Medicare Other | Admitting: Neurology

## 2023-03-03 ENCOUNTER — Ambulatory Visit (INDEPENDENT_AMBULATORY_CARE_PROVIDER_SITE_OTHER): Payer: Self-pay | Admitting: Neurology

## 2023-03-03 DIAGNOSIS — G5603 Carpal tunnel syndrome, bilateral upper limbs: Secondary | ICD-10-CM

## 2023-03-03 DIAGNOSIS — Z0289 Encounter for other administrative examinations: Secondary | ICD-10-CM

## 2023-03-03 NOTE — Progress Notes (Signed)
Full Name: Jacqueline Foley Gender: Female MRN #: [id number] Date of Birth: 12-25-1932    Visit Date: 03/03/2023 08:15 Age: 89 Years Examining Physician: Dr. Naomie Dean Referring Physician: Dr. Naomie Dean Height: 5 feet 4 inch    History: Bilateral numbness and tingling in the fingers Summary: EMG/Nerve Conduction Studies were performed on the bilateral upper extremities: The right median APB motor nerve showed prolonged distal onset latency (5.2 ms, N<4.4). The right Median 2nd Digit orthodromic sensory nerve showed prolonged distal peak latency (4.8 ms, N<3.4) and reduced amplitude(4 uV, N>10). The left  Median 2nd Digit orthodromic sensory nerve showed prolonged distal peak latency (3.9 ms, N<3.4) and reduced amplitude(7 uV, N>10). The right median/ulnar (palm) comparison nerve showed prolonged distal peak latency (Median Palm, 3.3 ms, N<2.2) and abnormal peak latency difference (Median Palm-Ulnar Palm, 1.58ms, N<0.4) with a relative median delay.  The left median/ulnar (palm) comparison nerve showed prolonged distal peak latency (Median Palm, 2.7 ms, N<2.2) and abnormal peak latency difference (Median Palm-Ulnar Palm, 0. ms, N<0.4) with a relative median delay.  All remaining nerves (as indicated in the following tables) were within normal limits.  Right opponens pollicis showed prolonged motor unit duration, polyphasic motor units, increased motor unit amplitude and diminished motor unit recruitment. All remaining muscles (as indicated in the following tables) were within normal limits.     Conclusion: This is an abnormal study. There is electrophysiologic evidence of moderately-severe right Carpal Tunnel Syndrome and left mild Carpal Tunnel Syndrome.  No suggestion of polyneuropathy  or radiculopathy.     ------------------------------- Naomie Dean, M.D.  [facility] [address], [address] [place], [place] [address] Tel: [phone] Fax:  [phone]  Verbal informed consent was obtained from the patient, patient was informed of potential risk of procedure, including bruising, bleeding, hematoma formation, infection, muscle weakness, muscle pain, numbness, among others.        MNC    Nerve / Sites Muscle Latency Ref. Amplitude Ref. Rel Amp Segments Distance Velocity Ref. Area    ms ms mV mV %  cm m/s m/s mVms  R Median - APB     Wrist APB 5.2 <=4.4 4.5 >=4.0 100 Wrist - APB 7   16.6     Upper arm APB 10.2  3.0  65.8 Upper arm - Wrist 26 52 >=49 11.2  L Median - APB     Wrist APB 4.0 <=4.4 8.1 >=4.0 100 Wrist - APB 7   26.5     Upper arm APB 8.6  8.2  101 Upper arm - Wrist 25 53 >=49 27.4  R Ulnar - ADM     Wrist ADM 3.1 <=3.3 7.0 >=6.0 100 Wrist - ADM 7   20.9     B.Elbow ADM 4.9  6.9  99.2 B.Elbow - Wrist 12 65 >=49 19.6     A.Elbow ADM 8.3  6.5  93.5 A.Elbow - B.Elbow 19 57 >=49 17.9  L Ulnar - ADM     Wrist ADM 3.0 <=3.3 8.2 >=6.0 100 Wrist - ADM 7   24.3     B.Elbow ADM 4.8  8.3  101 B.Elbow - Wrist 11 62 >=49 24.1     A.Elbow ADM 8.0  7.3  87.9 A.Elbow - B.Elbow 18 55 >=49 23.1             SNC    Nerve / Sites Rec. Site Peak Lat Ref.  Amp Ref. Segments Distance Peak Diff Ref.  ms ms V V  cm ms ms  L Radial - Anatomical snuff box (Forearm)     Forearm Wrist 2.7 <=2.9 19 >=15 Forearm - Wrist 10    R Median, Ulnar - Transcarpal comparison     Median Palm Wrist 3.3 <=2.2 18 >=35 Median Palm - Wrist 8       Ulnar Palm Wrist 2.3 <=2.2 10 >=12 Ulnar Palm - Wrist 8          Median Palm - Ulnar Palm  1.0 <=0.4  L Median, Ulnar - Transcarpal comparison     Median Palm Wrist 2.7 <=2.2 23 >=35 Median Palm - Wrist 8       Ulnar Palm Wrist 2.3 <=2.2 9 >=12 Ulnar Palm - Wrist 8          Median Palm - Ulnar Palm  0.4 <=0.4  R Median - Orthodromic (Dig II, Mid palm)     Dig II Wrist 4.8 <=3.4 4 >=10 Dig II - Wrist 13    L Median - Orthodromic (Dig II, Mid palm)     Dig II Wrist 3.9 <=3.4 7 >=10 Dig II - Wrist 13     R Ulnar - Orthodromic, (Dig V, Mid palm)     Dig V Wrist 3.1 <=3.1 5 >=5 Dig V - Wrist 11    L Ulnar - Orthodromic, (Dig V, Mid palm)     Dig V Wrist 3.1 <=3.1 5 >=5 Dig V - Wrist 90                     F  Wave    Nerve F Lat Ref.   ms ms  R Ulnar - ADM 27.6 <=32.0  L Ulnar - ADM 29.5 <=32.0         EMG Summary Table    Spontaneous MUAP Recruitment  Muscle IA Fib PSW Fasc Other Amp Dur. Poly Pattern  R. Deltoid Normal None None None _______ Normal Normal Normal Normal  R. Triceps brachii Normal None None None _______ Normal Normal Normal Normal  R. Pronator teres Normal None None None _______ Normal Normal Normal Normal  L. Deltoid Normal None None None _______ Normal Normal Normal Normal  L. Triceps brachii Normal None None None _______ Normal Normal Normal Normal  L. Pronator teres Normal None None None _______ Normal Normal Normal Normal  L. First dorsal interosseous Normal None None None _______ Normal Normal Normal Normal  R. First dorsal interosseous Normal None None None _______ Normal Normal Normal Normal  L. Opponens pollicis Normal None None None _______ Normal Normal Normal Normal  R. Opponens pollicis Normal None None None _______ Increased Increased 2+ Reduced

## 2023-03-12 ENCOUNTER — Emergency Department (HOSPITAL_COMMUNITY)
Admission: EM | Admit: 2023-03-12 | Discharge: 2023-03-12 | Disposition: A | Discharge status: Home / Self Care | Payer: Medicare Other | Attending: Emergency Medicine | Admitting: Emergency Medicine

## 2023-03-12 ENCOUNTER — Emergency Department (HOSPITAL_COMMUNITY): Payer: Medicare Other

## 2023-03-12 ENCOUNTER — Encounter (HOSPITAL_COMMUNITY): Payer: Self-pay | Admitting: Emergency Medicine

## 2023-03-12 ENCOUNTER — Other Ambulatory Visit: Payer: Self-pay

## 2023-03-12 DIAGNOSIS — E876 Hypokalemia: Secondary | ICD-10-CM | POA: Insufficient documentation

## 2023-03-12 DIAGNOSIS — N390 Urinary tract infection, site not specified: Secondary | ICD-10-CM | POA: Diagnosis not present

## 2023-03-12 DIAGNOSIS — R4182 Altered mental status, unspecified: Secondary | ICD-10-CM | POA: Insufficient documentation

## 2023-03-12 DIAGNOSIS — R569 Unspecified convulsions: Secondary | ICD-10-CM | POA: Diagnosis not present

## 2023-03-12 DIAGNOSIS — R519 Headache, unspecified: Secondary | ICD-10-CM | POA: Diagnosis present

## 2023-03-12 LAB — COMPREHENSIVE METABOLIC PANEL
ALT: 16 U/L (ref 0–44)
AST: 22 U/L (ref 15–41)
Albumin: 4.2 g/dL (ref 3.5–5.0)
Alkaline Phosphatase: 57 U/L (ref 38–126)
Anion gap: 8 (ref 5–15)
BUN: 19 mg/dL (ref 8–23)
CO2: 26 mmol/L (ref 22–32)
Calcium: 9 mg/dL (ref 8.9–10.3)
Chloride: 105 mmol/L (ref 98–111)
Creatinine, Ser: 0.97 mg/dL (ref 0.44–1.00)
GFR, Estimated: 56 mL/min — ABNORMAL LOW (ref >60)
Glucose, Bld: 102 mg/dL — ABNORMAL HIGH (ref 70–99)
Potassium: 3.2 mmol/L — ABNORMAL LOW (ref 3.5–5.1)
Sodium: 139 mmol/L (ref 135–145)
Total Bilirubin: 0.7 mg/dL (ref 0.3–1.2)
Total Protein: 7.9 g/dL (ref 6.5–8.1)

## 2023-03-12 LAB — URINALYSIS, ROUTINE W REFLEX MICROSCOPIC
Bilirubin Urine: NEGATIVE
Glucose, UA: NEGATIVE mg/dL
Ketones, ur: 5 mg/dL — AB
Nitrite: POSITIVE — AB
Protein, ur: NEGATIVE mg/dL
Specific Gravity, Urine: 1.023 (ref 1.005–1.030)
pH: 5 (ref 5.0–8.0)

## 2023-03-12 LAB — CBC WITH DIFFERENTIAL/PLATELET
Abs Immature Granulocytes: 0.01 10*3/uL (ref 0.00–0.07)
Basophils Absolute: 0 10*3/uL (ref 0.0–0.1)
Basophils Relative: 1 %
Eosinophils Absolute: 0 10*3/uL (ref 0.0–0.5)
Eosinophils Relative: 1 %
HCT: 38.3 % (ref 36.0–46.0)
Hemoglobin: 12 g/dL (ref 12.0–15.0)
Immature Granulocytes: 0 %
Lymphocytes Relative: 17 %
Lymphs Abs: 1 10*3/uL (ref 0.7–4.0)
MCH: 28 pg (ref 26.0–34.0)
MCHC: 31.3 g/dL (ref 30.0–36.0)
MCV: 89.3 fL (ref 80.0–100.0)
Monocytes Absolute: 0.3 10*3/uL (ref 0.1–1.0)
Monocytes Relative: 6 %
Neutro Abs: 4.3 10*3/uL (ref 1.7–7.7)
Neutrophils Relative %: 75 %
Platelets: 244 10*3/uL (ref 150–400)
RBC: 4.29 MIL/uL (ref 3.87–5.11)
RDW: 14.8 % (ref 11.5–15.5)
WBC: 5.6 10*3/uL (ref 4.0–10.5)
nRBC: 0 % (ref 0.0–0.2)

## 2023-03-12 MED ORDER — CEFDINIR 300 MG PO CAPS: 300.0000 mg | ORAL_CAPSULE | Freq: Two times a day (BID) | ORAL | 0 refills | Status: AC

## 2023-03-12 MED ORDER — POTASSIUM CHLORIDE CRYS ER 20 MEQ PO TBCR
40.0000 meq | EXTENDED_RELEASE_TABLET | Freq: Once | ORAL | Status: AC
  Administered 2023-03-12: 40 meq via ORAL
  Filled 2023-03-12: qty 2

## 2023-03-12 NOTE — ED Notes (Signed)
Patient transported to CT 

## 2023-03-12 NOTE — ED Provider Notes (Addendum)
[facility] [facility]GENCY DEPARTMENT AT [facility] Provider Note   CSN: [id number] Arrival date & time: 03/12/23  1236     History  Chief Complaint  Patient presents with   Seizures    Jacqueline Foley is a 89 y.o. female.  This is a 89 year old female who presents after period of ultimately status which began today.  Patient was getting of a car and according to her family who is at her side patient slumped to the ground but did not suffer any injury.  They noted that she had no change in her respirations.  No change in color of her face.  States that she had some shaking her eyes were open.  For between 5 and 10 minutes she would not respond to the voice.  Patient slowly came back to normal.  Patient has not had any recent medication changes.  No recent illnesses.  No prior history of trauma.  Patient himself denies any headache or urinary symptoms.  Feels at her baseline       Home Medications Prior to Admission medications   Medication Sig Start Date End Date Taking? Authorizing Provider  ciprofloxacin (CIPRO) 250 MG tablet Take 1 tablet (250 mg total) by mouth 2 (two) times daily. 12/06/22   Anson Fret, MD  albuterol (PROVENTIL HFA;VENTOLIN HFA) 108 (90 Base) MCG/ACT inhaler Inhale 2 puffs into the lungs every 6 (six) hours as needed for wheezing or shortness of breath.    [provider]  Albuterol Sulfate 2.5 MG/0.5ML NEBU as directed.    [provider]  atorvastatin (LIPITOR) 20 MG tablet Take 20 mg by mouth daily.     [provider]  cetirizine (ZYRTEC) 10 MG tablet Take 10 mg by mouth daily.    [provider]  donepezil (ARICEPT) 5 MG tablet TAKE 1 TABLET BY MOUTH EVERYDAY AT BEDTIME 12/29/22   Anson Fret, MD  fluticasone (FLONASE) 50 MCG/ACT nasal spray Place 1 spray into both nostrils daily as needed.    [provider]  nitrofurantoin, macrocrystal-monohydrate, (MACROBID) 100 MG capsule Take 1 capsule (100  mg total) by mouth 2 (two) times daily. 12/06/22   Anson Fret, MD  nitroGLYCERIN (NITROSTAT) 0.4 MG SL tablet Place 1 tablet (0.4 mg total) under the tongue every 5 (five) minutes as needed for chest pain. 07/13/21 10/11/21  Jodelle Red, MD  sertraline (ZOLOFT) 100 MG tablet Take 1 tablet (100 mg total) by mouth at bedtime. 12/01/22   Anson Fret, MD  triamterene-hydrochlorothiazide (MAXZIDE-25) 37.5-25 MG tablet Take 1 tablet by mouth daily. Take 1/2 tablet daily 03/03/20   [provider]      Allergies    Tomato    Review of Systems   Review of Systems  All other systems reviewed and are negative.   Physical Exam Updated Vital Signs BP (!) 158/96 (BP Location: Left Arm)   Pulse 75   Temp 98.2 F (36.8 C) (Oral)   Resp 20   Ht 1.626 m (5\' 4" )   Wt 55.3 kg   SpO2 100%   BMI 20.94 kg/m  Physical Exam Vitals and nursing note reviewed.  Constitutional:      General: She is not in acute distress.    Appearance: Normal appearance. She is well-developed. She is not toxic-appearing.  HENT:     Head: Normocephalic and atraumatic.  Eyes:     General: Lids are normal.     Conjunctiva/sclera: Conjunctivae normal.     Pupils:  Pupils are equal, round, and reactive to light.  Neck:     Thyroid: No thyroid mass.     Trachea: No tracheal deviation.  Cardiovascular:     Rate and Rhythm: Normal rate and regular rhythm.     Heart sounds: Normal heart sounds. No murmur heard.    No gallop.  Pulmonary:     Effort: Pulmonary effort is normal. No respiratory distress.     Breath sounds: Normal breath sounds. No stridor. No decreased breath sounds, wheezing, rhonchi or rales.  Abdominal:     General: There is no distension.     Palpations: Abdomen is soft.     Tenderness: There is no abdominal tenderness. There is no rebound.  Musculoskeletal:        General: No tenderness. Normal range of motion.     Cervical back: Normal range of motion and neck supple.   Skin:    General: Skin is warm and dry.     Findings: No abrasion or rash.  Neurological:     General: No focal deficit present.     Mental Status: She is alert and oriented to person, place, and time. Mental status is at baseline.     GCS: GCS eye subscore is 4. GCS verbal subscore is 5. GCS motor subscore is 6.     Cranial Nerves: Cranial nerves are intact. No cranial nerve deficit.     Sensory: No sensory deficit.     Motor: Motor function is intact.  Psychiatric:        Attention and Perception: Attention normal.        Speech: Speech normal.        Behavior: Behavior normal.     ED Results / Procedures / Treatments   Labs (all labs ordered are listed, but only abnormal results are displayed) Labs Reviewed  CBC WITH DIFFERENTIAL/PLATELET  COMPREHENSIVE METABOLIC PANEL  URINALYSIS, ROUTINE W REFLEX MICROSCOPIC    EKG EKG Interpretation Date/Time:  Saturday March 12 2023 13:30:38 EDT Ventricular Rate:  79 PR Interval:  147 QRS Duration:  97 QT Interval:  438 QTC Calculation: 503 R Axis:   -36  Text Interpretation: Sinus rhythm Left atrial enlargement Abnormal R-wave progression, early transition LVH with secondary repolarization abnormality Prolonged QT interval Confirmed by Lorre Nick ([id number]) on 03/12/2023 1:35:27 PM  Radiology No results found.  Procedures Procedures    Medications Ordered in ED Medications - No data to display  ED Course/ Medical Decision Making/ A&P                                 Medical Decision Making Amount and/or Complexity of Data Reviewed Labs: ordered. Radiology: ordered. ECG/medicine tests: ordered.  Risk Prescription drug management.   Patient with EKG which shows no sign of acute ischemic changes.  Normal sinus rhythm.  Mild hypokalemia noted and patient given oral potassium.  Patient's head CT without any acute findings here.  Patient's urinalysis positive for infection here.  Recently treated for UTI.  Will  place back on antibiotics.  She does not dose polyuria.  Patient possibly had a seizure that was unclear.  She is not orthostatic here.  Hemoglobin stable.  Will discharge home and she will follow-up with her neurologist       Final Clinical Impression(s) / ED Diagnoses Final diagnoses:  None    Rx / DC Orders ED Discharge Orders     None  Lorre Nick, MD 03/12/23 1515    Lorre Nick, MD 03/12/23 [phone]

## 2023-03-12 NOTE — ED Triage Notes (Signed)
Pt BIB by [facility]. Pt was going for lunch with family when pt started feeling weak on left side and started going down. They believe she experience a seizure.It lasted for 1-2 mins. Family explains that pt started acting confuse before episode. Does not have hx of seizure. EMS called. Stroke exam assesed at scene-negative. Pt currently has knee pain 6/10.  NAD.   BP 161/92, HR 78, Spo2 100 RA, CBG 112

## 2023-03-14 ENCOUNTER — Telehealth: Payer: Self-pay | Admitting: Neurology

## 2023-03-14 DIAGNOSIS — R4182 Altered mental status, unspecified: Secondary | ICD-10-CM

## 2023-03-14 NOTE — Telephone Encounter (Signed)
I called the pt's daughter Dennard Nip (on [place]) and LVM (ok per DPR) advising her that Dr Lucia Gaskins is aware of her mother's ER visit for possible seizure. She recommends ordering an EEG first, routine in-office. If that is normal, we will proceed with ambulatory EEG at home. I asked Cheri to call us back and we can schedule her mother to see Dr Lucia Gaskins at next availability which is currently March but in the meantime will order EEG and place on wait list.

## 2023-03-14 NOTE — Telephone Encounter (Signed)
I spoke with Dr Lucia Gaskins. We can schedule patient with her next available and in the meantime offer EEG. If the routine in-office EEG comes back normal, order a 3 day ambulatory in-home EEG.

## 2023-03-14 NOTE — Telephone Encounter (Signed)
Reviewed chart. Pt went to ER. EKG ok. They did find a UTI. They placed pt back on Abx. They said pt was not orthostatic there. They said ?seizure, f/u with neurology.

## 2023-03-14 NOTE — Telephone Encounter (Signed)
From ER visit

## 2023-03-14 NOTE — Telephone Encounter (Signed)
Pt's daughter is asking for a call to discuss episode pt had on Saturday causing her to pass out.  Unsure if it were a seizure pt had, please call daughter to discuss.

## 2023-04-01 ENCOUNTER — Telehealth: Payer: Self-pay | Admitting: Neurology

## 2023-04-01 NOTE — Telephone Encounter (Signed)
Pt daughter is asking for a call to discuss if there is to be an increase on donepezil (ARICEPT) 5 MG tablet,  sertraline (ZOLOFT) 100 MG tablet for pt

## 2023-04-04 ENCOUNTER — Ambulatory Visit (INDEPENDENT_AMBULATORY_CARE_PROVIDER_SITE_OTHER): Payer: Medicare Other

## 2023-04-04 ENCOUNTER — Encounter: Payer: Self-pay | Admitting: Podiatry

## 2023-04-04 ENCOUNTER — Ambulatory Visit (INDEPENDENT_AMBULATORY_CARE_PROVIDER_SITE_OTHER): Payer: Medicare Other | Admitting: Podiatry

## 2023-04-04 VITALS — Ht 64.0 in | Wt 122.0 lb

## 2023-04-04 DIAGNOSIS — M79672 Pain in left foot: Secondary | ICD-10-CM

## 2023-04-04 DIAGNOSIS — M21611 Bunion of right foot: Secondary | ICD-10-CM

## 2023-04-04 DIAGNOSIS — M19072 Primary osteoarthritis, left ankle and foot: Secondary | ICD-10-CM | POA: Diagnosis not present

## 2023-04-04 DIAGNOSIS — M79671 Pain in right foot: Secondary | ICD-10-CM

## 2023-04-04 DIAGNOSIS — M2011 Hallux valgus (acquired), right foot: Secondary | ICD-10-CM | POA: Diagnosis not present

## 2023-04-04 DIAGNOSIS — M2012 Hallux valgus (acquired), left foot: Secondary | ICD-10-CM

## 2023-04-04 DIAGNOSIS — M21612 Bunion of left foot: Secondary | ICD-10-CM

## 2023-04-04 MED ORDER — MELOXICAM 7.5 MG PO TABS
7.5000 mg | ORAL_TABLET | Freq: Every day | ORAL | 0 refills | Status: DC
Start: 2023-04-04 — End: 2023-05-01

## 2023-04-04 NOTE — Progress Notes (Unsigned)
Chief Complaint  Patient presents with   Bunions    Bilateral bunion on feet causing pain.   HPI: 89 y.o. female presents today for bunion evaluation.  Patient has recent history of fall(s).  Patient is unsure at this time as to the cause of her falls.  Her daughter is with her today.  Patient states she is ready for surgery to fix them.  Notes they hurt whether or not she wears shoes.  They mentioned she was on gabapentin in the past and wondered if this may help.  Past Medical History:  Diagnosis Date   Chest pain at rest, negative MI, negative stress test, may have been GI 12/06/2013   Diabetes mellitus without complication (HCC)    DOE (dyspnea on exertion), may be due to HTN 12/06/2013   GERD (gastroesophageal reflux disease)    H/O diabetes mellitus    HTN (hypertension) 12/06/2013   Hypercholesteremia    Hypertension    TIA (transient ischemic attack)    Past Surgical History:  Procedure Laterality Date   CATARACT EXTRACTION, BILATERAL  2018   EYE SURGERY     FOOT SURGERY     Allergies  Allergen Reactions   Tomato Swelling and Rash   Review of Systems  Musculoskeletal:  Positive for joint pain.     PHYSICAL EXAM: General: The patient is alert and oriented x3 in no acute distress.  Dermatology: Skin is warm, dry and supple bilateral lower extremities. Interspaces are clear of maceration and debris.  No rashes noted.   Vascular: Palpable pedal pulses bilaterally. Capillary refill within normal limits.  No appreciable edema.  No erythema or calor.  Neurological: Light touch sensation grossly intact bilateral feet.   Musculoskeletal Exam:  There is a bony medial prominence on the dorsomedial aspect of the 1st metatarsal head of the bilateral foot.  There is pain on palpation of the "bump" in this area.  Lateral deviation of the hallux at the MPJ level.  1st MPJ ROM is decreased.  No crepitus.  Hallux is tracking, not trackbound.  Severe stiffness with attempted ROM of left  ankle and STJ.    RADIOGRAPHIC EXAM (Bilateral foot, 3wb views, 04/04/23):  Increased first intermetatarsal angle bilateral with joint space narrowing at 1st MPJ bilateral.  Increased hallux abductus angle.  Enlargement of bone at dorsomedial 1st metatarsal head.  Tibial sesamoid position is 5 bilateral. Diffuse osteopenia of foot bones.  Left pantalar fusion with no hardware retained.  ASSESSMENT/PLAN OF CARE: 1. Bilateral bunions   2. Foot pain, bilateral   3. 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    Dispense:  15 tablet    Refill:  0   Discussed patient's condition, foot x-rays, and possible bunion pain etiologies today.  Discussed conservative treatment options with patient today, including shoe modification / arch supports, off-loading, cortisone injectione, NSAID topical / oral therapy, and toe splints and shields.    Rx for [employer] for left AFO with mild rocker bottom shoes to assist with ambulation, keeping in mind her recent fall history.    I don't feel she is a candidate for surgery at this time due to fall risk.  I also don't feel she is a candidate for gabapentin due to side effects increasing fall  risk.    Rx short course of meloxicam 7.5mg  to see if this helps her pain.  If so, she needs to speak with her cardiologist/PCP to see if this will be safe for her to take long-term before refills are sent in.  F/u prn    Clerance Lav, DPM, FACFAS [facility]     [address] [place], [place] [address]                Office [phone]  Fax [phone]

## 2023-04-04 NOTE — Telephone Encounter (Signed)
Spoke to daughter (checked DPR) Daughter states pt is currently on 5 mg Aricept and 50 mg Zoloft  daughter states pt is tolerating Aricept no diarrhea and  stomach pain Daughter wanted to know if an increase for Aricept and Zoloft is needed. Daughter states pt made her aware of a fall last Wednesday and hit her knee Daughter states pt said didn't hit her head No ED visit required Will forward to Dr Lucia Gaskins to inquire about request of  dosage change . Daughter thanked me calling

## 2023-04-06 NOTE — Telephone Encounter (Signed)
Patient had an EKG in the emergency room recently and she had prolonged QTc. Unfortunately in this case I would stop the Aricept and repeat the EKG. A long QTc can increase the risk of arrhythmia and aricept may make that worse. She did not have this on last EKG. I would stop the aricept and repeat the EKG in a few weeks or see cardiology. Has she been having any chest pain or shortness of breath, dizziness or lightheadedness? If so I would stop the aricept and send her to cardiology. If not I would still stop the aricept and repeat the EKG. I would address this first prior to any zoloft increases thanks. She can just stop the aricept. If she agrees to EKG in a few weeks I can order it, let me know thanks.

## 2023-04-07 NOTE — Telephone Encounter (Signed)
LVM for Daughter to call back to discuss Dr Lucia Gaskins recommendation for pt .

## 2023-04-11 ENCOUNTER — Encounter: Payer: Commercial Managed Care - PPO | Admitting: Neurology

## 2023-04-11 NOTE — Telephone Encounter (Signed)
I called daughter of pt and asked for her to call us about the aricept medication.

## 2023-04-19 ENCOUNTER — Ambulatory Visit (INDEPENDENT_AMBULATORY_CARE_PROVIDER_SITE_OTHER): Payer: Medicare Other | Admitting: Neurology

## 2023-04-19 DIAGNOSIS — R4182 Altered mental status, unspecified: Secondary | ICD-10-CM | POA: Diagnosis not present

## 2023-04-19 NOTE — Procedures (Signed)
   History:  89 year old woman with seizure vs. Syncope iso UTI  EEG classification:  Awake and asleep  Duration: 25 minutes   Technical aspects: This EEG study was done with scalp electrodes positioned according to the 10-20 International system of electrode placement. Electrical activity was reviewed with band pass filter of 1-70Hz , sensitivity of 7 uV/mm, display speed of 68mm/sec with a 60Hz  notched filter applied as appropriate. EEG data were recorded continuously and digitally stored.   Description of the recording: The background rhythms of this recording consists of a fairly well modulated medium amplitude background activity of 7 Hz. As the record progresses, the patient initially is in the waking state, but appears to enter the early stage II sleep during the recording, with rudimentary sleep spindles and vertex sharp wave activity seen. During the wakeful state, photic stimulation was performed, and no abnormal responses were seen. Hyperventilation was not performed. Sharp transient seen over the left temporal region.    Abnormality: Mild diffuse slowing  Impression: This is an abnormal EEG recording in the waking and sleeping state due to mild diffuse slowing. This is consistent with a generalized brain dysfunction, such as encephalopathy but nonspecific. There were presence of sharp transients over the left temporal region. Consider ambulatory EEG for further evaluation.    Windell Norfolk, MD [facility]

## 2023-04-21 ENCOUNTER — Other Ambulatory Visit: Payer: Medicare Other | Admitting: *Deleted

## 2023-04-21 ENCOUNTER — Encounter: Payer: Commercial Managed Care - PPO | Admitting: Neurology

## 2023-04-27 ENCOUNTER — Telehealth: Payer: Self-pay

## 2023-04-27 DIAGNOSIS — R9431 Abnormal electrocardiogram [ECG] [EKG]: Secondary | ICD-10-CM

## 2023-04-27 NOTE — Telephone Encounter (Signed)
-----   Message from Anson Fret sent at 04/20/2023  7:24 PM EST ----- Please call daughter. No seizures seen but we did see slowing (likely from memory loss) and a few "sharps" in the left temporal lobe which may or may not be significant. We recommend an ambulatory EEG for 3 days at home to monitor further. Let me know so I can order it thank you.

## 2023-04-27 NOTE — Telephone Encounter (Addendum)
EEG faxed to  [facility]

## 2023-04-27 NOTE — Telephone Encounter (Signed)
Contacted pt daughter regarding EEG. Advised No seizures seen but we did see slowing (likely from memory loss) and a few "sharps" in the left temporal lobe which may or may not be significant. MD recommend an ambulatory EEG for 3 days at home to monitor further. She did agree to 3 day EEG. She also asked about medication changes she called about few weeks ago. I informed her it looks like we called her twice and left a message but doesn't look like she called back. Dr Lucia Gaskins suggested we would address this first prior to any zoloft increases thanks. She can just stop the aricept. She denied her having any chest pain or shortness of breath or lightheadedness. She did mention she was having dizziness but PCP order PT/OT at home. She agreed to repeat the EKG. She verbally understood results and will stop Aricept today, keep Zoloft the same.  Will order 3 day EEG and EKG.

## 2023-04-29 ENCOUNTER — Other Ambulatory Visit: Payer: Self-pay | Admitting: Podiatry

## 2023-05-09 ENCOUNTER — Encounter: Payer: Self-pay | Admitting: Neurology

## 2023-05-09 ENCOUNTER — Ambulatory Visit (INDEPENDENT_AMBULATORY_CARE_PROVIDER_SITE_OTHER): Payer: Medicare Other | Admitting: Neurology

## 2023-05-09 VITALS — BP 136/88 | HR 84 | Ht 64.0 in | Wt 132.0 lb

## 2023-05-09 DIAGNOSIS — R55 Syncope and collapse: Secondary | ICD-10-CM

## 2023-05-09 DIAGNOSIS — R410 Disorientation, unspecified: Secondary | ICD-10-CM

## 2023-05-09 DIAGNOSIS — R072 Precordial pain: Secondary | ICD-10-CM

## 2023-05-09 DIAGNOSIS — I4581 Long QT syndrome: Secondary | ICD-10-CM

## 2023-05-09 DIAGNOSIS — R42 Dizziness and giddiness: Secondary | ICD-10-CM

## 2023-05-09 DIAGNOSIS — G309 Alzheimer's disease, unspecified: Secondary | ICD-10-CM

## 2023-05-09 DIAGNOSIS — F03A2 Unspecified dementia, mild, with psychotic disturbance: Secondary | ICD-10-CM

## 2023-05-09 DIAGNOSIS — F22 Delusional disorders: Secondary | ICD-10-CM

## 2023-05-09 DIAGNOSIS — I672 Cerebral atherosclerosis: Secondary | ICD-10-CM

## 2023-05-09 DIAGNOSIS — R7309 Other abnormal glucose: Secondary | ICD-10-CM

## 2023-05-09 MED ORDER — NITROGLYCERIN 0.4 MG SL SUBL
0.4000 mg | SUBLINGUAL_TABLET | SUBLINGUAL | 3 refills | Status: DC | PRN
Start: 2023-05-09 — End: 2023-08-18

## 2023-05-09 NOTE — Progress Notes (Signed)
[facility]    Provider:  Dr Lucia Gaskins Requesting Provider: Jackelyn Poling, DO Primary Care Provider:  Jackelyn Poling, DO  CC:  Dementia and seizures, prolonged qtc.   EEG was abnormal. 03/2023 reviewed ED notes:  " Patient was getting of a car and according to her family who is at her side patient slumped to the ground but did not suffer any injury.  They noted that she had no change in her respirations.  No change in color of her face.  States that she had some shaking her eyes were open.  For between 5 and 10 minutes she would not respond to the voice.  Patient slowly came back to normal.". EKG 10/12 QTC 503. There is no SSRI that does not cause prolonged QTc. Needs to see cardiology. Has not had another episode. No more dizziness. She gets dizzy when standing.     Addendum: UTI came back positive and we treated with cipro. Also She does not have an alzheimer's gene but she did have markers in her blood that can be consistent with alzheimer's disease. The next step would be to perform a PET Amyloid (in addition to the MRI brain) to see if she has the alzheimer's protein in the brain which, if positive, makes alzheimer's the likely conclusion. Ask if they would like this thanks - asked nurse to call daughter Dr Lucia Gaskins 12/09/2022  HPI: 12/01/2022:  Lanitra Dalley is a 89 y.o. female here as requested by Jackelyn Poling, DO for memory. has Precordial chest pain; Chest pain at rest, negative MI, negative stress test, may have been GI; Essential hypertension; DOE (dyspnea on exertion), may be due to HTN; Lumbar radiculopathy; Chest pain; History of chest pain; and Aortic atherosclerosis (HCC) on their problem list.  She also has hypertension, left leg pain, gait disturbance, hyperlipidemia, Alzheimer's disease, anxiety, insomnia, postpolio syndrome, dysphagia, osteoporosis, asthma, chronic kidney disease arthritis and pain and headaches on her problem list when reviewing primary care notes.   Saw patient back in 2019 for similar symptoms.  She does have a history of dementia with psychiatric disturbance.  She has been on sertraline for a long time but low-dose.  I reviewed my notes from 2019, when we saw her she was already having paranoid delusions that someone is breaking into her apartment and moving things, her daughter had placed cameras in and around the house and did not see anybody.  She is here again with her daughter.  She has been having memory problems for many years.  It is more short-term memory and progressive.  She lives in senior living situation.  She has not been driving for years.  Ongoing and progressive since at least 2017.  She does have a history of dementia in her maternal aunt.  Remote memory is still okay but progressive short-term memory and behavioral symptoms.  Daughter provides most info. No falls.   Patient complains of symptoms per HPI as well as the following symptoms: Dementia. Pertinent negatives and positives per HPI. All others negative     HPI 08/04/2017:  Brianda Krippner is a 89 y.o. female here as requested by Jackelyn Poling, DO for memory. has Precordial chest pain; Chest pain at rest, negative MI, negative stress test, may have been GI; Essential hypertension; DOE (dyspnea on exertion), may be due to HTN; Lumbar radiculopathy; Chest pain; History of chest pain; and Aortic atherosclerosis (HCC) on their problem list.  I reviewed primary care notes, problem list includes hypertension, left leg pain, gait  disturbance, hyperlipidemia, lumbar disc, Alzheimer's disease, left leg weakness, anxiety, insomnia, PMR, GERD, postpolio syndrome, dysphagia, osteoporosis, mild persistent asthma with exacerbation, stage IIIa chronic kidney disease, arthritis and pain, headaches.  She was sent here today for dementia with psychiatric disturbance, they started sertraline at last appointment, having paranoid delusions that someone was breaking into her apartment and moving  things, her daughter placed cameras in the apartment and no one was entering but the patient, explained she thinks the tremors are defective and not seeing the person or the person is able to sneak around without the camera picking them up.  This has not changed since starting the sertraline.  Note states she had a 14 out of 30 on the [facility] cognitive assessment test.  She had a CT in the middle of 2022 for intermittent confusion which did not show any acute abnormalities.  No focal deficits on physical exam.  We have seen her in the past for other chief complaints but not for over 5 years.  Last labs taken were 02/17/2022 and her CMP appeared unremarkable with BUN 21 and creatinine 1.08, which showed a slight decrease in GFR of 50, her CBC was unremarkable,  She is here with daugher who provides much information. Patient says she is having memory problems. Patient says it is "not everything but some things". Daughter says she is having delusions, hallucinations, people are stealing from her, short-term memory loss, she lives in the carilon, independent living and senior "dorm", she doesn't like to [employer] or go out, she has had some wieght loss, family checks up all the time her daughter and husband, repeating same things over and over again, she does sometimes remember appointments, she loses things, no driving for years, she does not wander at night but she is up a lot wandering she naps a lot. More isolation and not wanting to leave apartment. Started a year ago or two and progressively worsening. Maternal aunt had dementia. Remote memory is good.  No inciting event such as no head trauma or new medications when symptoms started and progressed.  No parkinsonian movement disorders present.  No other associated symptoms or focal neurologic deficits.  Reviewed notes, labs and imaging from outside physicians, which showed:   CT Head 12/2020: IMPRESSION: 1. No evidence of acute intracranial abnormality. 2.  Mild-to-moderate chronic small vessel ischemic disease.  Recent Results (from the past 2160 hour(s))  CBC with Differential/Platelet     Status: None   Collection Time: 03/12/23  1:43 PM  Result Value Ref Range   WBC 5.6 4.0 - 10.5 K/uL   RBC 4.29 3.87 - 5.11 MIL/uL   Hemoglobin 12.0 12.0 - 15.0 g/dL   HCT 13.0 86.5 - 78.4 %   MCV 89.3 80.0 - 100.0 fL   MCH 28.0 26.0 - 34.0 pg   MCHC 31.3 30.0 - 36.0 g/dL   RDW 69.6 29.5 - 28.4 %   Platelets 244 150 - 400 K/uL   nRBC 0.0 0.0 - 0.2 %   Neutrophils Relative % 75 %   Neutro Abs 4.3 1.7 - 7.7 K/uL   Lymphocytes Relative 17 %   Lymphs Abs 1.0 0.7 - 4.0 K/uL   Monocytes Relative 6 %   Monocytes Absolute 0.3 0.1 - 1.0 K/uL   Eosinophils Relative 1 %   Eosinophils Absolute 0.0 0.0 - 0.5 K/uL   Basophils Relative 1 %   Basophils Absolute 0.0 0.0 - 0.1 K/uL   Immature Granulocytes 0 %   Abs Immature  Granulocytes 0.01 0.00 - 0.07 K/uL    Comment: Performed at [facility], [address], [place], [place] [address]  Comprehensive metabolic panel     Status: Abnormal   Collection Time: 03/12/23  1:43 PM  Result Value Ref Range   Sodium 139 135 - 145 mmol/L   Potassium 3.2 (L) 3.5 - 5.1 mmol/L   Chloride 105 98 - 111 mmol/L   CO2 26 22 - 32 mmol/L   Glucose, Bld 102 (H) 70 - 99 mg/dL    Comment: Glucose reference range applies only to samples taken after fasting for at least 8 hours.   BUN 19 8 - 23 mg/dL   Creatinine, Ser 6.04 0.44 - 1.00 mg/dL   Calcium 9.0 8.9 - 54.0 mg/dL   Total Protein 7.9 6.5 - 8.1 g/dL   Albumin 4.2 3.5 - 5.0 g/dL   AST 22 15 - 41 U/L   ALT 16 0 - 44 U/L   Alkaline Phosphatase 57 38 - 126 U/L   Total Bilirubin 0.7 0.3 - 1.2 mg/dL   GFR, Estimated 56 (L) >60 mL/min    Comment: (NOTE) Calculated using the CKD-EPI Creatinine Equation (2021)    Anion gap 8 5 - 15    Comment: Performed at [facility], [address] [place], [place] [address]  Urinalysis, Routine w  reflex microscopic -Urine, Clean Catch     Status: Abnormal   Collection Time: 03/12/23  2:10 PM  Result Value Ref Range   Color, Urine YELLOW YELLOW   APPearance CLEAR CLEAR   Specific Gravity, Urine 1.023 1.005 - 1.030   pH 5.0 5.0 - 8.0   Glucose, UA NEGATIVE NEGATIVE mg/dL   Hgb urine dipstick SMALL (A) NEGATIVE   Bilirubin Urine NEGATIVE NEGATIVE   Ketones, ur 5 (A) NEGATIVE mg/dL   Protein, ur NEGATIVE NEGATIVE mg/dL   Nitrite POSITIVE (A) NEGATIVE   Leukocytes,Ua TRACE (A) NEGATIVE   RBC / HPF 0-5 0 - 5 RBC/hpf   WBC, UA 6-10 0 - 5 WBC/hpf   Bacteria, UA MANY (A) NONE SEEN   Squamous Epithelial / HPF 0-5 0 - 5 /HPF   Mucus PRESENT    Hyaline Casts, UA PRESENT     Comment: Performed at [facility], [address] [place], [place] [address]         MRI brain 2019:  IMPRESSION: Abnormal MRI scan of the brain showing remote age lacunar infarcts in both corona radiata and mild age-appropriate changes of chronic microvascular ischemia.  Incidental changes of mild chronic paranasal sinusitis and hypoplasia of the posterior circulation vessels is noted.  No acute abnormality is seen  CT head: 12/2020  Brain: There is no evidence of an acute infarct, intracranial hemorrhage, mass, midline shift, or extra-axial fluid collection. Patchy hypodensities in the cerebral white matter bilaterally are nonspecific but compatible with mild-to-moderate chronic small vessel ischemic disease. 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   Vascular: Calcified atherosclerosis at the skull base. No hyperdense vessel.   Skull: No fracture or suspicious osseous lesion.   Sinuses/Orbits: Visualized paranasal sinuses and mastoid air cells are clear. Bilateral  cataract extraction.   Other: None.   IMPRESSION: 1. No evidence of acute intracranial abnormality. 2. Mild-to-moderate chronic small vessel ischemic disease.  Review of Systems: Patient complains of symptoms per HPI as well as the following symptoms progressive memory decline. Pertinent negatives and positives per HPI. All others negative.   Social History   Socioeconomic History   Marital status: Divorced    Spouse name: Not on file   Number of children: 6   Years of education: 12   Highest education level: Not on file  Occupational History   Not on file  Tobacco Use   Smoking status: Never   Smokeless tobacco: Never  Vaping Use   Vaping status: Never Used  Substance and Sexual Activity   Alcohol use: Not Currently   Drug use: No   Sexual activity: Not on file  Other Topics Concern   Not on file  Social History Narrative   Lives at home alone   Right handed   1 cup of caffeine daily   Social Determinants of Health   Financial Resource Strain: Not on file  Food Insecurity: Not on file  Transportation Needs: Not on file  Physical Activity: Not on file  Stress: Not on file  Social Connections: Not on file  Intimate Partner Violence: Not on file    Family History  Problem Relation Age of Onset   Diabetes Mother    Heart disease Mother    Hypertension Mother    Prostate cancer Father     Past Medical History:  Diagnosis Date   Chest pain at rest, negative MI, negative stress test, may have been GI 12/06/2013   Diabetes mellitus without complication (HCC)    DOE (dyspnea on exertion), may be due to HTN 12/06/2013   GERD (gastroesophageal reflux disease)    H/O diabetes mellitus    HTN (hypertension) 12/06/2013   Hypercholesteremia    Hypertension    TIA (transient ischemic attack)     Patient Active Problem List   Diagnosis Date Noted   Aortic atherosclerosis (HCC) 03/18/2020   History of chest pain 03/19/2019   Chest pain 11/08/2017   Lumbar  radiculopathy 05/02/2017   Precordial chest pain 12/06/2013   Chest pain at rest, negative MI, negative stress test, may have been GI 12/06/2013   Essential hypertension 12/06/2013   DOE (dyspnea on exertion), may be due to HTN 12/06/2013    Past Surgical History:  Procedure Laterality Date   CATARACT EXTRACTION, BILATERAL  2018   EYE SURGERY     FOOT SURGERY      Current Outpatient Medications  Medication Sig Dispense Refill   albuterol (PROVENTIL HFA;VENTOLIN HFA) 108 (90 Base) MCG/ACT inhaler Inhale 2 puffs into the lungs every 6 (six) hours as needed for wheezing or shortness of breath.     Albuterol Sulfate 2.5 MG/0.5ML NEBU as directed.     atorvastatin (LIPITOR) 20 MG tablet Take 20 mg by mouth daily.     fluticasone (FLONASE) 50 MCG/ACT nasal spray Place 1 spray into both nostrils daily as needed.     meloxicam (MOBIC) 7.5 MG tablet TAKE 1 TABLET BY MOUTH EVERY DAY 15 tablet 0   OVER THE COUNTER MEDICATION Aller-tec 10 mg     sertraline (ZOLOFT) 100 MG tablet Take 1 tablet (100 mg total) by mouth at bedtime. (Patient taking differently: Take 100 mg by mouth at bedtime. Takes 50 mg a day) 90 tablet 4   triamterene-hydrochlorothiazide (MAXZIDE-25) 37.5-25 MG tablet Take 1 tablet by mouth daily. Take 1/2 tablet daily     nitroGLYCERIN (NITROSTAT) 0.4 MG SL tablet Place 1 tablet (0.4 mg total) under  the tongue every 5 (five) minutes as needed for chest pain. 25 tablet 3   No current facility-administered medications for this visit.    Allergies as of 05/09/2023 - Review Complete 05/09/2023  Allergen Reaction Noted   Tomato Swelling and Rash 12/06/2013    Vitals: BP 136/88   Pulse 84   Ht 5\' 4"  (1.626 m)   Wt 132 lb (59.9 kg)   BMI 22.66 kg/m  Last Weight:  Wt Readings from Last 1 Encounters:  05/09/23 132 lb (59.9 kg)   Last Height:   Ht Readings from Last 1 Encounters:  05/09/23 5\' 4"  (1.626 m)     Physical exam: Exam: Gen: NAD, conversant, well nourised,  obese, well groomed                     CV: RRR, no MRG. No Carotid Bruits. No peripheral edema, warm, nontender Eyes: Conjunctivae clear without exudates or hemorrhage  Neuro: Detailed Neurologic Exam  Speech:    Speech is normal; fluent and spontaneous with normal comprehension.  Cognition:     12/01/2022    8:09 AM  MMSE - Mini Mental State Exam  Orientation to time 3  Orientation to Place 5  Registration 3  Attention/ Calculation 1  Recall 1  Language- name 2 objects 2  Language- repeat 1  Language- follow 3 step command 3  Language- read & follow direction 1  Write a sentence 1  Copy design 0  Total score 21       The patient is oriented to person, place, and time;     recent and remote memory intact;     language fluent;     normal attention, concentration,     fund of knowledge Cranial Nerves:    The pupils are equal, round, and reactive to light. The fundi are normal and spontaneous venous pulsations are present. Visual fields are full to finger confrontation. Extraocular movements are intact. Trigeminal sensation is intact and the muscles of mastication are normal. The face is symmetric. The palate elevates in the midline. Hearing intact. Voice is normal. Shoulder shrug is normal. The tongue has normal motion without fasciculations.   Coordination:    Normal finger to nose and heel to shin. Normal rapid alternating movements.   Gait:    Heel-toe and tandem gait are normal.   Motor Observation:    No asymmetry, no atrophy, and no involuntary movements noted. Tone:    Normal muscle tone.    Posture:    Posture is normal. normal erect    Strength:    Strength is V/V in the upper and lower limbs.      Sensation: intact to LT     Reflex Exam:  DTR's:    Deep tendon reflexes in the upper and lower extremities are normal bilaterally.   Toes:    The toes are downgoing bilaterally.   Clonus:    Clonus is absent.    Assessment/Plan:  Jacqueline Foley is  a 89 y.o. female here as requested by Jackelyn Poling, DO for dementia, syncope vs seizure, dizziness on standing . has Precordial chest pain; Chest pain at rest, negative MI, negative stress test, may have been GI; Essential hypertension; DOE (dyspnea on exertion), may be due to HTN; Lumbar radiculopathy; Chest pain; History of chest pain; and Aortic atherosclerosis (HCC) on their problem list.  She also has hypertension, left leg pain, gait disturbance, hyperlipidemia, Alzheimer's disease, anxiety, insomnia, postpolio syndrome, dysphagia, osteoporosis, asthma,  chronic kidney disease arthritis and pain and headaches on her problem list when reviewing primary care notes.  Saw patient back in 2019 for similar symptoms.  She does have a history of dementia with psychiatric disturbance.  She has been on sertraline for a long time but low-dose.  I reviewed my notes from 2019, when we saw her she was already having paranoid delusions that someone is breaking into her apartment and moving things, her daughter had placed cameras in and around the house and did not see anybody.  She is here again with her daughter.  She has been having memory problems for many years.  It is more short-term memory and progressive.  She lives in senior living situation.  She has not been driving for years.  Ongoing and progressive since at least 2017.  She does have a history of dementia in her maternal aunt.  Remote memory is still okay but progressive short-term memory and behavioral symptoms.  Daughter provides most info. No falls.   PLAN:  See cardiology for the dizziness also long qtc, recent syncopal event (vs seizure?). Not orthostatic in the office today: laying 164/100 93% P 80 and standing 170/102 97% 91 HR 2. EEG showed abnormality then had a syncopal,  event again cardiac vs seizure? Follow up with cardiology, 3-day ambulatory EEG and if no cardiac reason for syncopal event and we still see the sharp temporal waves would treat  with 250mg  Keppra bid 3. Would stay off aricept or anything that can prolong QTc,. Consult with cardiology for  long QTC syncope and dizziness as well as about the the continued use of SSRI in the setting of long qt 4. Workup showed amyloid beta plaques in the brain which is alzheimer's pathology  Orders Placed This Encounter  Procedures   Ambulatory referral to Cardiology     WORKUP TO DATE:  - MRI brain: nothing acute  - Routine EEG: Impression: This is an abnormal EEG recording in the waking and sleeping state due to mild diffuse slowing. This is consistent with a generalized brain dysfunction, such as encephalopathy but nonspecific. There were presence of sharp transients over the left temporal region. Consider ambulatory EEG for further evaluation.   - EMG/NCS: Conclusion: This is an abnormal study. There is electrophysiologic evidence of moderately-severe right Carpal Tunnel Syndrome and left mild Carpal Tunnel Syndrome.  No suggestion of polyneuropathy  or radiculopathy.   - Stopped aricept (dizziness) and decreased zoloft may need to stop zoloft QTc 503, see cardiology  - Bloodwork and urine last appt  Had a UTI, treated with cipro bc of low CrCl  - Memory loss: She does not have an alzheimer's gene but she did have markers in her blood that can be consistent with alzheimer's disease. PET Amyloid: IMPRESSION: Favor positive scan for beta amyloid deposition with loss of gray differentiation particular in the frontal lobes.   Orders Placed This Encounter  Procedures   Ambulatory referral to Cardiology   Meds ordered this encounter  Medications   nitroGLYCERIN (NITROSTAT) 0.4 MG SL tablet    Sig: Place 1 tablet (0.4 mg total) under the tongue every 5 (five) minutes as needed for chest pain.    Dispense:  25 tablet    Refill:  3    Cc: Jackelyn Poling, DO,  Jackelyn Poling, DO  Naomie Dean, MD  [facility] [address] [place], [place]  [address]  Phone [phone] Fax [phone]  I spent over 40 minutes of face-to-face  and non-face-to-face time with patient on the  1. Syncope, unspecified syncope type   2. Long Q-T syndrome   3. Dizziness   4. Mild dementia with psychotic disturbance, unspecified dementia type (HCC)   5. Confusion   6. Delusion (HCC)   7. Cerebral atherosclerosis   8. Alzheimer's disease, unspecified (CODE) (HCC)   9. Elevated glucose   10. Precordial pain    diagnosis.  This included previsit chart review, lab review, study review, order entry, electronic health record documentation, patient education on the different diagnostic and therapeutic options, counseling and coordination of care, risks and benefits of management, compliance, or risk factor reduction

## 2023-05-09 NOTE — Patient Instructions (Addendum)
PLAN:  See cardiology for the dizziness also long qtc, recent syncopal event (vs seizure?). Not orthostatic in the office today: laying 164/100 93% P 80 and standing 170/102 97% 91 HR 2. EEG showed abnormality then had a syncopal,  event again cardiac vs seizure? Follow up with cardiology, 3-day ambulatory EEG and if no cardiac reason for syncopal event and we still see the sharp temporal waves would treat with 250mg  Keppra bid 3. Would stay off aricept or anything that can prolong QTc,. Consult with cardiology for  long QTC syncope and dizziness as well as about the the continued use of SSRI in the setting of long qtc 4. 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 This is caused by an abnormal electrical system in the heart. LQTS can upset the timing of your heartbeats. It can cause dangerous changes in your heart rate and rhythm (arrhythmia) that can lead to cardiac arrest. You can be born with LQTS, or you can develop it later in life. What are the causes? The cause of this condition depends on the type of LQTS that you have. Inherited LQTS. You are born with this condition. It is caused by an abnormal gene that is passed down through your family. Acquired LQTS. You get this condition later in life. It may be caused by: Certain medicines that can affect your heartbeat, like antibiotics and antidepressants. An electrolyte imbalance. Long periods of vomiting or diarrhea. A thyroid disorder. An eating disorder. What increases the risk? This condition is more likely to develop in: Women. People who have an eating disorder, such as anorexia nervosa or bulimia. People who have a family member with LQTS. People who have a family history of unexplained fainting, drowning, or sudden death. What are the signs or  symptoms? Symptoms of this condition include: Fainting. A fluttering feeling in your chest. Seizures. Symptoms of inherited LQTS almost always start before age 108.  Some people with this condition have no symptoms. How is this diagnosed? This condition may be diagnosed based on: Your symptoms. Your medical history and family history. A physical exam. Some tests, including: An electrocardiogram (ECG) to measure electrical activity in your heart. Holter monitoring to record your heartbeat for 1-2 days. A stress test to record your heartbeat while you exercise. A blood test to look for genes that cause LQTS. How is this treated? There is no cure for this condition. Treatment depends on the cause, your symptoms, and whether you have a family history of sudden death. Treatment may include: Making lifestyle changes, such as avoiding competitive sports. Taking supplements to correct abnormal sodium, potassium, calcium, and magnesium levels. Avoiding swimming and diving. Taking heart medicines, such as beta blockers. Having a device implanted that corrects a dangerous heartbeat called a cardioverter-defibrillator (ICD). This senses a fast heartbeat and shocks the heart to restore normal heart rate. Having heart surgery to prevent arrhythmias. Follow these instructions at home: Medicines Take over-the-counter and prescription medicines only as told by your health care provider. Before taking any new medicine, get approval from your health care provider first. Avoid any medicines that can cause this condition. Lifestyle Make lifestyle changes recommended by your health care provider. You may need to avoid: Stressful situations. Situations where sudden loud noises are likely. If you drink alcohol: Limit how much you have to:  0-1 drink a day for women who are not pregnant. 0-2 drinks a day for men. Know how much alcohol is in a drink. In the [place] one drink equals one 12 oz bottle of beer  (355 mL), one 5 oz glass of wine (148 mL), or one 1 oz glass of hard liquor (44 mL). Do not use any products that contain nicotine or tobacco. These products include cigarettes, chewing tobacco, and vaping devices, such as e-cigarettes. If you need help quitting, ask your health care provider. General instructions Develop a plan with your health care provider for how to deal with a sudden arrhythmia. Tell people who live with you about the signs of a sudden arrhythmia. Wear a medical ID necklace or bracelet that states your diagnosis and contact information. Have an automated external defibrillator (AED) available at home or work. Get treatment and support if you feel stress, fear, anxiety, or depression. Keep all follow-up visits. This is important. Contact a health care provider if: You are suffering from stress, fear, anxiety, or depression. You have long periods of vomiting or diarrhea. Get help right away if: You have chest pain or difficulty breathing. You have a fluttering feeling in your chest. You faint. You have a seizure. These symptoms may be an emergency. Get help right away. Call 911. Do not wait to see if the symptoms will go away. Do not drive yourself to the hospital. Summary Long QT syndrome (LQTS) is a heart condition in which your heart takes longer than normal to recharge after each heartbeat. This is caused by an abnormal electrical system in your heart. LQTS can upset the timing of your heartbeats and cause dangerous changes in your heart rate and rhythm. Some people are born with LQTS. Others develop it later in life. Some people with this condition have no symptoms. Those who do have symptoms may experience fainting, a fluttering feeling in the chest, or seizures. There is no cure for this condition. Treatment depends on the cause, your symptoms, and whether you have a family history of sudden death. This information is not intended to replace advice given to you  by your health care provider. Make sure you discuss any questions you have with your health care provider. Document Revised: 04/16/2021 Document Reviewed: 04/16/2021 Elsevier Patient Education  2024 [employer].

## 2023-06-08 NOTE — Progress Notes (Signed)
 Cardiology Office Note    Date:  06/10/2023  ID:  Jacqueline Foley, DOB 1932/10/20, MRN [id number] PCP:  Jacqueline Motto, DO  Cardiologist:  Jacqueline Bruckner, MD  Electrophysiologist:  None   Chief Complaint: Syncopal episode   History of Present Illness: .    Jacqueline Foley is a 89 y.o. female with visit-pertinent history of chest pain, diabetes type 2, hypertension, GERD, TIA, hyperlipidemia, dementia.  First evaluated by Dr. Bruckner in 01/2018 for evaluation and management of chest pain.  Was previously seen at [facility] and then moved to [place]  for 5 or 6 years.  She was seen by cardiologist while living in [place]  and was started on Lasix, unclear if she had other testing done at that time.  In 10/2017 she developed intermittent left chest discomfort, pressure at rest and with exertion that improved with nitroglycerin .  She presented to the ER and has been admitted for observation, her troponins were negative x 3, echo was normal and nuclear Lexiscan  was low risk.  CT PE was negative following an elevated D-dimer.  She is lasting by Dr. Bruckner on 07/13/2021.  She remained stable from a cardiac standpoint.  She noted some intermittent left-sided chest pain described as a pressure, she felt was related to acid reflux.  She noted pressure mostly occurred at night and improved with propping her head up on pillows.Patient was then lost to follow up.   On 03/12/2023 patient presented to the ED following a seizure/questionable syncopal event.  Per notes patient was getting out of a car and according to her family patient slumped to the ground.  They denied any change in respirations or color change in her face.  Reported that she had some shaking and her eyes were open.  Forth between 5 and 10 minutes she would not respond to voice and slowly came back to normal.  On presentation to the emergency room she was back to her normal baseline.  In the ED her EKG showed no signs of acute ischemic  changes, QTc noted at 503.  She had mild hypokalemia noted and was given oral potassium.  CT without any acute findings.  Her UA was positive for infection, she was treated for UTI.  She is not orthostatic on exam, she was discharged with recommendation to follow-up with her neurologist.  Today she presents for follow up with her daughter. She reports that she is doing well overall.  She is currently living in a senior living complex, currently lives alone although her daughter is frequently over to help her.  Patient's daughter answers majority of questions, although patient denies any recent chest pain.  She does note she had some acid reflux-like pain that woke her from sleep and was resolved with drinking water , no further recurrence.  Patient's daughter describes her syncopal/?seizure like activity event in October.  She reports that the patient was sitting in the car, proceeded to get out of car and start walking then became unresponsive and slightly slumped over.  She notes that her eyes were open and she was slightly shaking and nonresponsive for around 5 minutes.  ED workup as above.  Patient denies any recurrence and patient's daughter denies any recurrence that she is aware of.  Patient does note she is occasionally dizzy when she first stands up from sitting, she has had orthostatic vitals completed at the ED and last month at her neurologist which were negative.  Patient denies any palpitations, shortness of breath, she does report some  occasional lower extremity edema if she has been on her feet a good bit.  ROS: .   Today she denies shortness of breath, lower extremity edema, fatigue, palpitations, melena, hematuria, hemoptysis, diaphoresis, weakness, presyncope, orthopnea, and PND.  All other systems are reviewed and otherwise negative. Studies Reviewed: Jacqueline Foley    EKG:  EKG is ordered today, personally reviewed, demonstrating  EKG Interpretation Date/Time:  Friday June 10 2023 08:19:58  EST Ventricular Rate:  86 PR Interval:  132 QRS Duration:  94 QT Interval:  384 QTC Calculation: 459 R Axis:   -24  Text Interpretation: Normal sinus rhythm Moderate voltage criteria for LVH, may be normal variant ( R in aVL , Cornell product ) EARLY R WAVE PROGRESSION No significant change from prior Confirmed by Jacqueline Foley [phone]) on 06/10/2023 8:23:46 AM   CV Studies:  Cardiac Studies & Procedures     STRESS TESTS  MYOCARDIAL PERFUSION IMAGING 08/29/2020  Narrative  The left ventricular ejection fraction is mildly decreased (45-54%).  Nuclear stress EF: 52%.  There was no ST segment deviation noted during stress.  No T wave inversion was noted during stress.  The study is normal.  This is a low risk study.  ECHOCARDIOGRAM  ECHOCARDIOGRAM COMPLETE 11/08/2017  Narrative *[place]* *[facility]* [address] [place], [place] [address] [phone]  ------------------------------------------------------------------- Transthoracic Echocardiography  Patient:    Jacqueline, Foley MR #:       [id number] Study Date: 11/08/2017 Gender:     F Age:        56 Height:     162.6 cm Weight:     63.5 kg BSA:        1.7 m^2 Pt. Status: Room:       6E20C  ATTENDING    Jacqueline Foley REFERRING    Jacqueline Foley PERFORMING   Jacqueline Foley, Inpatient SONOGRAPHER  Dance, Tiffany  cc:  ------------------------------------------------------------------- LV EF: 60% -   65%  ------------------------------------------------------------------- Indications:      Chest pain 786.51.  ------------------------------------------------------------------- History:   PMH:   Dyspnea.  Transient ischemic attack.  PMH:  GERD. Risk factors:  Hypertension. Diabetes mellitus. Dyslipidemia.  ------------------------------------------------------------------- Study Conclusions  - Left ventricle: The cavity size was normal. Wall  thickness was normal. Systolic function was normal. The estimated ejection fraction was in the range of 60% to 65%. The study is not technically sufficient to allow evaluation of LV diastolic function.  ------------------------------------------------------------------- Study data:   Study status:  Routine.  Procedure:  The patient reported no pain pre or post test. Transthoracic echocardiography. Image quality was adequate.  Study completion:  There were no complications.          Transthoracic echocardiography.  M-mode, complete 2D, spectral Doppler, and color Doppler.  Birthdate: Patient birthdate: January 17, 1933.  Age:  Patient is 89 yr old.  Sex: Gender: female.    BMI: 24 kg/m^2.  Blood pressure:     141/82 Patient status:  Inpatient.  Study date:  Study date: 11/08/2017. Study time: 03:53 PM.  Location:  Emergency department.  -------------------------------------------------------------------  ------------------------------------------------------------------- Left ventricle:  The cavity size was normal. Wall thickness was normal. Systolic function was normal. The estimated ejection fraction was in the range of 60% to 65%. The study is not technically sufficient to allow evaluation of LV diastolic function.  ------------------------------------------------------------------- Aortic valve:   Mildly thickened, mildly calcified leaflets. Doppler:  There was no significant regurgitation.  ------------------------------------------------------------------- Mitral  valve:   Calcified annulus. Mildly thickened leaflets . Doppler:  There was trivial regurgitation.  ------------------------------------------------------------------- Left atrium:  The atrium was normal in size.  ------------------------------------------------------------------- Right ventricle:  The cavity size was normal. Wall thickness was normal. Systolic function was  normal.  ------------------------------------------------------------------- Pulmonic valve:   Poorly visualized.  Doppler:  There was no significant regurgitation.  ------------------------------------------------------------------- Tricuspid valve:   Structurally normal valve.   Leaflet separation was normal.  Doppler:  Transvalvular velocity was within the normal range. There was mild regurgitation.  ------------------------------------------------------------------- Right atrium:  The atrium was normal in size.  ------------------------------------------------------------------- Pericardium:  There was no pericardial effusion.  ------------------------------------------------------------------- Measurements  Left ventricle                          Value        Reference LV ID, ED, PLAX chordal         (L)     36.3  mm     43 - 52 LV ID, ES, PLAX chordal                 27.9  mm     23 - 38 LV fx shortening, PLAX chordal  (L)     23    %      >=29 LV PW thickness, ED                     8.87  mm     ---------- IVS/LV PW ratio, ED                     1.24         <=1.3 LV e&', lateral                          8.05  cm/s   ---------- LV E/e&', lateral                        8.2          ---------- LV e&', medial                           4.9   cm/s   ---------- LV E/e&', medial                         13.47        ---------- LV e&', average                          6.48  cm/s   ---------- LV E/e&', average                        10.19        ----------  Ventricular septum                      Value        Reference IVS thickness, ED                       11    mm     ----------  LVOT  Value        Reference LVOT ID, S                              19    mm     ---------- LVOT area                               2.84  cm^2   ----------  Aorta                                   Value        Reference Aortic root ID, ED                      29     mm     ---------- Ascending aorta ID, A-P, S              31    mm     ----------  Left atrium                             Value        Reference LA ID, A-P, ES                          30    mm     ---------- LA ID/bsa, A-P                          1.76  cm/m^2 <=2.2 LA volume, S                            40.8  ml     ---------- LA volume/bsa, S                        24    ml/m^2 ---------- LA volume, ES, 1-p A4C                  36    ml     ---------- LA volume/bsa, ES, 1-p A4C              21.1  ml/m^2 ---------- LA volume, ES, 1-p A2C                  45.4  ml     ---------- LA volume/bsa, ES, 1-p A2C              26.7  ml/m^2 ----------  Mitral valve                            Value        Reference Mitral E-wave peak velocity             66    cm/s   ---------- Mitral A-wave peak velocity             103   cm/s   ---------- Mitral deceleration time        (H)     282   ms     150 - 230 Mitral E/A ratio,  peak                  0.6          ----------  Tricuspid valve                         Value        Reference Tricuspid regurg peak velocity          268   cm/s   ---------- Tricuspid peak RV-RA gradient           29    mm Hg  ----------  Right atrium                            Value        Reference RA ID, S-I, ES, A4C                     42.5  mm     34 - 49 RA area, ES, A4C                        12.4  cm^2   8.3 - 19.5 RA volume, ES, A/L                      29.6  ml     ---------- RA volume/bsa, ES, A/L                  17.4  ml/m^2 ----------  Right ventricle                         Value        Reference RV ID, minor axis, ED, A4C base         27.5  mm     ---------- TAPSE                                   24    mm     ---------- RV s&', lateral, S                       9.57  cm/s   ----------  Legend: (L)  and  (H)  mark values outside specified reference range.  ------------------------------------------------------------------- Prepared and Electronically  Authenticated by  Vina Gull, M.D. 2019-06-11T21:02:16             Current Reported Medications:.    Current Meds  Medication Sig   albuterol  (PROVENTIL  HFA;VENTOLIN  HFA) 108 (90 Base) MCG/ACT inhaler Inhale 2 puffs into the lungs every 6 (six) hours as needed for wheezing or shortness of breath.   Albuterol  Sulfate 2.5 MG/0.5ML NEBU as directed.   atorvastatin  (LIPITOR) 20 MG tablet Take 20 mg by mouth daily.   fluticasone  (FLONASE ) 50 MCG/ACT nasal spray Place 1 spray into both nostrils daily as needed.   meloxicam  (MOBIC ) 7.5 MG tablet TAKE 1 TABLET BY MOUTH EVERY DAY   nitroGLYCERIN  (NITROSTAT ) 0.4 MG SL tablet Place 1 tablet (0.4 mg total) under the tongue every 5 (five) minutes as needed for chest pain.   OVER THE COUNTER MEDICATION Aller-tec 10 mg   sertraline  (ZOLOFT ) 100 MG tablet Take 1 tablet (100 mg total) by mouth at bedtime. (Patient taking differently: Take 50 mg  by mouth at bedtime. Takes 50 mg a day)   triamterene -hydrochlorothiazide  (MAXZIDE -25) 37.5-25 MG tablet Take 1 tablet by mouth daily. Take 1/2 tablet daily    Physical Exam:    VS:  BP 114/78 (BP Location: Left Arm, Patient Position: Sitting, Cuff Size: Normal)   Pulse 86   Ht 5' 4 (1.626 m)   Wt 134 lb (60.8 kg)   BMI 23.00 kg/m    Wt Readings from Last 3 Encounters:  06/10/23 134 lb (60.8 kg)  05/09/23 132 lb (59.9 kg)  04/04/23 122 lb (55.3 kg)    GEN: Well nourished, well developed in no acute distress NECK: No JVD; No carotid bruits CARDIAC: RRR, no murmurs, rubs, gallops RESPIRATORY:  Clear to auscultation without rales, wheezing or rhonchi  ABDOMEN: Soft, non-tender, non-distended EXTREMITIES:  No edema; No acute deformity   Asessement and Plan:Jacqueline Foley    Syncope/seizures: Patient with questionable seizure versus syncope in October.  She denies any further recurrence.  Patient's daughter notes that she had been sitting in the car then stood and started walking, then became unresponsive with  shaking while her eyes were open.  ED workup overall reassuring, although she did have a UTI, her QTc at that point was calculated at 503, patient's daughter notes she may have been on the increased dose of sertraline  at the time but was not on Aricept .  QTc today normalized.  Patient does note some occasional dizziness when going from sitting to standing, orthostatics have previously been negative.  She deferred orthostatics today. Discussed cardiac monitor with patient's and daughter, 30-day monitor was deferred, they are agreeable to a 14-day cardiac monitor.  Will also check echocardiogram.  Patient and daughter notes they would prefer to avoid any invasive procedures unless absolutely necessary.  Reviewed ED precautions, patient is no longer driving.  Hypertension: Blood pressure today 114/78, patient and daughter declined orthostatics, they were negative on ED workup in October and at neurologist last month.  Discussed discontinuing hydrochlorothiazide  as this could be contributing to dehydration, patient notes she does not drink enough water .  Patient and daughter deferred for now would prefer to have echo and cardiac monitor completed prior to medication changes.  Discussed importance of staying well-hydrated.  Chest pain: Patient reports one episode of chest discomfort that awoke her from sleep, she was able to drink some water  with complete resolution and no recurrence.  She notes that this felt like her standard acid reflux which she does have history of per daughter.  She denies any exertional chest pain.  She will continue to monitor.  ED precautions reviewed.  Lower extremity edema: Patient reports history of lower extremity edema, prefers to continue hydrochlorothiazide . No lower extremity edema noted on exam today. Check echocardiogram.     Disposition: F/u with Dr. Lonni or Rayni Nemitz, NP in 6 weeks or sooner if needed.   Signed, Jontue Crumpacker D Kieana Livesay, NP

## 2023-06-10 ENCOUNTER — Ambulatory Visit: Payer: Medicare Other | Attending: Cardiology | Admitting: Cardiology

## 2023-06-10 ENCOUNTER — Encounter: Payer: Self-pay | Admitting: Cardiology

## 2023-06-10 ENCOUNTER — Ambulatory Visit: Payer: Medicare Other | Attending: Cardiology

## 2023-06-10 VITALS — BP 114/78 | HR 86 | Ht 64.0 in | Wt 134.0 lb

## 2023-06-10 DIAGNOSIS — R600 Localized edema: Secondary | ICD-10-CM

## 2023-06-10 DIAGNOSIS — R42 Dizziness and giddiness: Secondary | ICD-10-CM

## 2023-06-10 DIAGNOSIS — I700 Atherosclerosis of aorta: Secondary | ICD-10-CM

## 2023-06-10 DIAGNOSIS — R55 Syncope and collapse: Secondary | ICD-10-CM

## 2023-06-10 DIAGNOSIS — I10 Essential (primary) hypertension: Secondary | ICD-10-CM

## 2023-06-10 DIAGNOSIS — R072 Precordial pain: Secondary | ICD-10-CM | POA: Diagnosis not present

## 2023-06-10 NOTE — Patient Instructions (Addendum)
 Medication Instructions:  No changes *If you need a refill on your cardiac medications before your next appointment, please call your pharmacy*  Lab Work: No labs If you have labs (blood work) drawn today and your tests are completely normal, you will receive your results only by: MyChart Message (if you have MyChart) OR A paper copy in the mail If you have any lab test that is abnormal or we need to change your treatment, we will call you to review the results.  Testing/Procedures: Your physician has requested that you have an echocardiogram. Echocardiography is a painless test that uses sound waves to create images of your heart. It provides your doctor with information about the size and shape of your heart and how well your heart's chambers and valves are working. This procedure takes approximately one hour. There are no restrictions for this procedure. Please do NOT wear cologne, perfume, aftershave, or lotions (deodorant is allowed). Please arrive 15 minutes prior to your appointment time.  Please note: We ask at that you not bring children with you during ultrasound (echo/ vascular) testing. Due to room size and safety concerns, children are not allowed in the ultrasound rooms during exams. Our front office staff cannot provide observation of children in our lobby area while testing is being conducted. An adult accompanying a patient to their appointment will only be allowed in the ultrasound room at the discretion of the ultrasound technician under special circumstances. We apologize for any inconvenience.  SABRAZIO XT- Long Term Monitor Instructions  Your physician has requested you wear a ZIO patch monitor for 14 days.  This is a single patch monitor. [employer] supplies one patch monitor per enrollment. Additional stickers are not available. Please do not apply patch if you will be having a Nuclear Stress Test,  Echocardiogram, Cardiac CT, MRI, or Chest Xray during the period you would be  wearing the  monitor. The patch cannot be worn during these tests. You cannot remove and re-apply the  ZIO XT patch monitor.  Your ZIO patch monitor will be mailed 3 day USPS to your address on file. It may take 3-5 days  to receive your monitor after you have been enrolled.  Once you have received your monitor, please review the enclosed instructions. Your monitor  has already been registered assigning a specific monitor serial # to you.  Billing and Patient Assistance Program Information  We have supplied [employer] with any of your insurance information on file for billing purposes. [employer] offers a sliding scale Patient Assistance Program for patients that do not have  insurance, or whose insurance does not completely cover the cost of the ZIO monitor.  You must apply for the Patient Assistance Program to qualify for this discounted rate.  To apply, please call [employer] at [phone], select option 4, select option 2, ask to apply for  Patient Assistance Program. Meredeth will ask your household income, and how many people  are in your household. They will quote your out-of-pocket cost based on that information.  [employer] will also be able to set up a 14-month, interest-free payment plan if needed.  Applying the monitor   Shave hair from upper left chest.  Hold abrader disc by orange tab. Rub abrader in 40 strokes over the upper left chest as  indicated in your monitor instructions.  Clean area with 4 enclosed alcohol pads. Let dry.  Apply patch as indicated in monitor instructions. Patch will be placed under collarbone on left  side of chest with  arrow pointing upward.  Rub patch adhesive wings for 2 minutes. Remove white label marked 1. Remove the white  label marked 2. Rub patch adhesive wings for 2 additional minutes.  While looking in a mirror, press and release button in center of patch. A small green light will  flash 3-4 times. This will be your only indicator that the  monitor has been turned on.  Do not shower for the first 24 hours. You may shower after the first 24 hours.  Press the button if you feel a symptom. You will hear a small click. Record Date, Time and  Symptom in the Patient Logbook.  When you are ready to remove the patch, follow instructions on the last 2 pages of Patient  Logbook. Stick patch monitor onto the last page of Patient Logbook.  Place Patient Logbook in the blue and white box. Use locking tab on box and tape box closed  securely. The blue and white box has prepaid postage on it. Please place it in the mailbox as  soon as possible. Your physician should have your test results approximately 7 days after the  monitor has been mailed back to [facility].  Call [facility] Customer Care at [phone] if you have questions regarding  your ZIO XT patch monitor. Call them immediately if you see an orange light blinking on your  monitor.  If your monitor falls off in less than 4 days, contact our Monitor department at [phone].  If your monitor becomes loose or falls off after 4 days call [employer] at [phone] for  suggestions on securing your monitor Follow-Up: At [facility], you and your health needs are our priority.  As part of our continuing mission to provide you with exceptional heart care, we have created designated Provider Care Teams.  These Care Teams include your primary Cardiologist (physician) and Advanced Practice Providers (APPs -  Physician Assistants and Nurse Practitioners) who all work together to provide you with the care you need, when you need it.  We recommend signing up for the patient portal called MyChart.  Sign up information is provided on this After Visit Summary.  MyChart is used to connect with patients for Virtual Visits (Telemedicine).  Patients are able to view lab/test results, encounter notes, upcoming appointments, etc.  Non-urgent messages can be sent to your provider as  well.   To learn more about what you can do with MyChart, go to [email or web address].    Your next appointment:   6 week(s)  Provider:   Shelda Bruckner, MD  or Katlyn West, NP  Other Instructions

## 2023-06-10 NOTE — Progress Notes (Unsigned)
 Enrolled patient for a 14 day Zio XT monitor to be mailed to patients home  Jacqueline Foley to read

## 2023-06-27 ENCOUNTER — Other Ambulatory Visit: Payer: Self-pay | Admitting: Neurology

## 2023-06-27 DIAGNOSIS — F03A2 Unspecified dementia, mild, with psychotic disturbance: Secondary | ICD-10-CM

## 2023-06-27 DIAGNOSIS — R7309 Other abnormal glucose: Secondary | ICD-10-CM

## 2023-06-27 DIAGNOSIS — F22 Delusional disorders: Secondary | ICD-10-CM

## 2023-06-27 DIAGNOSIS — I672 Cerebral atherosclerosis: Secondary | ICD-10-CM

## 2023-06-27 DIAGNOSIS — G309 Alzheimer's disease, unspecified: Secondary | ICD-10-CM

## 2023-06-27 DIAGNOSIS — R410 Disorientation, unspecified: Secondary | ICD-10-CM

## 2023-07-08 ENCOUNTER — Ambulatory Visit (HOSPITAL_COMMUNITY)
Admission: RE | Admit: 2023-07-08 | Discharge: 2023-07-08 | Disposition: A | Discharge status: Home / Self Care | Payer: Medicare Other | Source: Ambulatory Visit | Attending: Cardiology | Admitting: Cardiology

## 2023-07-08 DIAGNOSIS — R55 Syncope and collapse: Secondary | ICD-10-CM | POA: Insufficient documentation

## 2023-07-08 DIAGNOSIS — R600 Localized edema: Secondary | ICD-10-CM | POA: Insufficient documentation

## 2023-07-08 DIAGNOSIS — R42 Dizziness and giddiness: Secondary | ICD-10-CM | POA: Diagnosis present

## 2023-07-08 LAB — ECHOCARDIOGRAM COMPLETE
AR max vel: 1.53 cm2
AV Area VTI: 1.82 cm2
AV Area mean vel: 1.57 cm2
AV Mean grad: 5 mm[Hg]
AV Peak grad: 10 mm[Hg]
Ao pk vel: 1.58 m/s
Area-P 1/2: 2.56 cm2
MV M vel: 0.91 m/s
MV Peak grad: 3.3 mm[Hg]
S' Lateral: 1.7 cm

## 2023-07-12 ENCOUNTER — Telehealth: Payer: Self-pay

## 2023-07-12 NOTE — Telephone Encounter (Signed)
-----   Message from Rip Harbour sent at 07/11/2023 11:02 PM EST ----- Please let Jacqueline Foley know that her echocardiogram indicated normal heart squeeze and function, there is some mild stiffening of the left lower chamber the heart that is common with aging and history of hypertension.  There is no significant change compared to her prior echocardiogram.  Results can be further discussed on follow-up with Dr. Cristal Deer on 2/18.

## 2023-07-12 NOTE — Telephone Encounter (Signed)
Left message to call back

## 2023-07-15 NOTE — Telephone Encounter (Signed)
Called patient advised of below they verbalized understanding.

## 2023-07-19 ENCOUNTER — Ambulatory Visit (HOSPITAL_BASED_OUTPATIENT_CLINIC_OR_DEPARTMENT_OTHER): Payer: Medicare Other | Admitting: Cardiology

## 2023-07-19 ENCOUNTER — Encounter (HOSPITAL_BASED_OUTPATIENT_CLINIC_OR_DEPARTMENT_OTHER): Payer: Self-pay | Admitting: Cardiology

## 2023-07-19 VITALS — BP 126/80 | HR 76 | Ht 64.0 in | Wt 136.0 lb

## 2023-07-19 DIAGNOSIS — I700 Atherosclerosis of aorta: Secondary | ICD-10-CM | POA: Diagnosis not present

## 2023-07-19 DIAGNOSIS — I10 Essential (primary) hypertension: Secondary | ICD-10-CM | POA: Diagnosis not present

## 2023-07-19 DIAGNOSIS — R55 Syncope and collapse: Secondary | ICD-10-CM

## 2023-07-19 DIAGNOSIS — R0789 Other chest pain: Secondary | ICD-10-CM

## 2023-07-19 NOTE — Progress Notes (Signed)
Cardiology Office Note:  .   Date:  07/19/2023  ID:  Jacqueline Foley, DOB Dec 21, 1932, MRN [id number] PCP: Jackelyn Poling, DO  [facility] Providers Cardiologist:  Jodelle Red, MD {  History of Present Illness: .   Jacqueline Foley is a 89 y.o. female with a hx of chest pain, diabetes type II, hypertension, GERD, TIA, HLD who is seen for follow up. I initially saw her as a new consult at the request of Ileana Ladd, MD for the evaluation and management of chest pain and recent hospital evaluation on 02/17/18.   Cardiac history: Was seen previously at [facility] (not in Care Everywhere)--worked at [employer] for 26 years, then moved to [place] for 5-6 years ([employer] Snook, [place] [employer] [facility] [facility] [employer] [facility]). She saw a cardiologist in [place] in the [facility] main hospital at that time. She was put on lasix but unsure what other testing was done at the time. No cath. Thinks possibly an echo. Never told there were any issues with her heart. In 10/2017 developed intermittent left chest discomfort, pressure, both at rest and exertional, improved with NG. She presented to the ER and was admitted for observation. Troponins were negative x3, echo was normal, nuclear lexiscan was low risk. CTPE negative (d-dimer was elevated).  Today: Seen by Verneita Griffes 06/10/23. Had a seizure/?syncopal event in October 2024. Echo and monitor ordered. Echo reviewed--unchanged, monitor listed as exam begun but no results available at this time.   Here with family today. Has not had further events. Reviewed echo, which was unremarkable. Pending monitor results.  Following with Dr. Lucia Gaskins as well. Concern with the event was that she was staring/nonresponsive for several minutes during the event. No prodrome. Had confusion after.  Has occasional LLE edema, wears a prosthesis on that leg, mild, worse at the end of the day. Walks around her complex indoors 3-4 times/day.  ROS: Denies chest pain, shortness of breath  at rest or with normal exertion. No PND, orthopnea, or unexpected weight gain. No palpitations. ROS otherwise negative except as noted.   Studies Reviewed: Marland Kitchen    EKG:       Physical Exam:   VS:  BP 126/80 (BP Location: Right Arm, Patient Position: Sitting, Cuff Size: Normal)   Pulse 76   Ht 5\' 4"  (1.626 m)   Wt 136 lb (61.7 kg)   SpO2 99%   BMI 23.34 kg/m    Wt Readings from Last 3 Encounters:  07/19/23 136 lb (61.7 kg)  06/10/23 134 lb (60.8 kg)  05/09/23 132 lb (59.9 kg)    GEN: Well nourished, well developed in no acute distress HEENT: Normal, moist mucous membranes NECK: No JVD CARDIAC: regular rhythm, normal S1 and S2, no rubs or gallops. 1/6 systolic murmur RUSB. VASCULAR: Radial and DP pulses 2+ bilaterally. No carotid bruits RESPIRATORY:  Clear to auscultation without rales, wheezing or rhonchi  ABDOMEN: Soft, non-tender, non-distended MUSCULOSKELETAL:  Ambulates independently, brace on LLE SKIN: Warm and dry, no edema NEUROLOGIC:  Alert and oriented x 3. No focal neuro deficits noted. PSYCHIATRIC:  Normal affect    ASSESSMENT AND PLAN: .    Seizure vs. Syncope 03/2023 -echo unremarkable, we discussed together today -pending monitor results (family is about to mail it back). Per family, no events while she was wearing this.   History of Chest pain:  -intermittent but no recent events. Has treated with acid reducer in the past -stress test low risk -worse at night/laying in bed -counseled on red  flag warning signs that need immediate medical attention   Hypertension: at goal today -continue triamterene-hydrochlorothiazide 37.5mg -25 mg daily   Aortic atherosclerosis, history of TIA:  -continue atorvastatin 20 mg daily -we have discussed aspirin, but given age and potential GI concerns we elected to not start this.   CV risk counseling and secondary prevention -recommend heart healthy/Mediterranean diet, with whole grains, fruits, vegetable, fish, lean meats,  nuts, and olive oil. Limit salt. -recommend moderate walking, 3-5 times/week for 30-50 minutes each session. Aim for at least 150 minutes.week. Goal should be pace of 3 miles/hours, or walking 1.5 miles in 30 minutes -recommend avoidance of tobacco products. Avoid excess alcohol.  Dispo: 1 year or sooner as needed  Signed, Jodelle Red, MD   Jodelle Red, MD, PhD, [facility]  [facility]    Heart & Vascular at [facility] at [facility] [address], [address] [place], [place] [address] [phone]

## 2023-07-19 NOTE — Patient Instructions (Addendum)
Medication Instructions:  Your physician recommends that you continue on your current medications as directed. Please refer to the Current Medication list given to you today.  Follow-Up: At [facility], you and your health needs are our priority.  As part of our continuing mission to provide you with exceptional heart care, we have created designated Provider Care Teams.  These Care Teams include your primary Cardiologist (physician) and Advanced Practice Providers (APPs -  Physician Assistants and Nurse Practitioners) who all work together to provide you with the care you need, when you need it.  We recommend signing up for the patient portal called "MyChart".  Sign up information is provided on this After Visit Summary.  MyChart is used to connect with patients for Virtual Visits (Telemedicine).  Patients are able to view lab/test results, encounter notes, upcoming appointments, etc.  Non-urgent messages can be sent to your provider as well.   To learn more about what you can do with MyChart, go to [email or web address].    Your next appointment:   1 year  Provider:   Jodelle Red, MD

## 2023-08-08 ENCOUNTER — Telehealth: Payer: Self-pay | Admitting: *Deleted

## 2023-08-08 NOTE — Telephone Encounter (Signed)
 Pt's daughter Dennard Nip came by office for a personal appointment, and wanted to know when Dr Lucia Gaskins plans to see the patient back here. The patient has completed the 2 week heart monitor and the results are pending. Cheri states cardiology cleared patient for a year (still awaiting final results). Was she supposed to start on keppra? Do we need another EEG?

## 2023-08-10 NOTE — Telephone Encounter (Signed)
 Schedule follow up first available with dr Lucia Gaskins. Can be virtual. Please let Cherri know this is a 30 minute appointment so we can review her mother's workup and next steps, for 30 minutes.

## 2023-08-10 NOTE — Telephone Encounter (Signed)
 LVM with patient's daughter asking for a call back to schedule first available appointment with Dr. Lucia Gaskins

## 2023-08-13 ENCOUNTER — Encounter (HOSPITAL_COMMUNITY): Payer: Self-pay

## 2023-08-13 ENCOUNTER — Other Ambulatory Visit: Payer: Self-pay

## 2023-08-13 ENCOUNTER — Emergency Department (HOSPITAL_COMMUNITY)

## 2023-08-13 ENCOUNTER — Inpatient Hospital Stay (HOSPITAL_COMMUNITY)
Admission: EM | Admit: 2023-08-13 | Discharge: 2023-08-18 | DRG: 481 | Disposition: A | Discharge status: Skilled Nursing Facility | Attending: Internal Medicine | Admitting: Internal Medicine

## 2023-08-13 DIAGNOSIS — W19XXXA Unspecified fall, initial encounter: Principal | ICD-10-CM

## 2023-08-13 DIAGNOSIS — Z91018 Allergy to other foods: Secondary | ICD-10-CM | POA: Diagnosis not present

## 2023-08-13 DIAGNOSIS — Z9841 Cataract extraction status, right eye: Secondary | ICD-10-CM

## 2023-08-13 DIAGNOSIS — Z7951 Long term (current) use of inhaled steroids: Secondary | ICD-10-CM

## 2023-08-13 DIAGNOSIS — Y92009 Unspecified place in unspecified non-institutional (private) residence as the place of occurrence of the external cause: Secondary | ICD-10-CM

## 2023-08-13 DIAGNOSIS — Z8249 Family history of ischemic heart disease and other diseases of the circulatory system: Secondary | ICD-10-CM | POA: Diagnosis not present

## 2023-08-13 DIAGNOSIS — S72142A Displaced intertrochanteric fracture of left femur, initial encounter for closed fracture: Principal | ICD-10-CM | POA: Diagnosis present

## 2023-08-13 DIAGNOSIS — I10 Essential (primary) hypertension: Secondary | ICD-10-CM | POA: Diagnosis present

## 2023-08-13 DIAGNOSIS — F411 Generalized anxiety disorder: Secondary | ICD-10-CM | POA: Diagnosis present

## 2023-08-13 DIAGNOSIS — R710 Precipitous drop in hematocrit: Secondary | ICD-10-CM | POA: Diagnosis not present

## 2023-08-13 DIAGNOSIS — Z833 Family history of diabetes mellitus: Secondary | ICD-10-CM

## 2023-08-13 DIAGNOSIS — W1830XA Fall on same level, unspecified, initial encounter: Secondary | ICD-10-CM | POA: Diagnosis present

## 2023-08-13 DIAGNOSIS — Z8042 Family history of malignant neoplasm of prostate: Secondary | ICD-10-CM | POA: Diagnosis not present

## 2023-08-13 DIAGNOSIS — E7800 Pure hypercholesterolemia, unspecified: Secondary | ICD-10-CM | POA: Diagnosis present

## 2023-08-13 DIAGNOSIS — N1831 Chronic kidney disease, stage 3a: Secondary | ICD-10-CM | POA: Diagnosis present

## 2023-08-13 DIAGNOSIS — G459 Transient cerebral ischemic attack, unspecified: Secondary | ICD-10-CM | POA: Insufficient documentation

## 2023-08-13 DIAGNOSIS — N189 Chronic kidney disease, unspecified: Secondary | ICD-10-CM

## 2023-08-13 DIAGNOSIS — I129 Hypertensive chronic kidney disease with stage 1 through stage 4 chronic kidney disease, or unspecified chronic kidney disease: Secondary | ICD-10-CM | POA: Diagnosis present

## 2023-08-13 DIAGNOSIS — N179 Acute kidney failure, unspecified: Secondary | ICD-10-CM | POA: Diagnosis present

## 2023-08-13 DIAGNOSIS — F03A4 Unspecified dementia, mild, with anxiety: Secondary | ICD-10-CM | POA: Diagnosis present

## 2023-08-13 DIAGNOSIS — N1832 Chronic kidney disease, stage 3b: Secondary | ICD-10-CM | POA: Diagnosis present

## 2023-08-13 DIAGNOSIS — Z9842 Cataract extraction status, left eye: Secondary | ICD-10-CM

## 2023-08-13 DIAGNOSIS — R410 Disorientation, unspecified: Secondary | ICD-10-CM

## 2023-08-13 DIAGNOSIS — F0390 Unspecified dementia without behavioral disturbance: Secondary | ICD-10-CM | POA: Diagnosis not present

## 2023-08-13 DIAGNOSIS — E1122 Type 2 diabetes mellitus with diabetic chronic kidney disease: Secondary | ICD-10-CM | POA: Diagnosis present

## 2023-08-13 DIAGNOSIS — S7292XD Unspecified fracture of left femur, subsequent encounter for closed fracture with routine healing: Secondary | ICD-10-CM | POA: Diagnosis not present

## 2023-08-13 DIAGNOSIS — R296 Repeated falls: Secondary | ICD-10-CM | POA: Diagnosis present

## 2023-08-13 DIAGNOSIS — J45909 Unspecified asthma, uncomplicated: Secondary | ICD-10-CM | POA: Diagnosis present

## 2023-08-13 DIAGNOSIS — G14 Postpolio syndrome: Secondary | ICD-10-CM | POA: Diagnosis present

## 2023-08-13 DIAGNOSIS — S72002A Fracture of unspecified part of neck of left femur, initial encounter for closed fracture: Secondary | ICD-10-CM

## 2023-08-13 DIAGNOSIS — Z8673 Personal history of transient ischemic attack (TIA), and cerebral infarction without residual deficits: Secondary | ICD-10-CM | POA: Diagnosis not present

## 2023-08-13 DIAGNOSIS — J4520 Mild intermittent asthma, uncomplicated: Secondary | ICD-10-CM | POA: Diagnosis not present

## 2023-08-13 DIAGNOSIS — F22 Delusional disorders: Secondary | ICD-10-CM

## 2023-08-13 DIAGNOSIS — Z79899 Other long term (current) drug therapy: Secondary | ICD-10-CM

## 2023-08-13 DIAGNOSIS — K219 Gastro-esophageal reflux disease without esophagitis: Secondary | ICD-10-CM | POA: Diagnosis present

## 2023-08-13 DIAGNOSIS — E785 Hyperlipidemia, unspecified: Secondary | ICD-10-CM | POA: Insufficient documentation

## 2023-08-13 DIAGNOSIS — S7292XA Unspecified fracture of left femur, initial encounter for closed fracture: Principal | ICD-10-CM

## 2023-08-13 DIAGNOSIS — Z888 Allergy status to other drugs, medicaments and biological substances status: Secondary | ICD-10-CM | POA: Diagnosis not present

## 2023-08-13 LAB — CBC WITH DIFFERENTIAL/PLATELET
Abs Immature Granulocytes: 0.03 10*3/uL (ref 0.00–0.07)
Basophils Absolute: 0 10*3/uL (ref 0.0–0.1)
Basophils Relative: 0 %
Eosinophils Absolute: 0.1 10*3/uL (ref 0.0–0.5)
Eosinophils Relative: 1 %
HCT: 37.2 % (ref 36.0–46.0)
Hemoglobin: 11.9 g/dL — ABNORMAL LOW (ref 12.0–15.0)
Immature Granulocytes: 0 %
Lymphocytes Relative: 17 %
Lymphs Abs: 1.4 10*3/uL (ref 0.7–4.0)
MCH: 29 pg (ref 26.0–34.0)
MCHC: 32 g/dL (ref 30.0–36.0)
MCV: 90.7 fL (ref 80.0–100.0)
Monocytes Absolute: 0.5 10*3/uL (ref 0.1–1.0)
Monocytes Relative: 6 %
Neutro Abs: 6 10*3/uL (ref 1.7–7.7)
Neutrophils Relative %: 76 %
Platelets: 230 10*3/uL (ref 150–400)
RBC: 4.1 MIL/uL (ref 3.87–5.11)
RDW: 13.9 % (ref 11.5–15.5)
WBC: 8 10*3/uL (ref 4.0–10.5)
nRBC: 0 % (ref 0.0–0.2)

## 2023-08-13 LAB — BASIC METABOLIC PANEL
Anion gap: 8 (ref 5–15)
BUN: 30 mg/dL — ABNORMAL HIGH (ref 8–23)
CO2: 24 mmol/L (ref 22–32)
Calcium: 8.6 mg/dL — ABNORMAL LOW (ref 8.9–10.3)
Chloride: 104 mmol/L (ref 98–111)
Creatinine, Ser: 1.03 mg/dL — ABNORMAL HIGH (ref 0.44–1.00)
GFR, Estimated: 52 mL/min — ABNORMAL LOW (ref >60)
Glucose, Bld: 117 mg/dL — ABNORMAL HIGH (ref 70–99)
Potassium: 3.7 mmol/L (ref 3.5–5.1)
Sodium: 136 mmol/L (ref 135–145)

## 2023-08-13 LAB — TYPE AND SCREEN
ABO/RH(D): O POS
Antibody Screen: NEGATIVE

## 2023-08-13 LAB — PROTIME-INR
INR: 0.9 (ref 0.8–1.2)
Prothrombin Time: 12.6 s (ref 11.4–15.2)

## 2023-08-13 LAB — CBG MONITORING, ED: Glucose-Capillary: 111 mg/dL — ABNORMAL HIGH (ref 70–99)

## 2023-08-13 MED ORDER — ONDANSETRON HCL 4 MG/2ML IJ SOLN
4.0000 mg | Freq: Four times a day (QID) | INTRAMUSCULAR | Status: DC | PRN
Start: 2023-08-13 — End: 2023-08-15
  Administered 2023-08-14 (×2): 4 mg via INTRAVENOUS
  Filled 2023-08-13 (×2): qty 2

## 2023-08-13 MED ORDER — LACTATED RINGERS IV SOLN: INTRAVENOUS | Status: AC

## 2023-08-13 MED ORDER — HYDROMORPHONE HCL 1 MG/ML IJ SOLN
0.5000 mg | INTRAMUSCULAR | Status: AC | PRN
  Administered 2023-08-13 (×2): 0.5 mg via INTRAVENOUS
  Filled 2023-08-13 (×2): qty 1

## 2023-08-13 MED ORDER — ALBUTEROL SULFATE (2.5 MG/3ML) 0.083% IN NEBU: 2.5000 mg | INHALATION_SOLUTION | Freq: Four times a day (QID) | RESPIRATORY_TRACT | Status: DC | PRN

## 2023-08-13 MED ORDER — ATORVASTATIN CALCIUM 20 MG PO TABS
20.0000 mg | ORAL_TABLET | Freq: Every day | ORAL | Status: DC
  Administered 2023-08-14 – 2023-08-18 (×4): 20 mg via ORAL
  Filled 2023-08-13 (×4): qty 1

## 2023-08-13 MED ORDER — HYDRALAZINE HCL 20 MG/ML IJ SOLN: 5.0000 mg | Freq: Four times a day (QID) | INTRAMUSCULAR | Status: DC | PRN

## 2023-08-13 MED ORDER — SERTRALINE HCL 50 MG PO TABS: 100.0000 mg | ORAL_TABLET | Freq: Every day | ORAL | Status: DC

## 2023-08-13 MED ORDER — ACETAMINOPHEN 325 MG PO TABS
650.0000 mg | ORAL_TABLET | Freq: Four times a day (QID) | ORAL | Status: DC | PRN
Start: 2023-08-13 — End: 2023-08-15

## 2023-08-13 MED ORDER — MORPHINE SULFATE (PF) 2 MG/ML IV SOLN
0.5000 mg | INTRAVENOUS | Status: DC | PRN
  Administered 2023-08-14 (×3): 0.5 mg via INTRAVENOUS
  Filled 2023-08-13 (×3): qty 1

## 2023-08-13 MED ORDER — DOCUSATE SODIUM 100 MG PO CAPS
100.0000 mg | ORAL_CAPSULE | Freq: Two times a day (BID) | ORAL | Status: DC
  Administered 2023-08-13 – 2023-08-17 (×7): 100 mg via ORAL
  Filled 2023-08-13 (×9): qty 1

## 2023-08-13 MED ORDER — HYDROCODONE-ACETAMINOPHEN 5-325 MG PO TABS
1.0000 | ORAL_TABLET | Freq: Four times a day (QID) | ORAL | Status: DC | PRN
  Administered 2023-08-14 (×4): 2 via ORAL
  Filled 2023-08-13 (×4): qty 2

## 2023-08-13 MED ORDER — ALBUTEROL SULFATE HFA 108 (90 BASE) MCG/ACT IN AERS: 2.0000 | INHALATION_SPRAY | Freq: Four times a day (QID) | RESPIRATORY_TRACT | Status: DC | PRN

## 2023-08-13 NOTE — ED Provider Notes (Signed)
 [place] EMERGENCY DEPARTMENT AT [facility] Provider Note   CSN: [id number] Arrival date & time: 08/13/23  2031     History  Chief Complaint  Patient presents with   Marletta Lor    Jacqueline Foley is a 89 y.o. female.  HPI Patient presents via EMS, but is soon thereafter joined by her daughter.  She presents after witnessed mechanical fall.  Just prior to calling for transfer, patient fell on her left side, since that time has had severe pain in her left hip.  She is unwilling to move the hip secondary to pain.  EMS provided 200 mcg of fentanyl en route, patient's pain is still 8/10.  No other injuries, no other trauma, no other complaints.  Patient states that she was well prior to the event.    Home Medications Prior to Admission medications   Medication Sig Start Date End Date Taking? Authorizing Provider  albuterol (PROVENTIL HFA;VENTOLIN HFA) 108 (90 Base) MCG/ACT inhaler Inhale 2 puffs into the lungs every 6 (six) hours as needed for wheezing or shortness of breath.    [provider]  Albuterol Sulfate 2.5 MG/0.5ML NEBU as directed.    [provider]  atorvastatin (LIPITOR) 20 MG tablet Take 20 mg by mouth daily.    [provider]  azelastine (ASTELIN) 0.1 % nasal spray 1 puff in each nostril Nasally Twice a day for 30 days Patient not taking: Reported on 07/19/2023    [provider]  fluticasone (FLONASE) 50 MCG/ACT nasal spray Place 1 spray into both nostrils daily as needed.    [provider]  meloxicam (MOBIC) 7.5 MG tablet TAKE 1 TABLET BY MOUTH EVERY DAY Patient not taking: Reported on 07/19/2023 05/01/23   McCaughan, Dia D, DPM  nitroGLYCERIN (NITROSTAT) 0.4 MG SL tablet Place 1 tablet (0.4 mg total) under the tongue every 5 (five) minutes as needed for chest pain. 05/09/23 08/07/23  Anson Fret, MD  OVER THE COUNTER MEDICATION Aller-tec 10 mg    [provider]  sertraline (ZOLOFT) 100 MG tablet Take 1  tablet (100 mg total) by mouth at bedtime. Patient taking differently: Take 50 mg by mouth at bedtime. Takes 50 mg a day 12/01/22   Anson Fret, MD  triamterene-hydrochlorothiazide (MAXZIDE-25) 37.5-25 MG tablet Take 1 tablet by mouth daily. Take 1/2 tablet daily 03/03/20   [provider]      Allergies    Tomato    Review of Systems   Review of Systems  Physical Exam Updated Vital Signs BP (!) 146/86 (BP Location: Left Arm)   Pulse 87   Temp 98.3 F (36.8 C)   Resp 17   SpO2 97%  Physical Exam Vitals and nursing note reviewed.  Constitutional:      General: She is not in acute distress.    Appearance: She is well-developed.  HENT:     Head: Normocephalic and atraumatic.  Eyes:     Conjunctiva/sclera: Conjunctivae normal.  Cardiovascular:     Rate and Rhythm: Normal rate and regular rhythm.  Pulmonary:     Effort: Pulmonary effort is normal. No respiratory distress.     Breath sounds: Normal breath sounds. No stridor.  Abdominal:     General: There is no distension.  Musculoskeletal:       Arms:  Skin:    General: Skin is warm and dry.  Neurological:     Mental Status: She is alert and oriented to person, place, and time.  Cranial Nerves: No cranial nerve deficit.  Psychiatric:        Mood and Affect: Mood normal.     ED Results / Procedures / Treatments   Labs (all labs ordered are listed, but only abnormal results are displayed) Labs Reviewed  BASIC METABOLIC PANEL - Abnormal; Notable for the following components:      Result Value   Glucose, Bld 117 (*)    BUN 30 (*)    Creatinine, Ser 1.03 (*)    Calcium 8.6 (*)    GFR, Estimated 52 (*)    All other components within normal limits  CBC WITH DIFFERENTIAL/PLATELET - Abnormal; Notable for the following components:   Hemoglobin 11.9 (*)    All other components within normal limits  PROTIME-INR  TYPE AND SCREEN    EKG EKG Interpretation Date/Time:  Saturday August 13 2023 21:00:20  EDT Ventricular Rate:  82 PR Interval:  134 QRS Duration:  111 QT Interval:  383 QTC Calculation: 448 R Axis:   -20  Text Interpretation: Sinus rhythm Ventricular premature complex Borderline left axis deviation Confirmed by Gerhard Munch [id number]) on 08/13/2023 10:19:46 PM  Radiology DG Hip Unilat With Pelvis 2-3 Views Left Result Date: 08/13/2023 CLINICAL DATA:  Status post fall. EXAM: DG HIP (WITH OR WITHOUT PELVIS) 2-3V LEFT COMPARISON:  None Available. FINDINGS: There is an acute fracture deformity extending through the inter trochanteric region of the proximal left femur. Dorsal angulation of the fracture apex is seen. There is no evidence of dislocation. There is no evidence of significant arthropathy or other focal bone abnormality. IMPRESSION: Acute intertrochanteric fracture of the proximal left femur. Electronically Signed   By: Aram Candela M.D.   On: 08/13/2023 21:49   DG Chest 1 View Result Date: 08/13/2023 CLINICAL DATA:  Status post fall. EXAM: CHEST  1 VIEW COMPARISON:  July 08, 2020 FINDINGS: The heart size and mediastinal contours are within normal limits. Both lungs are clear. A chronic appearing deformity is seen along the greater tubercle of the left humeral head. Multilevel degenerative changes are present throughout the thoracic spine. IMPRESSION: No active cardiopulmonary disease. Electronically Signed   By: Aram Candela M.D.   On: 08/13/2023 21:48    Procedures Procedures    Medications Ordered in ED Medications  HYDROmorphone (DILAUDID) injection 0.5 mg (0.5 mg Intravenous Given 08/13/23 2110)    ED Course/ Medical Decision Making/ A&P                                 Medical Decision Making Adult female fall with pain in her leg.  Concern for fracture versus dislocation.  Absence of other complaints is somewhat reassuring, though the hip pain is a distracting injury, patient will require additional evaluation.  Initial vitals reassuring, patient  received analgesics, was placed on continuous monitoring, x-rays labs started.  Amount and/or Complexity of Data Reviewed Independent Historian: EMS    Details: And daughter at bedside Labs: ordered. Decision-making details documented in ED Course. Radiology: ordered and independent interpretation performed. Decision-making details documented in ED Course. ECG/medicine tests: ordered and independent interpretation performed. Decision-making details documented in ED Course.  Risk Prescription drug management. Decision regarding hospitalization.   10:19 PM I reviewed the x-ray with the patient's daughter at bedside, findings concerning for intertrochanteric left fracture.  I discussed the patient's presentation with orthopedic colleague, Dr. Shon Baton, and the patient will be admitted to our hospitalist colleague.  Patient's  physical exam is otherwise reassuring, labs noncontributory, x-ray without evidence for pneumonia, but with concern for new hip fracture, patient will require admission for anticipated surgical repair.        Final Clinical Impression(s) / ED Diagnoses Final diagnoses:  None    Rx / DC Orders ED Discharge Orders     None         Gerhard Munch, MD 08/13/23 2220

## 2023-08-13 NOTE — ED Triage Notes (Signed)
 Pt coming from daughters house due to a witnessed fall. No LOC, no blood thinners, and denied hitting head. Pt unable to state what caused the fall but denies dizziness. Pt c/o of left hip pain. Experienced previous injury to hip a few weeks prior.   200 mcg Fentanyl BP 155/90 HR 100 RR 30 ETCO2 25

## 2023-08-13 NOTE — H&P (Signed)
 History and Physical    Jacqueline Foley ZOX:[id number] DOB: 1933/01/06 DOA: 08/13/2023  PCP: Jackelyn Poling, DO   Patient coming from: Home   Chief Complaint:  Chief Complaint  Patient presents with   Fall   ED TRIAGE note:  Pt coming from daughters house due to a witnessed fall. No LOC, no blood thinners, and denied hitting head. Pt unable to state what caused the fall but denies dizziness. Pt c/o of left hip pain. Experienced previous injury to hip a few weeks prior.    200 mcg Fentanyl BP 155/90 HR 100 RR 30 ETCO2 25            HPI:  Jacqueline Foley is a 89 y.o. female with medical history significant of CKD stage IIIa, TIA, GERD, essential hypertension, hyperlipidemia, and dementia presented to emergency department with complaining of pain weakness fall.  Patient has been accompanied by the daughter.  Since the fall patient is complaining about left-sided hip pain.  Patient is unwilling to move the left-sided hip due to the pain.  And route to ED via EMS patient has been given fentanyl 200 mcg still pain is about 8 out of 10.  Denies any head trauma, loss of consciousness and no other complaint at this time. Patient denies any headache, blurry vision, chest pain, palpitation, shortness of breath, abdominal pain, constipation and diarrhea.   ED Course:  At that presentation to ED patient is hemodynamically stable. Normal blood glucose level. CBC showing stable H&H 11.9 and 37.  Otherwise unremarkable. BMP unremarkable except elevated creatinine 1.3 elevated BUN 30.  X-ray of the right hip showing: Acute intertrochanteric fracture of the proximal left femur.  Chest x-ray no acute disease process.  ED physician Dr. Jeraldine Loots has been consulted to spoke with orthopedic surgeon Dr. Shon Baton who will evaluate patient soon possible surgical intervention tomorrow, recommended to keep patient n.p.o. and patient can stay at [facility].  In the ED patient has been  given Dilaudid 0.5 mg.  Hospitalist has been consulted for further evaluation management of femoral fracture and AKI.  Significant labs in the ED: Lab Orders         Basic metabolic panel         CBC with Differential         Protime-INR         CBC         Comprehensive metabolic panel         APTT         Protime-INR         Urinalysis, Routine w reflex microscopic -Urine, Clean Catch         Creatinine, urine, random         Sodium, urine, random         CBG monitoring, ED       Review of Systems:  Review of Systems  Constitutional:  Negative for chills, fever and weight loss.  Respiratory:  Negative for cough and shortness of breath.   Cardiovascular:  Negative for chest pain and palpitations.  Gastrointestinal:  Negative for abdominal pain, heartburn, nausea and vomiting.  Musculoskeletal:  Positive for falls and joint pain. Negative for back pain, myalgias and neck pain.  Neurological:  Negative for dizziness and headaches.  Endo/Heme/Allergies:  Does not bruise/bleed easily.  Psychiatric/Behavioral:  The patient is not nervous/anxious.   All other systems reviewed and are negative.   Past Medical History:  Diagnosis Date   Chest pain at rest, negative  MI, negative stress test, may have been GI 12/06/2013   Diabetes mellitus without complication (HCC)    DOE (dyspnea on exertion), may be due to HTN 12/06/2013   GERD (gastroesophageal reflux disease)    H/O diabetes mellitus    HTN (hypertension) 12/06/2013   Hypercholesteremia    Hypertension    TIA (transient ischemic attack)     Past Surgical History:  Procedure Laterality Date   CATARACT EXTRACTION, BILATERAL  2018   EYE SURGERY     FOOT SURGERY       reports that she has never smoked. She has never used smokeless tobacco. She reports that she does not currently use alcohol. She reports that she does not use drugs.  Allergies  Allergen Reactions   Tomato Swelling and Rash    Family History  Problem  Relation Age of Onset   Diabetes Mother    Heart disease Mother    Hypertension Mother    Prostate cancer Father     Prior to Admission medications   Medication Sig Start Date End Date Taking? Authorizing Provider  albuterol (PROVENTIL HFA;VENTOLIN HFA) 108 (90 Base) MCG/ACT inhaler Inhale 2 puffs into the lungs every 6 (six) hours as needed for wheezing or shortness of breath.    [provider]  Albuterol Sulfate 2.5 MG/0.5ML NEBU as directed.    [provider]  atorvastatin (LIPITOR) 20 MG tablet Take 20 mg by mouth daily.    [provider]  azelastine (ASTELIN) 0.1 % nasal spray 1 puff in each nostril Nasally Twice a day for 30 days Patient not taking: Reported on 07/19/2023    [provider]  fluticasone (FLONASE) 50 MCG/ACT nasal spray Place 1 spray into both nostrils daily as needed.    [provider]  meloxicam (MOBIC) 7.5 MG tablet TAKE 1 TABLET BY MOUTH EVERY DAY Patient not taking: Reported on 07/19/2023 05/01/23   McCaughan, Dia D, DPM  nitroGLYCERIN (NITROSTAT) 0.4 MG SL tablet Place 1 tablet (0.4 mg total) under the tongue every 5 (five) minutes as needed for chest pain. 05/09/23 08/07/23  Anson Fret, MD  OVER THE COUNTER MEDICATION Aller-tec 10 mg    [provider]  sertraline (ZOLOFT) 100 MG tablet Take 1 tablet (100 mg total) by mouth at bedtime. Patient taking differently: Take 50 mg by mouth at bedtime. Takes 50 mg a day 12/01/22   Anson Fret, MD  triamterene-hydrochlorothiazide (MAXZIDE-25) 37.5-25 MG tablet Take 1 tablet by mouth daily. Take 1/2 tablet daily 03/03/20   [provider]     Physical Exam: Vitals:   08/13/23 2044  BP: (!) 146/86  Pulse: 87  Resp: 17  Temp: 98.3 F (36.8 C)  SpO2: 97%    Physical Exam Constitutional:      Appearance: Normal appearance. She is not ill-appearing.     Comments: Pleasantly confused due to underlying dementia.  HENT:     Mouth/Throat:      Mouth: Mucous membranes are moist.  Cardiovascular:     Rate and Rhythm: Normal rate and regular rhythm.     Pulses: Normal pulses.     Heart sounds: Normal heart sounds.  Pulmonary:     Effort: Pulmonary effort is normal.     Breath sounds: Normal breath sounds.  Abdominal:     Palpations: Abdomen is soft.  Musculoskeletal:        General: Tenderness and deformity present. No signs of injury.     Cervical back: Neck supple.  Right lower leg: No edema.     Left lower leg: No edema.  Skin:    Capillary Refill: Capillary refill takes less than 2 seconds.  Neurological:     Mental Status: She is alert.     Comments: Alert and oriented to self.  Psychiatric:     Comments: Unable to assess      Labs on Admission: I have personally reviewed following labs and imaging studies  CBC: Recent Labs  Lab 08/13/23 2106  WBC 8.0  NEUTROABS 6.0  HGB 11.9*  HCT 37.2  MCV 90.7  PLT 230   Basic Metabolic Panel: Recent Labs  Lab 08/13/23 2106  NA 136  K 3.7  CL 104  CO2 24  GLUCOSE 117*  BUN 30*  CREATININE 1.03*  CALCIUM 8.6*   GFR: CrCl cannot be calculated (Unknown ideal weight.). Liver Function Tests: No results for input(s): "AST", "ALT", "ALKPHOS", "BILITOT", "PROT", "ALBUMIN" in the last 168 hours. No results for input(s): "LIPASE", "AMYLASE" in the last 168 hours. No results for input(s): "AMMONIA" in the last 168 hours. Coagulation Profile: Recent Labs  Lab 08/13/23 2106  INR 0.9   Cardiac Enzymes: No results for input(s): "CKTOTAL", "CKMB", "CKMBINDEX", "TROPONINI", "TROPONINIHS" in the last 168 hours. BNP (last 3 results) No results for input(s): "BNP" in the last 8760 hours. HbA1C: No results for input(s): "HGBA1C" in the last 72 hours. CBG: Recent Labs  Lab 08/13/23 2231  GLUCAP 111*   Lipid Profile: No results for input(s): "CHOL", "HDL", "LDLCALC", "TRIG", "CHOLHDL", "LDLDIRECT" in the last 72 hours. Thyroid Function Tests: No results for  input(s): "TSH", "T4TOTAL", "FREET4", "T3FREE", "THYROIDAB" in the last 72 hours. Anemia Panel: No results for input(s): "VITAMINB12", "FOLATE", "FERRITIN", "TIBC", "IRON", "RETICCTPCT" in the last 72 hours. Urine analysis:    Component Value Date/Time   COLORURINE YELLOW 03/12/2023 1410   APPEARANCEUR CLEAR 03/12/2023 1410   APPEARANCEUR Clear 12/01/2022 1008   LABSPEC 1.023 03/12/2023 1410   PHURINE 5.0 03/12/2023 1410   GLUCOSEU NEGATIVE 03/12/2023 1410   HGBUR SMALL (A) 03/12/2023 1410   BILIRUBINUR NEGATIVE 03/12/2023 1410   BILIRUBINUR Negative 12/01/2022 1008   KETONESUR 5 (A) 03/12/2023 1410   PROTEINUR NEGATIVE 03/12/2023 1410   NITRITE POSITIVE (A) 03/12/2023 1410   LEUKOCYTESUR TRACE (A) 03/12/2023 1410    Radiological Exams on Admission: I have personally reviewed images DG Hip Unilat With Pelvis 2-3 Views Left Result Date: 08/13/2023 CLINICAL DATA:  Status post fall. EXAM: DG HIP (WITH OR WITHOUT PELVIS) 2-3V LEFT COMPARISON:  None Available. FINDINGS: There is an acute fracture deformity extending through the inter trochanteric region of the proximal left femur. Dorsal angulation of the fracture apex is seen. There is no evidence of dislocation. There is no evidence of significant arthropathy or other focal bone abnormality. IMPRESSION: Acute intertrochanteric fracture of the proximal left femur. Electronically Signed   By: Aram Candela M.D.   On: 08/13/2023 21:49   DG Chest 1 View Result Date: 08/13/2023 CLINICAL DATA:  Status post fall. EXAM: CHEST  1 VIEW COMPARISON:  July 08, 2020 FINDINGS: The heart size and mediastinal contours are within normal limits. Both lungs are clear. A chronic appearing deformity is seen along the greater tubercle of the left humeral head. Multilevel degenerative changes are present throughout the thoracic spine. IMPRESSION: No active cardiopulmonary disease. Electronically Signed   By: Aram Candela M.D.   On: 08/13/2023 21:48      EKG: My personal interpretation of EKG shows: Normal sinus  rhythm heart rate 82 and premature ventricular complex.    Assessment/Plan: Principal Problem:   Femur fracture, left (HCC) Active Problems:   Closed left femoral fracture (HCC)   Fall at home, initial encounter   Acute kidney injury superimposed on CKD (HCC)   Essential hypertension   TIA (transient ischemic attack)   GERD (gastroesophageal reflux disease)   Hyperlipidemia   Dementia without behavioral disturbance (HCC)   Reactive airway disease    Assessment and Plan: Left proximal femur intertrochanteric fracture-secondary to fall Mechanical fall -Patient presenting to emergency department complaining of witnessed mechanical ground-level fall since then complaining about left-sided hip joint pain.  Hemodynamically stable.  CBC unremarkable.  CMP showing elevated creatinine. - X-ray hip showed acute intertrochanteric fracture of the left proximal femur - Orthopedic surgeon Dr. Shon Baton has been consulted plan to see patient in the daytime for possible plan for surgical intervention.  Recommended to keep patient n.p.o. midnight. -Continue fall precaution, and bedrest - Continue hydrocodone every 6 hour as needed for moderate pain and morphine 0.5 mg every 2 hours as needed for severe pain. - Need to consult PT and OT after surgery. -Continue regular diet and will keep patient n.p.o. after midnight.  Acute kidney injury superimposed on CKD stage IIIb -Elevated creatinine 1.03.  Baseline normal creatinine and GFR around 56 five months ago. - Prerenal acute kidney injury likely in the setting of side effect of Maxide.   Checking UA, urine creatinine and urine sodium - Starting maintenance fluid LR 75 cc/h, monitor renal function, urine output and avoid nephrotoxic agent.  Essential hypertension -Holding Maxide in the setting of AKI.  Blood pressure in the ED within good range. - Continue hydralazine as  needed  History of TIA -Not on any antiplatelet therapy at home except currently on Lipitor at home. -Continue Lipitor   Hyperlipidemia -Continue Lipitor  Dementia without behavioral disturbance Generalized anxiety disorder - Continue Zoloft - Continue delirium precaution  Reactive airway disease -Continue albuterol as needed    DVT prophylaxis:  SCDs Code Status:  Full Code Diet: Heart healthy diet, n.p.o. after midnight Family Communication:   Family was present at bedside, at the time of interview. Opportunity was given to ask question and all questions were answered satisfactorily.  Disposition Plan: Will follow-up with orthopedic surgeon for plan for surgery tomorrow. Consults: Orthopedic surgery Admission status:   Inpatient, Med-Surg  Severity of Illness: The appropriate patient status for this patient is INPATIENT. Inpatient status is judged to be reasonable and necessary in order to provide the required intensity of service to ensure the patient's safety. The patient's presenting symptoms, physical exam findings, and initial radiographic and laboratory data in the context of their chronic comorbidities is felt to place them at high risk for further clinical deterioration. Furthermore, it is not anticipated that the patient will be medically stable for discharge from the hospital within 2 midnights of admission.   * I certify that at the point of admission it is my clinical judgment that the patient will require inpatient hospital care spanning beyond 2 midnights from the point of admission due to high intensity of service, high risk for further deterioration and high frequency of surveillance required.Marland Kitchen    Tereasa Coop, MD [facility]  How to contact the [facility] Attending or Consulting provider 7A - 7P or covering provider during after hours 7P -7A, for this patient.  Check the care team in [facility] and look for a) attending/consulting [facility] provider listed and b) the  TR[facility] team listed Log into [email or web address] and use [facility] universal password to access. If you do not have the password, please contact the hospital operator. Locate the [facility] provider you are looking for under [facility] and page to a number that you can be directly reached. If you still have difficulty reaching the provider, please page the [facility] (Director on Call) for the Hospitalists listed on amion for assistance.  08/13/2023, 11:19 PM

## 2023-08-14 ENCOUNTER — Inpatient Hospital Stay (HOSPITAL_COMMUNITY)

## 2023-08-14 DIAGNOSIS — S7292XD Unspecified fracture of left femur, subsequent encounter for closed fracture with routine healing: Secondary | ICD-10-CM | POA: Diagnosis not present

## 2023-08-14 LAB — COMPREHENSIVE METABOLIC PANEL
ALT: 24 U/L (ref 0–44)
AST: 26 U/L (ref 15–41)
Albumin: 3.3 g/dL — ABNORMAL LOW (ref 3.5–5.0)
Alkaline Phosphatase: 87 U/L (ref 38–126)
Anion gap: 7 (ref 5–15)
BUN: 29 mg/dL — ABNORMAL HIGH (ref 8–23)
CO2: 28 mmol/L (ref 22–32)
Calcium: 8.8 mg/dL — ABNORMAL LOW (ref 8.9–10.3)
Chloride: 103 mmol/L (ref 98–111)
Creatinine, Ser: 1.09 mg/dL — ABNORMAL HIGH (ref 0.44–1.00)
GFR, Estimated: 49 mL/min — ABNORMAL LOW (ref >60)
Glucose, Bld: 154 mg/dL — ABNORMAL HIGH (ref 70–99)
Potassium: 4 mmol/L (ref 3.5–5.1)
Sodium: 138 mmol/L (ref 135–145)
Total Bilirubin: 0.4 mg/dL (ref 0.0–1.2)
Total Protein: 6.8 g/dL (ref 6.5–8.1)

## 2023-08-14 LAB — PROTIME-INR
INR: 1.1 (ref 0.8–1.2)
Prothrombin Time: 13.9 s (ref 11.4–15.2)

## 2023-08-14 LAB — APTT: aPTT: 26 s (ref 24–36)

## 2023-08-14 LAB — URINALYSIS, ROUTINE W REFLEX MICROSCOPIC
Bilirubin Urine: NEGATIVE
Glucose, UA: NEGATIVE mg/dL
Ketones, ur: NEGATIVE mg/dL
Nitrite: POSITIVE — AB
Protein, ur: NEGATIVE mg/dL
Specific Gravity, Urine: 1.016 (ref 1.005–1.030)
pH: 5 (ref 5.0–8.0)

## 2023-08-14 LAB — CBC
HCT: 36.3 % (ref 36.0–46.0)
Hemoglobin: 11.1 g/dL — ABNORMAL LOW (ref 12.0–15.0)
MCH: 28.6 pg (ref 26.0–34.0)
MCHC: 30.6 g/dL (ref 30.0–36.0)
MCV: 93.6 fL (ref 80.0–100.0)
Platelets: 222 10*3/uL (ref 150–400)
RBC: 3.88 MIL/uL (ref 3.87–5.11)
RDW: 14 % (ref 11.5–15.5)
WBC: 11.7 10*3/uL — ABNORMAL HIGH (ref 4.0–10.5)
nRBC: 0 % (ref 0.0–0.2)

## 2023-08-14 LAB — SURGICAL PCR SCREEN
MRSA, PCR: NEGATIVE
Staphylococcus aureus: NEGATIVE

## 2023-08-14 LAB — ABO/RH: ABO/RH(D): O POS

## 2023-08-14 LAB — CREATININE, URINE, RANDOM: Creatinine, Urine: 109 mg/dL

## 2023-08-14 LAB — SODIUM, URINE, RANDOM: Sodium, Ur: 56 mmol/L

## 2023-08-14 MED ORDER — SERTRALINE HCL 50 MG PO TABS
50.0000 mg | ORAL_TABLET | Freq: Every day | ORAL | Status: DC
  Administered 2023-08-14 – 2023-08-17 (×4): 50 mg via ORAL
  Filled 2023-08-14 (×4): qty 1

## 2023-08-14 MED ORDER — MORPHINE SULFATE (PF) 2 MG/ML IV SOLN
1.0000 mg | INTRAVENOUS | Status: DC | PRN
Start: 2023-08-14 — End: 2023-08-15
  Administered 2023-08-14: 2 mg via INTRAVENOUS
  Administered 2023-08-14: 1.5 mg via INTRAVENOUS
  Administered 2023-08-14: 1 mg via INTRAVENOUS
  Administered 2023-08-14 – 2023-08-15 (×4): 2 mg via INTRAVENOUS
  Filled 2023-08-14 (×7): qty 1

## 2023-08-14 NOTE — Consult Note (Signed)
 Reason for Consult:L hip fx Referring Physician: Dr Gareth Morgan is an 89 y.o. female.  HPI: fell yesterday at home onto L hip. 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 Had fallen onto the L hip 3 weeks ago and was negative for fx then. Denies other injuries from her fall.  Past Medical History:  Diagnosis Date   Chest pain at rest, negative MI, negative stress test, may have been GI 12/06/2013   Diabetes mellitus without complication (HCC)    DOE (dyspnea on exertion), may be due to HTN 12/06/2013   GERD (gastroesophageal reflux disease)    H/O diabetes mellitus    HTN (hypertension) 12/06/2013   Hypercholesteremia    Hypertension    TIA (transient ischemic attack)     Past Surgical History:  Procedure Laterality Date   CATARACT EXTRACTION, BILATERAL  2018   EYE SURGERY     FOOT SURGERY      Family History  Problem Relation Age of Onset   Diabetes Mother    Heart disease Mother    Hypertension Mother    Prostate cancer Father     Social History:  reports that she has never smoked. She has never used smokeless tobacco. She reports that she does not currently use alcohol. She reports that she does not use drugs.  Allergies:  Allergies  Allergen Reactions   Tomato Swelling and Rash    Medications: I have reviewed the patient's current medications.  Results for orders placed or performed during the hospital encounter of 08/13/23 (from the past 48 hours)  Basic metabolic panel     Status: Abnormal   Collection Time: 08/13/23  9:06 PM  Result Value Ref Range   Sodium 136 135 - 145 mmol/L   Potassium 3.7 3.5 - 5.1 mmol/L   Chloride 104 98 - 111 mmol/L   CO2 24 22 - 32 mmol/L   Glucose, Bld 117 (H) 70 - 99 mg/dL    Comment: Glucose reference range applies only to samples taken after fasting for at least 8 hours.   BUN 30 (H) 8 - 23 mg/dL   Creatinine, Ser 8.65 (H) 0.44 - 1.00 mg/dL   Calcium 8.6 (L) 8.9 - 10.3 mg/dL    GFR, Estimated 52 (L) >60 mL/min    Comment: (NOTE) Calculated using the CKD-EPI Creatinine Equation (2021)    Anion gap 8 5 - 15    Comment: Performed at [facility], [address], [place], [place] [address]  CBC with Differential     Status: Abnormal   Collection Time: 08/13/23  9:06 PM  Result Value Ref Range   WBC 8.0 4.0 - 10.5 K/uL   RBC 4.10 3.87 - 5.11 MIL/uL   Hemoglobin 11.9 (L) 12.0 - 15.0 g/dL   HCT 62.9 52.8 - 41.3 %   MCV 90.7 80.0 - 100.0 fL   MCH 29.0 26.0 - 34.0 pg   MCHC 32.0 30.0 - 36.0 g/dL   RDW 24.4 01.0 - 27.2 %   Platelets 230 150 - 400 K/uL   nRBC 0.0 0.0 - 0.2 %   Neutrophils Relative % 76 %   Neutro Abs 6.0 1.7 - 7.7 K/uL   Lymphocytes Relative 17 %   Lymphs Abs 1.4 0.7 - 4.0 K/uL   Monocytes Relative 6 %   Monocytes Absolute 0.5 0.1 - 1.0 K/uL   Eosinophils Relative 1 %   Eosinophils Absolute 0.1 0.0 - 0.5 K/uL  Basophils Relative 0 %   Basophils Absolute 0.0 0.0 - 0.1 K/uL   Immature Granulocytes 0 %   Abs Immature Granulocytes 0.03 0.00 - 0.07 K/uL    Comment: Performed at [facility], [address], [place], [place] [address]  Protime-INR     Status: None   Collection Time: 08/13/23  9:06 PM  Result Value Ref Range   Prothrombin Time 12.6 11.4 - 15.2 seconds   INR 0.9 0.8 - 1.2    Comment: (NOTE) INR goal varies based on device and disease states. Performed at [facility], [address], [place], [place] [address]   Type and screen [facility]     Status: None   Collection Time: 08/13/23  9:06 PM  Result Value Ref Range   ABO/RH(D) O POS    Antibody Screen NEG    Sample Expiration      08/16/2023,2359 Performed at [facility], [address], [place], [place] [address]   CBG monitoring, ED     Status: Abnormal   Collection Time: 08/13/23 10:31 PM  Result Value Ref Range   Glucose-Capillary 111 (H) 70 - 99 mg/dL    Comment:  Glucose reference range applies only to samples taken after fasting for at least 8 hours.  CBC     Status: Abnormal   Collection Time: 08/14/23  3:07 AM  Result Value Ref Range   WBC 11.7 (H) 4.0 - 10.5 K/uL   RBC 3.88 3.87 - 5.11 MIL/uL   Hemoglobin 11.1 (L) 12.0 - 15.0 g/dL   HCT 10.2 72.5 - 36.6 %   MCV 93.6 80.0 - 100.0 fL   MCH 28.6 26.0 - 34.0 pg   MCHC 30.6 30.0 - 36.0 g/dL   RDW 44.0 34.7 - 42.5 %   Platelets 222 150 - 400 K/uL   nRBC 0.0 0.0 - 0.2 %    Comment: Performed at [facility], [address] [place], [place] [address]  Comprehensive metabolic panel     Status: Abnormal   Collection Time: 08/14/23  3:07 AM  Result Value Ref Range   Sodium 138 135 - 145 mmol/L   Potassium 4.0 3.5 - 5.1 mmol/L   Chloride 103 98 - 111 mmol/L   CO2 28 22 - 32 mmol/L   Glucose, Bld 154 (H) 70 - 99 mg/dL    Comment: Glucose reference range applies only to samples taken after fasting for at least 8 hours.   BUN 29 (H) 8 - 23 mg/dL   Creatinine, Ser 7.56 (H) 0.44 - 1.00 mg/dL   Calcium 8.8 (L) 8.9 - 10.3 mg/dL   Total Protein 6.8 6.5 - 8.1 g/dL   Albumin 3.3 (L) 3.5 - 5.0 g/dL   AST 26 15 - 41 U/L   ALT 24 0 - 44 U/L   Alkaline Phosphatase 87 38 - 126 U/L   Total Bilirubin 0.4 0.0 - 1.2 mg/dL   GFR, Estimated 49 (L) >60 mL/min    Comment: (NOTE) Calculated using the CKD-EPI Creatinine Equation (2021)    Anion gap 7 5 - 15    Comment: Performed at [facility], [address], [place], [place] [address]  APTT     Status: None   Collection Time: 08/14/23  3:07 AM  Result Value Ref Range   aPTT 26 24 - 36 seconds    Comment: Performed at [facility], [address] [place], [place]  [address]  Protime-INR     Status: None   Collection Time: 08/14/23  3:07 AM  Result Value Ref Range   Prothrombin Time 13.9 11.4 - 15.2 seconds   INR 1.1 0.8 - 1.2    Comment: (NOTE) INR goal varies based on device and disease  states. Performed at [facility], [address], [place], [place] [address]   ABO/Rh     Status: None   Collection Time: 08/14/23  7:20 AM  Result Value Ref Range   ABO/RH(D)      O POS Performed at [facility], [address] [place], [place] [address]     DG Hip Unilat With Pelvis 2-3 Views Left Result Date: 08/13/2023 CLINICAL DATA:  Status post fall. EXAM: DG HIP (WITH OR WITHOUT PELVIS) 2-3V LEFT COMPARISON:  None Available. FINDINGS: There is an acute fracture deformity extending through the inter trochanteric region of the proximal left femur. Dorsal angulation of the fracture apex is seen. There is no evidence of dislocation. There is no evidence of significant arthropathy or other focal bone abnormality. IMPRESSION: Acute intertrochanteric fracture of the proximal left femur. Electronically Signed   By: Aram Candela M.D.   On: 08/13/2023 21:49   DG Chest 1 View Result Date: 08/13/2023 CLINICAL DATA:  Status post fall. EXAM: CHEST  1 VIEW COMPARISON:  July 08, 2020 FINDINGS: The heart size and mediastinal contours are within normal limits. Both lungs are clear. A chronic appearing deformity is seen along the greater tubercle of the left humeral head. Multilevel degenerative changes are present throughout the thoracic spine. IMPRESSION: No active cardiopulmonary disease. Electronically Signed   By: Aram Candela M.D.   On: 08/13/2023 21:48    Review of Systems Blood pressure 125/85, pulse 85, temperature 98.2 F (36.8 C), temperature source Oral, resp. rate 18, height 5\' 7"  (1.702 m), weight 64 kg, SpO2 98%. Physical Exam Constitutional:      Appearance: Normal appearance.  HENT:     Head: Normocephalic and atraumatic.     Right Ear: External ear normal.     Left Ear: External ear normal.     Nose: Nose normal.     Mouth/Throat:     Pharynx: Oropharynx is clear.  Eyes:     Conjunctiva/sclera: Conjunctivae normal.   Cardiovascular:     Rate and Rhythm: Normal rate.     Pulses: Normal pulses.  Pulmonary:     Effort: Pulmonary effort is normal.  Abdominal:     General: Bowel sounds are normal.  Musculoskeletal:     Cervical back: Normal range of motion.     Comments: L leg shortened and ER No calf pain or sign of DVT Sensation intact distally Decreased ROM L hip  Neurological:     Mental Status: She is alert.    Xrays hip/pelvis with IT fx L hip  Assessment/Plan: Admit to hospitalist service NPO for possible OR Today, otherwise tomorrow, currently working on OR/surgeon availability Pain control Discussed with Dr Shon Baton and Dr Shelle Iron Pt family in room, questions answered  Jacqueline Foley 08/14/2023, 8:40 AM

## 2023-08-14 NOTE — Progress Notes (Signed)
   08/14/23 0841  TOC Brief Assessment  Insurance and Status Reviewed  Patient has primary care physician Yes  Home environment has been reviewed ILF alone  Prior level of function: independent  Prior/Current Home Services No current home services  Social Drivers of Health Review SDOH reviewed no interventions necessary  Readmission risk has been reviewed Yes  Transition of care needs transition of care needs identified, TOC will continue to follow

## 2023-08-14 NOTE — Plan of Care (Signed)
  Problem: Education: Goal: Knowledge of General Education information will improve Description: Including pain rating scale, medication(s)/side effects and non-pharmacologic comfort measures Outcome: Progressing   Problem: Pain Managment: Goal: General experience of comfort will improve and/or be controlled Outcome: Progressing

## 2023-08-14 NOTE — Progress Notes (Signed)
 PROGRESS NOTE  Jacqueline Foley ZOX:[id number] DOB: [birth year] DOA: 08/13/2023 PCP: Jackelyn Poling, DO   LOS: 1 day   Brief Narrative / Interim history: 89 year old female with CKD 3A, HTN, HLD, cognitive impairment comes to the hospital with pain after having a fall.  There was no loss of consciousness, it happened in the split-second.  Patient was visiting her daughter's house.  X-ray in the ED showed acute intertrochanteric fracture of the proximal left femur.  Orthopedic surgery was consulted  Subjective / 24h Interval events: Complains of severe hip pain  Assesement and Plan: Principal Problem:   Femur fracture, left (HCC) Active Problems:   Closed left femoral fracture (HCC)   Fall at home, initial encounter   Acute kidney injury superimposed on CKD (HCC)   Essential hypertension   TIA (transient ischemic attack)   GERD (gastroesophageal reflux disease)   Hyperlipidemia   Dementia without behavioral disturbance (HCC)   Reactive airway disease   Principal problem Left proximal femur intertrochanteric fracture-secondary to fall, orthopedic surgery consulted she will be taken to the OR either later today or tomorrow  Active problems CKD 3a -baseline creatinine around 1.0.  Currently at baseline  HTN-normotensive, hold home antihypertensives  Hyperlipidemia-continue statin  Mild dementia-sees neurology as an outpatient  Scheduled Meds:  atorvastatin  20 mg Oral Daily   docusate sodium  100 mg Oral BID   sertraline  50 mg Oral QHS   Continuous Infusions:  lactated ringers 75 mL/hr at 08/13/23 2351   PRN Meds:.acetaminophen, albuterol, hydrALAZINE, HYDROcodone-acetaminophen, morphine injection, ondansetron (ZOFRAN) IV  Current Outpatient Medications  Medication Instructions   albuterol (PROVENTIL HFA;VENTOLIN HFA) 108 (90 Base) MCG/ACT inhaler 2 puffs, Inhalation, Every 6 hours PRN   atorvastatin (LIPITOR) 20 mg, Oral, Every morning   fluticasone (FLONASE) 50  MCG/ACT nasal spray 1 spray, Each Nare, Daily   fluticasone-salmeterol (ADVAIR HFA) 230-21 MCG/ACT inhaler 2 puffs, 2 times daily   nitroGLYCERIN (NITROSTAT) 0.4 mg, Sublingual, Every 5 min PRN   nitroGLYCERIN (NITROSTAT) 0.4 mg, Every 5 min PRN   OVER THE COUNTER MEDICATION 1 tablet, Oral, Daily, Aller-tec 10 mg   sertraline (ZOLOFT) 100 mg, Oral, Nightly   triamterene-hydrochlorothiazide (MAXZIDE-25) 37.5-25 MG tablet 0.5 tablets, Oral, Every morning    Diet Orders (From admission, onward)     Start     Ordered   08/14/23 0001  Diet NPO time specified Except for: Ice Chips, Sips with Meds  Diet effective midnight       Question Answer Comment  Except for Ice Chips   Except for Sips with Meds      08/13/23 2251            DVT prophylaxis: SCDs Start: 08/13/23 2251 Place TED hose Start: 08/13/23 2251   Lab Results  Component Value Date   PLT 222 08/14/2023      Code Status: Full Code  Family Communication: no family at bedside   Status is: Inpatient Remains inpatient appropriate because: severity of illness  Level of care: Med-Surg  Consultants:  Orthopedic surgery   Objective: Vitals:   08/14/23 0010 08/14/23 0157 08/14/23 0332 08/14/23 0951  BP: 119/76  125/85 124/84  Pulse: 79  85 77  Resp: 20  18 17   Temp: 98 F (36.7 C)  98.2 F (36.8 C) 98.2 F (36.8 C)  TempSrc: Oral  Oral   SpO2: 99%  98% 98%  Weight:  64 kg    Height:  5\' 7"  (1.702 m)  Intake/Output Summary (Last 24 hours) at 08/14/2023 1051 Last data filed at 08/14/2023 0630 Gross per 24 hour  Intake 648.75 ml  Output --  Net 648.75 ml   Wt Readings from Last 3 Encounters:  08/14/23 64 kg  07/19/23 61.7 kg  06/10/23 60.8 kg    Examination:  Constitutional: NAD Eyes: no scleral icterus ENMT: Mucous membranes are moist.  Neck: normal, supple Respiratory: clear to auscultation bilaterally, no wheezing, no crackles. Cardiovascular: Regular rate and rhythm, no murmurs / rubs /  gallops. No LE edema.  Abdomen: non distended, no tenderness. Bowel sounds positive.  Musculoskeletal: no clubbing / cyanosis.    Data Reviewed: I have independently reviewed following labs and imaging studies   CBC Recent Labs  Lab 08/13/23 2106 08/14/23 0307  WBC 8.0 11.7*  HGB 11.9* 11.1*  HCT 37.2 36.3  PLT 230 222  MCV 90.7 93.6  MCH 29.0 28.6  MCHC 32.0 30.6  RDW 13.9 14.0  LYMPHSABS 1.4  --   MONOABS 0.5  --   EOSABS 0.1  --   BASOSABS 0.0  --     Recent Labs  Lab 08/13/23 2106 08/14/23 0307  NA 136 138  K 3.7 4.0  CL 104 103  CO2 24 28  GLUCOSE 117* 154*  BUN 30* 29*  CREATININE 1.03* 1.09*  CALCIUM 8.6* 8.8*  AST  --  26  ALT  --  24  ALKPHOS  --  87  BILITOT  --  0.4  ALBUMIN  --  3.3*  INR 0.9 1.1    ------------------------------------------------------------------------------------------------------------------ No results for input(s): "CHOL", "HDL", "LDLCALC", "TRIG", "CHOLHDL", "LDLDIRECT" in the last 72 hours.  Lab Results  Component Value Date   HGBA1C 5.7 (H) 12/01/2022   ------------------------------------------------------------------------------------------------------------------ No results for input(s): "TSH", "T4TOTAL", "T3FREE", "THYROIDAB" in the last 72 hours.  Invalid input(s): "FREET3"  Cardiac Enzymes No results for input(s): "CKMB", "TROPONINI", "MYOGLOBIN" in the last 168 hours.  Invalid input(s): "CK" ------------------------------------------------------------------------------------------------------------------ No results found for: "BNP"  CBG: Recent Labs  Lab 08/13/23 2231  GLUCAP 111*    No results found for this or any previous visit (from the past 240 hours).   Radiology Studies: DG Hip Unilat With Pelvis 2-3 Views Left Result Date: 08/13/2023 CLINICAL DATA:  Status post fall. EXAM: DG HIP (WITH OR WITHOUT PELVIS) 2-3V LEFT COMPARISON:  None Available. FINDINGS: There is an acute fracture deformity  extending through the inter trochanteric region of the proximal left femur. Dorsal angulation of the fracture apex is seen. There is no evidence of dislocation. There is no evidence of significant arthropathy or other focal bone abnormality. IMPRESSION: Acute intertrochanteric fracture of the proximal left femur. Electronically Signed   By: Aram Candela M.D.   On: 08/13/2023 21:49   DG Chest 1 View Result Date: 08/13/2023 CLINICAL DATA:  Status post fall. EXAM: CHEST  1 VIEW COMPARISON:  July 08, 2020 FINDINGS: The heart size and mediastinal contours are within normal limits. Both lungs are clear. A chronic appearing deformity is seen along the greater tubercle of the left humeral head. Multilevel degenerative changes are present throughout the thoracic spine. IMPRESSION: No active cardiopulmonary disease. Electronically Signed   By: Aram Candela M.D.   On: 08/13/2023 21:48     Pamella Pert, MD, PhD [facility]  Between 7 am - 7 pm I am available, please contact me via Amion (for emergencies) or Securechat (non urgent messages)  Between 7 pm - 7 am I am not available, please  contact night coverage MD/APP via Amion

## 2023-08-15 ENCOUNTER — Inpatient Hospital Stay (HOSPITAL_COMMUNITY): Admitting: Anesthesiology

## 2023-08-15 ENCOUNTER — Inpatient Hospital Stay (HOSPITAL_COMMUNITY)

## 2023-08-15 ENCOUNTER — Encounter (HOSPITAL_COMMUNITY)
Admission: EM | Disposition: A | Discharge status: Skilled Nursing Facility | Payer: Self-pay | Source: Home / Self Care | Attending: Internal Medicine

## 2023-08-15 ENCOUNTER — Other Ambulatory Visit: Payer: Self-pay

## 2023-08-15 ENCOUNTER — Encounter (HOSPITAL_COMMUNITY): Payer: Self-pay | Admitting: Internal Medicine

## 2023-08-15 DIAGNOSIS — I10 Essential (primary) hypertension: Secondary | ICD-10-CM | POA: Diagnosis not present

## 2023-08-15 DIAGNOSIS — J45909 Unspecified asthma, uncomplicated: Secondary | ICD-10-CM

## 2023-08-15 DIAGNOSIS — G459 Transient cerebral ischemic attack, unspecified: Secondary | ICD-10-CM

## 2023-08-15 DIAGNOSIS — S72142A Displaced intertrochanteric fracture of left femur, initial encounter for closed fracture: Secondary | ICD-10-CM | POA: Diagnosis not present

## 2023-08-15 DIAGNOSIS — S7292XD Unspecified fracture of left femur, subsequent encounter for closed fracture with routine healing: Secondary | ICD-10-CM | POA: Diagnosis not present

## 2023-08-15 HISTORY — PX: FEMUR IM NAIL: SHX1597

## 2023-08-15 LAB — GLUCOSE, CAPILLARY
Glucose-Capillary: 112 mg/dL — ABNORMAL HIGH (ref 70–99)
Glucose-Capillary: 125 mg/dL — ABNORMAL HIGH (ref 70–99)
Glucose-Capillary: 131 mg/dL — ABNORMAL HIGH (ref 70–99)

## 2023-08-15 LAB — CBC
HCT: 34.2 % — ABNORMAL LOW (ref 36.0–46.0)
Hemoglobin: 10.7 g/dL — ABNORMAL LOW (ref 12.0–15.0)
MCH: 28.9 pg (ref 26.0–34.0)
MCHC: 31.3 g/dL (ref 30.0–36.0)
MCV: 92.4 fL (ref 80.0–100.0)
Platelets: 173 10*3/uL (ref 150–400)
RBC: 3.7 MIL/uL — ABNORMAL LOW (ref 3.87–5.11)
RDW: 14.1 % (ref 11.5–15.5)
WBC: 11.6 10*3/uL — ABNORMAL HIGH (ref 4.0–10.5)
nRBC: 0 % (ref 0.0–0.2)

## 2023-08-15 LAB — COMPREHENSIVE METABOLIC PANEL
ALT: 24 U/L (ref 0–44)
AST: 29 U/L (ref 15–41)
Albumin: 3.2 g/dL — ABNORMAL LOW (ref 3.5–5.0)
Alkaline Phosphatase: 80 U/L (ref 38–126)
Anion gap: 8 (ref 5–15)
BUN: 20 mg/dL (ref 8–23)
CO2: 25 mmol/L (ref 22–32)
Calcium: 8.7 mg/dL — ABNORMAL LOW (ref 8.9–10.3)
Chloride: 105 mmol/L (ref 98–111)
Creatinine, Ser: 0.91 mg/dL (ref 0.44–1.00)
GFR, Estimated: >60 mL/min (ref >60)
Glucose, Bld: 155 mg/dL — ABNORMAL HIGH (ref 70–99)
Potassium: 3.7 mmol/L (ref 3.5–5.1)
Sodium: 138 mmol/L (ref 135–145)
Total Bilirubin: 0.4 mg/dL (ref 0.0–1.2)
Total Protein: 6.5 g/dL (ref 6.5–8.1)

## 2023-08-15 LAB — MAGNESIUM: Magnesium: 2.1 mg/dL (ref 1.7–2.4)

## 2023-08-15 SURGERY — INSERTION, INTRAMEDULLARY ROD, FEMUR
Anesthesia: General | Site: Hip | Laterality: Left

## 2023-08-15 MED ORDER — MENTHOL 3 MG MT LOZG: 1.0000 | LOZENGE | OROMUCOSAL | Status: DC | PRN

## 2023-08-15 MED ORDER — ALUM & MAG HYDROXIDE-SIMETH 200-200-20 MG/5ML PO SUSP: 30.0000 mL | ORAL | Status: DC | PRN

## 2023-08-15 MED ORDER — PHENOL 1.4 % MT LIQD: 1.0000 | OROMUCOSAL | Status: DC | PRN

## 2023-08-15 MED ORDER — 0.9 % SODIUM CHLORIDE (POUR BTL) OPTIME
TOPICAL | Status: DC | PRN
  Administered 2023-08-15: 1000 mL

## 2023-08-15 MED ORDER — ONDANSETRON HCL 4 MG PO TABS: 4.0000 mg | ORAL_TABLET | Freq: Four times a day (QID) | ORAL | Status: DC | PRN

## 2023-08-15 MED ORDER — POLYETHYLENE GLYCOL 3350 17 G PO PACK
17.0000 g | PACK | Freq: Two times a day (BID) | ORAL | Status: DC
  Administered 2023-08-16 – 2023-08-17 (×4): 17 g via ORAL
  Filled 2023-08-15 (×6): qty 1

## 2023-08-15 MED ORDER — FENTANYL CITRATE PF 50 MCG/ML IJ SOSY
PREFILLED_SYRINGE | INTRAMUSCULAR | Status: AC
  Filled 2023-08-15: qty 1

## 2023-08-15 MED ORDER — ONDANSETRON HCL 4 MG/2ML IJ SOLN
INTRAMUSCULAR | Status: AC
  Filled 2023-08-15: qty 2

## 2023-08-15 MED ORDER — OXYCODONE HCL 5 MG PO TABS
2.5000 mg | ORAL_TABLET | ORAL | Status: DC | PRN
Start: 2023-08-15 — End: 2023-08-18
  Administered 2023-08-15: 5 mg via ORAL
  Filled 2023-08-15: qty 1

## 2023-08-15 MED ORDER — POVIDONE-IODINE 10 % EX SWAB: 2.0000 | Freq: Once | CUTANEOUS | Status: DC

## 2023-08-15 MED ORDER — HALOPERIDOL LACTATE 5 MG/ML IJ SOLN
1.0000 mg | Freq: Once | INTRAMUSCULAR | Status: AC
  Administered 2023-08-15: 1 mg via INTRAMUSCULAR
  Filled 2023-08-15: qty 1

## 2023-08-15 MED ORDER — FENTANYL CITRATE PF 50 MCG/ML IJ SOSY
25.0000 ug | PREFILLED_SYRINGE | INTRAMUSCULAR | Status: DC | PRN
  Administered 2023-08-15: 50 ug via INTRAVENOUS

## 2023-08-15 MED ORDER — ASPIRIN 81 MG PO CHEW
81.0000 mg | CHEWABLE_TABLET | Freq: Two times a day (BID) | ORAL | Status: DC
  Administered 2023-08-15 – 2023-08-18 (×6): 81 mg via ORAL
  Filled 2023-08-15 (×6): qty 1

## 2023-08-15 MED ORDER — FENTANYL CITRATE (PF) 100 MCG/2ML IJ SOLN
INTRAMUSCULAR | Status: AC
  Filled 2023-08-15: qty 2

## 2023-08-15 MED ORDER — ROCURONIUM BROMIDE 10 MG/ML (PF) SYRINGE
PREFILLED_SYRINGE | INTRAVENOUS | Status: AC
  Filled 2023-08-15: qty 10

## 2023-08-15 MED ORDER — METOCLOPRAMIDE HCL 5 MG PO TABS: 5.0000 mg | ORAL_TABLET | Freq: Three times a day (TID) | ORAL | Status: DC | PRN

## 2023-08-15 MED ORDER — ROCURONIUM BROMIDE 10 MG/ML (PF) SYRINGE
PREFILLED_SYRINGE | INTRAVENOUS | Status: DC | PRN
  Administered 2023-08-15: 50 mg via INTRAVENOUS

## 2023-08-15 MED ORDER — PROPOFOL 10 MG/ML IV BOLUS
INTRAVENOUS | Status: AC
  Filled 2023-08-15: qty 20

## 2023-08-15 MED ORDER — PHENYLEPHRINE 80 MCG/ML (10ML) SYRINGE FOR IV PUSH (FOR BLOOD PRESSURE SUPPORT)
PREFILLED_SYRINGE | INTRAVENOUS | Status: DC | PRN
  Administered 2023-08-15 (×3): 160 ug via INTRAVENOUS
  Administered 2023-08-15: 240 ug via INTRAVENOUS

## 2023-08-15 MED ORDER — LIDOCAINE HCL (PF) 2 % IJ SOLN
INTRAMUSCULAR | Status: DC | PRN
  Administered 2023-08-15: 40 mg via INTRADERMAL

## 2023-08-15 MED ORDER — LACTATED RINGERS IV SOLN: INTRAVENOUS | Status: DC | PRN

## 2023-08-15 MED ORDER — SENNA 8.6 MG PO TABS
2.0000 | ORAL_TABLET | Freq: Every day | ORAL | Status: DC
  Administered 2023-08-17: 17.2 mg via ORAL
  Filled 2023-08-15 (×2): qty 2

## 2023-08-15 MED ORDER — ACETAMINOPHEN 500 MG PO TABS
1000.0000 mg | ORAL_TABLET | Freq: Four times a day (QID) | ORAL | Status: DC
  Administered 2023-08-15 – 2023-08-18 (×9): 1000 mg via ORAL
  Filled 2023-08-15 (×9): qty 2

## 2023-08-15 MED ORDER — BISACODYL 10 MG RE SUPP
10.0000 mg | Freq: Every day | RECTAL | Status: DC | PRN
  Administered 2023-08-17: 10 mg via RECTAL
  Filled 2023-08-15: qty 1

## 2023-08-15 MED ORDER — DEXAMETHASONE SODIUM PHOSPHATE 10 MG/ML IJ SOLN
10.0000 mg | Freq: Once | INTRAMUSCULAR | Status: AC
  Administered 2023-08-16: 10 mg via INTRAVENOUS
  Filled 2023-08-15: qty 1

## 2023-08-15 MED ORDER — CEFAZOLIN SODIUM-DEXTROSE 2-4 GM/100ML-% IV SOLN
2.0000 g | Freq: Four times a day (QID) | INTRAVENOUS | Status: AC
  Administered 2023-08-15 – 2023-08-16 (×2): 2 g via INTRAVENOUS
  Filled 2023-08-15 (×2): qty 100

## 2023-08-15 MED ORDER — ONDANSETRON HCL 4 MG/2ML IJ SOLN
INTRAMUSCULAR | Status: DC | PRN
  Administered 2023-08-15: 4 mg via INTRAVENOUS

## 2023-08-15 MED ORDER — SODIUM CHLORIDE 0.9% FLUSH: 3.0000 mL | INTRAVENOUS | Status: DC | PRN

## 2023-08-15 MED ORDER — OXYCODONE HCL 5 MG PO TABS: 5.0000 mg | ORAL_TABLET | ORAL | Status: DC | PRN

## 2023-08-15 MED ORDER — ONDANSETRON HCL 4 MG/2ML IJ SOLN
4.0000 mg | Freq: Four times a day (QID) | INTRAMUSCULAR | Status: DC | PRN
  Administered 2023-08-16: 4 mg via INTRAVENOUS
  Filled 2023-08-15: qty 2

## 2023-08-15 MED ORDER — SUGAMMADEX SODIUM 200 MG/2ML IV SOLN
INTRAVENOUS | Status: DC | PRN
  Administered 2023-08-15: 200 mg via INTRAVENOUS

## 2023-08-15 MED ORDER — PROPOFOL 10 MG/ML IV BOLUS
INTRAVENOUS | Status: DC | PRN
  Administered 2023-08-15: 50 mg via INTRAVENOUS

## 2023-08-15 MED ORDER — INSULIN ASPART 100 UNIT/ML IJ SOLN: 0.0000 [IU] | INTRAMUSCULAR | Status: DC | PRN

## 2023-08-15 MED ORDER — ACETAMINOPHEN 500 MG PO TABS
1000.0000 mg | ORAL_TABLET | Freq: Once | ORAL | Status: AC
  Administered 2023-08-15: 1000 mg via ORAL
  Filled 2023-08-15: qty 2

## 2023-08-15 MED ORDER — CEFAZOLIN SODIUM-DEXTROSE 2-4 GM/100ML-% IV SOLN
2.0000 g | INTRAVENOUS | Status: AC
  Administered 2023-08-15: 2 g via INTRAVENOUS
  Filled 2023-08-15: qty 100

## 2023-08-15 MED ORDER — DEXAMETHASONE SODIUM PHOSPHATE 10 MG/ML IJ SOLN
INTRAMUSCULAR | Status: DC | PRN
  Administered 2023-08-15: 8 mg via INTRAVENOUS

## 2023-08-15 MED ORDER — OXYCODONE HCL 5 MG PO TABS: 5.0000 mg | ORAL_TABLET | Freq: Once | ORAL | Status: DC | PRN

## 2023-08-15 MED ORDER — TRANEXAMIC ACID-NACL 1000-0.7 MG/100ML-% IV SOLN
1000.0000 mg | Freq: Once | INTRAVENOUS | Status: AC
  Administered 2023-08-15: 1000 mg via INTRAVENOUS
  Filled 2023-08-15: qty 100

## 2023-08-15 MED ORDER — PHENYLEPHRINE 80 MCG/ML (10ML) SYRINGE FOR IV PUSH (FOR BLOOD PRESSURE SUPPORT)
PREFILLED_SYRINGE | INTRAVENOUS | Status: AC
  Filled 2023-08-15: qty 10

## 2023-08-15 MED ORDER — FENTANYL CITRATE (PF) 100 MCG/2ML IJ SOLN
INTRAMUSCULAR | Status: DC | PRN
Start: 2023-08-15 — End: 2023-08-15
  Administered 2023-08-15: 50 ug via INTRAVENOUS

## 2023-08-15 MED ORDER — HYDROMORPHONE HCL 1 MG/ML IJ SOLN: 0.5000 mg | INTRAMUSCULAR | Status: DC | PRN

## 2023-08-15 MED ORDER — PHENYLEPHRINE HCL-NACL 20-0.9 MG/250ML-% IV SOLN
INTRAVENOUS | Status: DC | PRN
  Administered 2023-08-15: 60 ug/min via INTRAVENOUS

## 2023-08-15 MED ORDER — SUGAMMADEX SODIUM 200 MG/2ML IV SOLN
INTRAVENOUS | Status: AC
  Filled 2023-08-15: qty 2

## 2023-08-15 MED ORDER — OXYCODONE HCL 5 MG/5ML PO SOLN: 5.0000 mg | Freq: Once | ORAL | Status: DC | PRN

## 2023-08-15 MED ORDER — PHENYLEPHRINE HCL-NACL 20-0.9 MG/250ML-% IV SOLN
INTRAVENOUS | Status: AC
  Filled 2023-08-15: qty 250

## 2023-08-15 MED ORDER — LIDOCAINE HCL (PF) 2 % IJ SOLN
INTRAMUSCULAR | Status: AC
  Filled 2023-08-15: qty 5

## 2023-08-15 MED ORDER — SODIUM CHLORIDE 0.9% FLUSH
3.0000 mL | Freq: Two times a day (BID) | INTRAVENOUS | Status: DC
  Administered 2023-08-16 – 2023-08-17 (×4): 10 mL via INTRAVENOUS
  Administered 2023-08-18: 5 mL via INTRAVENOUS

## 2023-08-15 MED ORDER — DIPHENHYDRAMINE HCL 12.5 MG/5ML PO ELIX: 12.5000 mg | ORAL_SOLUTION | ORAL | Status: DC | PRN

## 2023-08-15 MED ORDER — TRANEXAMIC ACID-NACL 1000-0.7 MG/100ML-% IV SOLN
1000.0000 mg | INTRAVENOUS | Status: AC
  Administered 2023-08-15: 1000 mg via INTRAVENOUS
  Filled 2023-08-15: qty 100

## 2023-08-15 MED ORDER — CHLORHEXIDINE GLUCONATE 4 % EX SOLN: 60.0000 mL | Freq: Once | CUTANEOUS | Status: DC

## 2023-08-15 MED ORDER — STERILE WATER FOR IRRIGATION IR SOLN
Status: DC | PRN
  Administered 2023-08-15: 2000 mL

## 2023-08-15 MED ORDER — METHOCARBAMOL 500 MG PO TABS
500.0000 mg | ORAL_TABLET | Freq: Four times a day (QID) | ORAL | Status: DC | PRN
  Administered 2023-08-15 – 2023-08-18 (×9): 500 mg via ORAL
  Filled 2023-08-15 (×8): qty 1

## 2023-08-15 MED ORDER — DEXAMETHASONE SODIUM PHOSPHATE 10 MG/ML IJ SOLN
INTRAMUSCULAR | Status: AC
  Filled 2023-08-15: qty 1

## 2023-08-15 MED ORDER — METHOCARBAMOL 1000 MG/10ML IJ SOLN: 500.0000 mg | Freq: Four times a day (QID) | INTRAMUSCULAR | Status: DC | PRN

## 2023-08-15 MED ORDER — AMISULPRIDE (ANTIEMETIC) 5 MG/2ML IV SOLN: 10.0000 mg | Freq: Once | INTRAVENOUS | Status: DC | PRN

## 2023-08-15 MED ORDER — METOCLOPRAMIDE HCL 5 MG/ML IJ SOLN: 5.0000 mg | Freq: Three times a day (TID) | INTRAMUSCULAR | Status: DC | PRN

## 2023-08-15 SURGICAL SUPPLY — 32 items
BAG COUNTER SPONGE SURGICOUNT (BAG) IMPLANT
BAG ZIPLOCK 12X15 (MISCELLANEOUS) IMPLANT
BIT DRILL CANN LG 4.3MM (BIT) IMPLANT
BNDG COHESIVE 6X5 TAN ST LF (GAUZE/BANDAGES/DRESSINGS) ×1 IMPLANT
BNDG GAUZE DERMACEA FLUFF 4 (GAUZE/BANDAGES/DRESSINGS) ×1 IMPLANT
COVER PERINEAL POST (MISCELLANEOUS) ×1 IMPLANT
COVER SURGICAL LIGHT HANDLE (MISCELLANEOUS) ×1 IMPLANT
DERMABOND ADVANCED .7 DNX12 (GAUZE/BANDAGES/DRESSINGS) IMPLANT
DRAPE STERI IOBAN 125X83 (DRAPES) ×1 IMPLANT
DRILL BIT CANN LG 4.3MM (BIT) ×1 IMPLANT
DRSG AQUACEL AG ADV 3.5X 4 (GAUZE/BANDAGES/DRESSINGS) IMPLANT
DRSG AQUACEL AG ADV 3.5X 6 (GAUZE/BANDAGES/DRESSINGS) ×1 IMPLANT
DURAPREP 26ML APPLICATOR (WOUND CARE) ×1 IMPLANT
ELECT REM PT RETURN 15FT ADLT (MISCELLANEOUS) ×1 IMPLANT
GLOVE BIO SURGEON STRL SZ 6.5 (GLOVE) ×1 IMPLANT
GLOVE BIOGEL PI IND STRL 6.5 (GLOVE) ×1 IMPLANT
GLOVE BIOGEL PI IND STRL 7.5 (GLOVE) ×1 IMPLANT
GLOVE ORTHO TXT STRL SZ7.5 (GLOVE) ×2 IMPLANT
GOWN STRL REUS W/ TWL LRG LVL3 (GOWN DISPOSABLE) ×3 IMPLANT
GUIDEPIN VERSANAIL DSP 3.2X444 (ORTHOPEDIC DISPOSABLE SUPPLIES) IMPLANT
KIT BASIN OR (CUSTOM PROCEDURE TRAY) ×1 IMPLANT
KIT TURNOVER KIT A (KITS) IMPLANT
MANIFOLD NEPTUNE II (INSTRUMENTS) ×1 IMPLANT
NAIL HIP FRACT 130D 11X180 (Screw) IMPLANT
PACK GENERAL/GYN (CUSTOM PROCEDURE TRAY) ×1 IMPLANT
PROTECTOR NERVE ULNAR (MISCELLANEOUS) ×1 IMPLANT
SCREW BONE CORTICAL 5.0X36 (Screw) IMPLANT
SCREW CANN THRD AFF 10.5X100 (Screw) IMPLANT
SUT MNCRL AB 4-0 PS2 18 (SUTURE) IMPLANT
SUT VIC AB 1 CT1 27XBRD ANTBC (SUTURE) ×1 IMPLANT
SUT VIC AB 2-0 CT1 TAPERPNT 27 (SUTURE) ×1 IMPLANT
TOWEL OR 17X26 10 PK STRL BLUE (TOWEL DISPOSABLE) ×1 IMPLANT

## 2023-08-15 NOTE — Plan of Care (Signed)
  Problem: Pain Managment: Goal: General experience of comfort will improve and/or be controlled Outcome: Progressing   Problem: Safety: Goal: Ability to remain free from injury will improve Outcome: Progressing

## 2023-08-15 NOTE — Progress Notes (Signed)
 Patient ID: Jacqueline Foley, female   DOB: 1933-05-19, 89 y.o.   MRN: [id number]  Fall with left hip injury  NPO for surgery today this afternoon around 2-3pm Orders placed for consent  Full note to follow. Plan for IM nail left IT hip fracture

## 2023-08-15 NOTE — Plan of Care (Signed)

## 2023-08-15 NOTE — Progress Notes (Signed)
 Patient ID: Jacqueline Foley, female   DOB: 06/08/1932, 89 y.o.   MRN: [id number]  Jacqueline Foley is an 89 y.o. female.  HPI: fell yesterday at home onto L hip. 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 Had fallen onto the L hip 3 weeks ago and was negative for fx then. Denies other injuries from her fall.  Asked to assist with surgical management of her left hip fracture  Reviewed with her daughter her current living arrangement, general medical health concerns  To OR today for IM nail of left intertrochanteric hip fracture  Post op disposition pending PT eval and recs Closely work with her daughter to assist with short and long term plans

## 2023-08-15 NOTE — Op Note (Unsigned)
 NAME: Jacqueline Foley, Jacqueline Foley MEDICAL RECORD NO: [id number] ACCOUNT NO: [id number] DATE OF BIRTH: [birth year] FACILITY: [facility] LOCATION: [id number] PHYSICIAN: Madlyn Frankel. Charlann Boxer, MD  Operative Report   DATE OF PROCEDURE: 08/15/2023  PREOPERATIVE DIAGNOSIS:  Left comminuted intertrochanteric femur fracture.  POSTOPERATIVE DIAGNOSIS: Left comminuted intertrochanteric femur fracture.  PROCEDURE:  Intramedullary nailing, left intertrochanteric femur fracture.  COMPONENTS USED:  Zimmer Affixus 11 x 180 mm nail with a 130-degree lag screw measuring 100 mm with a distal interlock.  SURGEON:  Madlyn Frankel. Charlann Boxer, MD  ASSISTANT:  Rosalene Billings, PA-C.  Note that PA Domenic Schwab was present for the entirety of the case from preoperative position, perioperative management of the operative extremity, general facilitation of the case, and primary wound closure.  ANESTHESIA:  General.  BLOOD LOSS:  Less than 100 mL.  DRAINS:  None.  COMPLICATIONS:  None.  INDICATIONS FOR THE PROCEDURE:  The patient is an 89 year old female with a history of polio affecting her left lower extremity.  She has unfortunately had a few ground-level falls recently.  The most recent one occurred on 08/13/2023 where she fell onto  her left hip.  She had immediate onset of pain and inability to bear weight.  She was brought to the emergency room where radiographs revealed a comminuted intertrochanteric femur fracture.  She was admitted to the hospital service per protocol and  orthopedics consulted for surgical management.  Risks of nonunion, malunion, infection, and DVT were reviewed with her daughter.  Consent was obtained for the benefit of fracture management and pain relief as well as an improved functional quality of  life.  DESCRIPTION OF PROCEDURE:  The patient was brought to the operative theater.  Once adequate anesthesia, preoperative antibiotics, Ancef administered as well as tranexamic acid.  She was positioned supine.  She was  carefully positioned and padded.  Her  right unaffected extremity was flexed and abducted out of the way with a well leg holder again with all bony prominences padded, particularly laterally over the peroneal nerve.  The left leg had been placed into the traction boot.  Once positioned  against the padded perineal post, traction and internal rotation was applied.  I evaluated the fracture under fluoroscopic imaging identifying the fracture reduced to a near anatomic position.  Given these findings, we then prepped the left hip with a  shower curtain technique.  A timeout was performed identifying the patient, planned procedure, and extremity.  Fluoroscopy was brought back to the field.  The tip of the trochanter was identified.  An incision was made proximal to the lateral trochanter.   Dissection was carried down through the gluteal fascia.  I then inserted a guidewire into the proximal tip of the trochanter across the fracture site and confirmed radiographically.  I then opened up the proximal femur with a drill and then passed an  11 x 180 mm nail by hand.  Once it was passed to its appropriate depth, we used the insertion jig and the guidewire sleeves to insert a guidewire into the center of the head in the AP and lateral planes.  Once this was done, I measured and selected a  100-mm lag screw.  We drilled and then placed a lag screw.  Once the lag screw was in position, we reduced the gross traction off the lower extremity and then used the compression wheel and medialized the shaft of the fracture.  Once I had good  compression, I then locked it down proximally with a locking bolt and backed  it off a quarter of a turn for further compression.  Once this was done, we placed a distal interlock under fluoroscopic imaging measuring 38 mm.  Once this was done, the jig  was removed.  All wounds were irrigated.  Final radiographic picture was taken.  The proximal wound was closed in layers with gluteal fascia  being reapproximated using #1 Vicryl.  The remainder of the wounds were closed in layers using 2-0 Vicryl and  running Monocryl stitch.  All wounds were clean, dry, and dressed sterilely using surgical glue and Aquacel dressing.  The patient was brought to the recovery room in stable condition, tolerating the procedure well.  Postoperatively, she will be partial  weight-bearing.  We will see her back in the office in 2 weeks upon discharge.   PUS D: 08/15/2023 4:17:02 pm T: 08/15/2023 7:36:00 pm  JOB: [id number]/ [id number]

## 2023-08-15 NOTE — Anesthesia Preprocedure Evaluation (Addendum)
 Anesthesia Evaluation  Patient identified by MRN, date of birth, ID band Patient awake    Reviewed: Allergy & Precautions, NPO status , Patient's Chart, lab work & pertinent test results  Airway Mallampati: I  TM Distance: >3 FB Neck ROM: Full    Dental  (+) Edentulous Upper, Dental Advisory Given, Missing,    Pulmonary asthma    Pulmonary exam normal breath sounds clear to auscultation       Cardiovascular hypertension, + DOE  Normal cardiovascular exam Rhythm:Regular Rate:Normal  TTE 2025 1. Left ventricular ejection fraction, by estimation, is 55 to 60%. The  left ventricle has normal function. The left ventricle has no regional  wall motion abnormalities. Left ventricular diastolic parameters are  consistent with Grade I diastolic  dysfunction (impaired relaxation).   2. Right ventricular systolic function is normal. The right ventricular  size is normal. There is normal pulmonary artery systolic pressure. The  estimated right ventricular systolic pressure is 32.6 mmHg.   3. The mitral valve is normal in structure. No evidence of mitral valve  regurgitation. No evidence of mitral stenosis.   4. The aortic valve is tricuspid. There is mild calcification of the  aortic valve. There is moderate thickening of the aortic valve. Aortic  valve regurgitation is not visualized. Aortic valve  sclerosis/calcification is present, without any evidence of  aortic stenosis.   Stress Test 2022  The left ventricular ejection fraction is mildly decreased (45-54%).  Nuclear stress EF: 52%.  There was no ST segment deviation noted during stress.  No T wave inversion was noted during stress.  The study is normal.  This is a low risk study.     Neuro/Psych  PSYCHIATRIC DISORDERS     Dementia TIA   GI/Hepatic Neg liver ROS,GERD  ,,  Endo/Other  diabetes    Renal/GU negative Renal ROS  negative genitourinary    Musculoskeletal negative musculoskeletal ROS (+)    Abdominal   Peds  Hematology negative hematology ROS (+)   Anesthesia Other Findings   Reproductive/Obstetrics                             Anesthesia Physical Anesthesia Plan  ASA: 3  Anesthesia Plan: General   Post-op Pain Management: Tylenol PO (pre-op)*   Induction: Intravenous  PONV Risk Score and Plan: 3 and Dexamethasone, Ondansetron and Treatment may vary due to age or medical condition  Airway Management Planned: Oral ETT  Additional Equipment:   Intra-op Plan:   Post-operative Plan: Extubation in OR  Informed Consent: I have reviewed the patients History and Physical, chart, labs and discussed the procedure including the risks, benefits and alternatives for the proposed anesthesia with the patient or authorized representative who has indicated his/her understanding and acceptance.     Dental advisory given  Plan Discussed with: CRNA  Anesthesia Plan Comments:        Anesthesia Quick Evaluation

## 2023-08-15 NOTE — Discharge Instructions (Signed)

## 2023-08-15 NOTE — Progress Notes (Signed)
 MEWS Progress Note  Patient Details Name: Jacqueline Foley MRN: [id number] DOB: [birth year] Today's Date: 08/15/2023   MEWS Flowsheet Documentation:  Assess: MEWS Score Temp: 99.4 F (37.4 C) BP: 139/89 MAP (mmHg): 102 Pulse Rate: (!) 119 Resp: 19 Level of Consciousness: Alert SpO2: 96 % O2 Device: Room Air Assess: MEWS Score MEWS Temp: 0 MEWS Systolic: 0 MEWS Pulse: 2 MEWS RR: 0 MEWS LOC: 0 MEWS Score: 2 MEWS Score Color: Yellow Assess: SIRS CRITERIA SIRS Temperature : 0 SIRS Respirations : 0 SIRS Pulse: 1 SIRS WBC: 0 SIRS Score Sum : 1 SIRS Temperature : 0 SIRS Pulse: 1 SIRS Respirations : 0 SIRS WBC: 0 SIRS Score Sum : 1 Assess: if the MEWS score is Yellow or Red Were vital signs accurate and taken at a resting state?: Yes Does the patient meet 2 or more of the SIRS criteria?: No MEWS guidelines implemented : Yes, yellow Treat MEWS Interventions: Considered administering scheduled or prn medications/treatments as ordered Take Vital Signs Increase Vital Sign Frequency : Yellow: Q2hr x1, continue Q4hrs until patient remains green for 12hrs Escalate MEWS: Escalate: Yellow: Discuss with charge nurse and consider notifying provider and/or RRT Notify: Charge Nurse/RN Name of Charge Nurse/RN Notified: Salli Quarry RN Provider Notification Provider Name/Title: Johann Capers NP Date Provider Notified: 08/15/23 Time Provider Notified: [phone] Method of Notification: Page (msg sent secure chat) Notification Reason: Other (Comment) (MEWS protocol) Provider response: No new orders Date of Provider Response: 08/15/23 Time of Provider Response: 0700      Annia Belt 08/15/2023, 7:00 AM

## 2023-08-15 NOTE — Plan of Care (Signed)
   Problem: Coping: Goal: Level of anxiety will decrease Outcome: Progressing   Problem: Pain Managment: Goal: General experience of comfort will improve and/or be controlled Outcome: Progressing   Problem: Safety: Goal: Ability to remain free from injury will improve Outcome: Progressing

## 2023-08-15 NOTE — Progress Notes (Signed)
 Patient at this time only oriented to self, keeps asking to get up oob and leave, unable to reorient to situation, IV also removed by patient, secure chat sent to on-call provider requesting something that can be given IM to help calm her down, daughter at bedside and is also requesting the same, awaiting response at this time.

## 2023-08-15 NOTE — Brief Op Note (Signed)
 08/13/2023 - 08/15/2023  3:11 PM  PATIENT:  Jacqueline Foley  89 y.o. female  PRE-OPERATIVE DIAGNOSIS:  LEFT INTERTROCHANTERIC hip FRACTURE  POST-OPERATIVE DIAGNOSIS:  LEFT INTERTROCHANTERIC hip FRACTURE  PROCEDURE:  Procedure(s): INSERTION, INTRAMEDULLARY ROD, FEMUR (Left)  SURGEON:  Surgeons and Role:    Durene Romans, MD - Primary  PHYSICIAN ASSISTANT: Rosalene Billings, PA-C  ANESTHESIA:   general  EBL:  < 100 cc  BLOOD ADMINISTERED:none  DRAINS: none   LOCAL MEDICATIONS USED:  NONE  SPECIMEN:  No Specimen  DISPOSITION OF SPECIMEN:  N/A  COUNTS:  YES  TOURNIQUET:  * No tourniquets in log *  DICTATION: .Other Dictation: Dictation Number [id number]  PLAN OF CARE: Admit to inpatient   PATIENT DISPOSITION:  PACU - hemodynamically stable.   Delay start of Pharmacological VTE agent (>24hrs) due to surgical blood loss or risk of bleeding: no

## 2023-08-15 NOTE — Transfer of Care (Addendum)
 Immediate Anesthesia Transfer of Care Note  Patient: Jacqueline Foley  Procedure(s) Performed: INSERTION, INTRAMEDULLARY ROD, FEMUR (Left: Hip)  Patient Location: PACU  Anesthesia Type:General  Level of Consciousness: awake and alert   Airway & Oxygen Therapy: Patient Spontanous Breathing and Patient connected to face mask oxygen  Post-op Assessment: Report given to RN and Post -op Vital signs reviewed and stable  Post vital signs: Reviewed and stable  Last Vitals:  Vitals Value Taken Time  BP 157/89 08/15/23 1636  Temp 37 C 08/15/23 1636  Pulse 106 08/15/23 1639  Resp 20 08/15/23 1639  SpO2 98 % 08/15/23 1639  Vitals shown include unfiled device data.  Last Pain:  Vitals:   08/15/23 1422  TempSrc: Oral  PainSc:          Complications: No notable events documented.

## 2023-08-15 NOTE — Interval H&P Note (Signed)
 History and Physical Interval Note:  08/15/2023 3:11 PM  Jacqueline Foley  has presented today for surgery, with the diagnosis of LEFT INTERTROCHANTERIC FRACTURE.  The various methods of treatment have been discussed with the patient and family. After consideration of risks, benefits and other options for treatment, the patient has consented to  Procedure(s): INSERTION, INTRAMEDULLARY ROD, FEMUR (Left) as a surgical intervention.  The patient's history has been reviewed, patient examined, no change in status, stable for surgery.  I have reviewed the patient's chart and labs.  Questions were answered to the patient's satisfaction.     Shelda Pal

## 2023-08-15 NOTE — H&P (View-Only) (Signed)
 Patient ID: Jacqueline Foley, female   DOB: 06/08/1932, 89 y.o.   MRN: [id number]  [name] is an 89 y.o. female.  HPI: fell yesterday at home onto L hip. 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 Had fallen onto the L hip 3 weeks ago and was negative for fx then. Denies other injuries from her fall.  Asked to assist with surgical management of her left hip fracture  Reviewed with her daughter her current living arrangement, general medical health concerns  To OR today for IM nail of left intertrochanteric hip fracture  Post op disposition pending PT eval and recs Closely work with her daughter to assist with short and long term plans

## 2023-08-15 NOTE — Progress Notes (Signed)
 OT Cancellation Note  Patient Details Name: Jacqueline Foley MRN: [id number] DOB: Jan 23, 1933   Cancelled Treatment:    Reason Eval/Treat Not Completed: Medical issues which prohibited therapy;Patient at procedure or test/ unavailable Patient is scheduled for IM nail repair this afternoon. OT to continue to follow and check bac on 3/18.  Rosalio Loud, MS Acute Rehabilitation Department Office# [phone]  08/15/2023, 2:06 PM

## 2023-08-15 NOTE — Anesthesia Procedure Notes (Signed)
 Procedure Name: Intubation Date/Time: 08/15/2023 3:32 PM  Performed by: Doran Clay, CRNAPre-anesthesia Checklist: Patient identified, Emergency Drugs available, Suction available, Patient being monitored and Timeout performed Patient Re-evaluated:Patient Re-evaluated prior to induction Oxygen Delivery Method: Circle system utilized Preoxygenation: Pre-oxygenation with 100% oxygen Induction Type: IV induction Ventilation: Mask ventilation without difficulty Laryngoscope Size: Mac and 4 Grade View: Grade I Tube type: Oral Tube size: 7.0 mm Number of attempts: 1 Airway Equipment and Method: Stylet Placement Confirmation: ETT inserted through vocal cords under direct vision, positive ETCO2 and breath sounds checked- equal and bilateral Secured at: 22 cm Tube secured with: Tape Dental Injury: Teeth and Oropharynx as per pre-operative assessment

## 2023-08-15 NOTE — Progress Notes (Signed)
 PROGRESS NOTE  Jacqueline Foley UUV:[id number] DOB: [birth year] DOA: 08/13/2023 PCP: Jackelyn Poling, DO   LOS: 2 days   Brief Narrative / Interim history: 89 year old female with CKD 3A, HTN, HLD, cognitive impairment comes to the hospital with pain after having a fall.  There was no loss of consciousness, it happened in the split-second.  Patient was visiting her daughter's house.  X-ray in the ED showed acute intertrochanteric fracture of the proximal left femur.  Orthopedic surgery was consulted  Subjective / 24h Interval events: Still in pain, confused overnight  Assesement and Plan: Principal Problem:   Femur fracture, left (HCC) Active Problems:   Closed left femoral fracture (HCC)   Fall at home, initial encounter   Acute kidney injury superimposed on CKD (HCC)   Essential hypertension   TIA (transient ischemic attack)   GERD (gastroesophageal reflux disease)   Hyperlipidemia   Dementia without behavioral disturbance (HCC)   Reactive airway disease   Principal problem Left proximal femur intertrochanteric fracture-secondary to fall, orthopedic surgery consulted she will be thinking the OR this afternoon  Active problems CKD 3a -baseline creatinine around 1.0.  Creatinine at baseline  HTN-blood pressure slightly higher today due to pain  Hyperlipidemia-continue statin  Mild dementia-sees neurology as an outpatient  Scheduled Meds:  atorvastatin  20 mg Oral Daily   chlorhexidine  60 mL Topical Once   docusate sodium  100 mg Oral BID   povidone-iodine  2 Application Topical Once   sertraline  50 mg Oral QHS   Continuous Infusions:   ceFAZolin (ANCEF) IV     tranexamic acid     PRN Meds:.acetaminophen, albuterol, hydrALAZINE, HYDROcodone-acetaminophen, morphine injection, ondansetron (ZOFRAN) IV  Current Outpatient Medications  Medication Instructions   albuterol (PROVENTIL HFA;VENTOLIN HFA) 108 (90 Base) MCG/ACT inhaler 2 puffs, Inhalation, Every 6 hours PRN    atorvastatin (LIPITOR) 20 mg, Oral, Every morning   fluticasone (FLONASE) 50 MCG/ACT nasal spray 1 spray, Each Nare, Daily   fluticasone-salmeterol (ADVAIR HFA) 230-21 MCG/ACT inhaler 2 puffs, 2 times daily   nitroGLYCERIN (NITROSTAT) 0.4 mg, Sublingual, Every 5 min PRN   nitroGLYCERIN (NITROSTAT) 0.4 mg, Every 5 min PRN   OVER THE COUNTER MEDICATION 1 tablet, Oral, Daily, Aller-tec 10 mg   sertraline (ZOLOFT) 100 mg, Oral, Nightly   triamterene-hydrochlorothiazide (MAXZIDE-25) 37.5-25 MG tablet 0.5 tablets, Oral, Every morning    Diet Orders (From admission, onward)     Start     Ordered   08/15/23 0703  Diet NPO time specified Except for: Sips with Meds  Diet effective now       Question:  Except for  Answer:  Sips with Meds   08/15/23 0702            DVT prophylaxis: SCDs Start: 08/13/23 2251 Place TED hose Start: 08/13/23 2251   Lab Results  Component Value Date   PLT 173 08/15/2023      Code Status: Full Code  Family Communication: no family at bedside   Status is: Inpatient Remains inpatient appropriate because: severity of illness  Level of care: Med-Surg  Consultants:  Orthopedic surgery   Objective: Vitals:   08/14/23 1906 08/14/23 2153 08/15/23 0625 08/15/23 0739  BP: 120/65 (!) 155/93 139/89 (!) 149/83  Pulse: 99 (!) 103 (!) 119 (!) 118  Resp: 16  19 20   Temp: (!) 97.5 F (36.4 C) 97.9 F (36.6 C) 99.4 F (37.4 C) 98.5 F (36.9 C)  TempSrc: Oral  Oral Oral  SpO2: 99% 98% 96%  96%  Weight:      Height:        Intake/Output Summary (Last 24 hours) at 08/15/2023 1051 Last data filed at 08/15/2023 1610 Gross per 24 hour  Intake 840 ml  Output 800 ml  Net 40 ml   Wt Readings from Last 3 Encounters:  08/14/23 64 kg  07/19/23 61.7 kg  06/10/23 60.8 kg    Examination:  Constitutional: NAD Eyes: lids and conjunctivae normal, no scleral icterus ENMT: mmm Neck: normal, supple Respiratory: clear to auscultation bilaterally, no wheezing, no  crackles.  Cardiovascular: Regular rate and rhythm, no murmurs / rubs / gallops. No LE edema. Abdomen: soft, no distention, no tenderness. Bowel sounds positive.    Data Reviewed: I have independently reviewed following labs and imaging studies   CBC Recent Labs  Lab 08/13/23 2106 08/14/23 0307 08/15/23 0307  WBC 8.0 11.7* 11.6*  HGB 11.9* 11.1* 10.7*  HCT 37.2 36.3 34.2*  PLT 230 222 173  MCV 90.7 93.6 92.4  MCH 29.0 28.6 28.9  MCHC 32.0 30.6 31.3  RDW 13.9 14.0 14.1  LYMPHSABS 1.4  --   --   MONOABS 0.5  --   --   EOSABS 0.1  --   --   BASOSABS 0.0  --   --     Recent Labs  Lab 08/13/23 2106 08/14/23 0307 08/15/23 0307  NA 136 138 138  K 3.7 4.0 3.7  CL 104 103 105  CO2 24 28 25   GLUCOSE 117* 154* 155*  BUN 30* 29* 20  CREATININE 1.03* 1.09* 0.91  CALCIUM 8.6* 8.8* 8.7*  AST  --  26 29  ALT  --  24 24  ALKPHOS  --  87 80  BILITOT  --  0.4 0.4  ALBUMIN  --  3.3* 3.2*  MG  --   --  2.1  INR 0.9 1.1  --     ------------------------------------------------------------------------------------------------------------------ No results for input(s): "CHOL", "HDL", "LDLCALC", "TRIG", "CHOLHDL", "LDLDIRECT" in the last 72 hours.  Lab Results  Component Value Date   HGBA1C 5.7 (H) 12/01/2022   ------------------------------------------------------------------------------------------------------------------ No results for input(s): "TSH", "T4TOTAL", "T3FREE", "THYROIDAB" in the last 72 hours.  Invalid input(s): "FREET3"  Cardiac Enzymes No results for input(s): "CKMB", "TROPONINI", "MYOGLOBIN" in the last 168 hours.  Invalid input(s): "CK" ------------------------------------------------------------------------------------------------------------------ No results found for: "BNP"  CBG: Recent Labs  Lab 08/13/23 2231  GLUCAP 111*    Recent Results (from the past 240 hours)  Surgical pcr screen     Status: None   Collection Time: 08/14/23 10:29 PM    Specimen: Nasal Mucosa; Nasal Swab  Result Value Ref Range Status   MRSA, PCR NEGATIVE NEGATIVE Final   Staphylococcus aureus NEGATIVE NEGATIVE Final    Comment: (NOTE) The Xpert SA Assay (FDA approved for NASAL specimens in patients 73 years of age and older), is one component of a comprehensive surveillance program. It is not intended to diagnose infection nor to guide or monitor treatment. Performed at [facility], [address], [place], [place] [address]      Radiology Studies: CT HIP LEFT WO CONTRAST Result Date: 08/14/2023 CLINICAL DATA:  Hip trauma, fracture suspected EXAM: CT OF THE LEFT HIP WITHOUT CONTRAST TECHNIQUE: Multidetector CT imaging of the left hip was performed according to the standard protocol. Multiplanar CT image reconstructions were also generated. RADIATION DOSE REDUCTION: This exam was performed according to the departmental dose-optimization program which includes automated exposure control, adjustment of the mA  and/or kV according to patient size and/or use of iterative reconstruction technique. COMPARISON:  Same day hip radiographs FINDINGS: Bones/Joint/Cartilage Demineralization. No femoral head dislocation. Mildly displaced comminuted inter trochanteric fracture of the left femur. There is mild superior displacement of the dominant distal fragment. Mild displacement of the lesser trochanter. Ligaments Suboptimally assessed by CT. Muscles and Tendons Small amount of edema/hematoma within the abductor, gluteal and iliopsoas musculature. Soft tissues Soft tissue edema in the proximal left anteriorly and laterally. IMPRESSION: Mildly displaced comminuted intertrochanteric fracture of the left femur. Electronically Signed   By: Minerva Fester M.D.   On: 08/14/2023 21:30     Pamella Pert, MD, PhD [facility]  Between 7 am - 7 pm I am available, please contact me via Amion (for emergencies) or Securechat (non urgent messages)  Between  7 pm - 7 am I am not available, please contact night coverage MD/APP via Amion

## 2023-08-16 ENCOUNTER — Encounter (HOSPITAL_COMMUNITY): Payer: Self-pay | Admitting: Orthopedic Surgery

## 2023-08-16 DIAGNOSIS — S7292XD Unspecified fracture of left femur, subsequent encounter for closed fracture with routine healing: Secondary | ICD-10-CM | POA: Diagnosis not present

## 2023-08-16 LAB — BASIC METABOLIC PANEL
Anion gap: 10 (ref 5–15)
BUN: 24 mg/dL — ABNORMAL HIGH (ref 8–23)
CO2: 26 mmol/L (ref 22–32)
Calcium: 8.7 mg/dL — ABNORMAL LOW (ref 8.9–10.3)
Chloride: 102 mmol/L (ref 98–111)
Creatinine, Ser: 1.02 mg/dL — ABNORMAL HIGH (ref 0.44–1.00)
GFR, Estimated: 53 mL/min — ABNORMAL LOW (ref >60)
Glucose, Bld: 179 mg/dL — ABNORMAL HIGH (ref 70–99)
Potassium: 3.9 mmol/L (ref 3.5–5.1)
Sodium: 138 mmol/L (ref 135–145)

## 2023-08-16 LAB — CBC
HCT: 32.4 % — ABNORMAL LOW (ref 36.0–46.0)
Hemoglobin: 10 g/dL — ABNORMAL LOW (ref 12.0–15.0)
MCH: 28.8 pg (ref 26.0–34.0)
MCHC: 30.9 g/dL (ref 30.0–36.0)
MCV: 93.4 fL (ref 80.0–100.0)
Platelets: 187 10*3/uL (ref 150–400)
RBC: 3.47 MIL/uL — ABNORMAL LOW (ref 3.87–5.11)
RDW: 14.4 % (ref 11.5–15.5)
WBC: 13.6 10*3/uL — ABNORMAL HIGH (ref 4.0–10.5)
nRBC: 0 % (ref 0.0–0.2)

## 2023-08-16 LAB — MAGNESIUM: Magnesium: 2.2 mg/dL (ref 1.7–2.4)

## 2023-08-16 MED ORDER — FLUTICASONE PROPIONATE 50 MCG/ACT NA SUSP
2.0000 | Freq: Every day | NASAL | Status: DC
  Administered 2023-08-16 – 2023-08-18 (×3): 2 via NASAL
  Filled 2023-08-16: qty 16

## 2023-08-16 MED ORDER — LORATADINE 10 MG PO TABS
10.0000 mg | ORAL_TABLET | Freq: Every day | ORAL | Status: DC
  Administered 2023-08-16 – 2023-08-18 (×3): 10 mg via ORAL
  Filled 2023-08-16 (×3): qty 1

## 2023-08-16 NOTE — NC FL2 (Addendum)
 [place] MEDICAID FL2 LEVEL OF CARE FORM     IDENTIFICATION  Patient Name: Jacqueline Foley Birthdate: 12-23-1932 Sex: female Admission Date (Current Location): 08/13/2023  [facility] and [place] Number:  Producer, television/film/video and Address:  [facility],  [address] [place], [place] [address]      Provider Number: [id number]  Attending Physician Name and Address:  Leatha Gilding, MD  Relative Name and Phone Number:  daughter, Thompson Grayer [phone]    Current Level of Care: Hospital Recommended Level of Care: Skilled Nursing Facility Prior Approval Number:    Date Approved/Denied:   PASRR Number: [id number]  Discharge Plan: SNF    Current Diagnoses: Patient Active Problem List   Diagnosis Date Noted   Closed left femoral fracture (HCC) 08/13/2023   Fall at home, initial encounter 08/13/2023   Acute kidney injury superimposed on CKD (HCC) 08/13/2023   TIA (transient ischemic attack) 08/13/2023   GERD (gastroesophageal reflux disease) 08/13/2023   Hyperlipidemia 08/13/2023   Dementia without behavioral disturbance (HCC) 08/13/2023   Reactive airway disease 08/13/2023   Femur fracture, left (HCC) 08/13/2023   Aortic atherosclerosis (HCC) 03/18/2020   History of chest pain 03/19/2019   Chest pain 11/08/2017   Lumbar radiculopathy 05/02/2017   Precordial chest pain 12/06/2013   Chest pain at rest, negative MI, negative stress test, may have been GI 12/06/2013   Essential hypertension 12/06/2013   DOE (dyspnea on exertion), may be due to HTN 12/06/2013    Orientation RESPIRATION BLADDER Height & Weight     Self, Time, Situation, Place  Normal Continent Weight: 141 lb 1.5 oz (64 kg) Height:  5\' 7"  (170.2 cm)  BEHAVIORAL SYMPTOMS/MOOD NEUROLOGICAL BOWEL NUTRITION STATUS      Continent Diet (regular)  AMBULATORY STATUS COMMUNICATION OF NEEDS Skin   Extensive Assist Verbally Other (Comment) (surgical incision only)                       Personal  Care Assistance Level of Assistance  Bathing, Dressing Bathing Assistance: Limited assistance   Dressing Assistance: Limited assistance     Functional Limitations Info  Sight, Hearing, Speech Sight Info: Adequate Hearing Info: Adequate Speech Info: Adequate    SPECIAL CARE FACTORS FREQUENCY  PT (By licensed PT), OT (By licensed OT)     PT Frequency: 5x/wk OT Frequency: 5x/wk            Contractures Contractures Info: Not present    Additional Factors Info  Code Status, Allergies Code Status Info: Full Allergies Info: Tomato, Protonix (Pantoprazole)           Current Medications (08/16/2023):  This is the current hospital active medication list Current Facility-Administered Medications  Medication Dose Route Frequency Provider Last Rate Last Admin   acetaminophen (TYLENOL) tablet 1,000 mg  1,000 mg Oral Q6H Rosalene Billings R, PA-C   1,000 mg at 08/16/23 1454   albuterol (PROVENTIL) (2.5 MG/3ML) 0.083% nebulizer solution 2.5 mg  2.5 mg Nebulization Q6H PRN Cassandria Anger, PA-C       alum & mag hydroxide-simeth (MAALOX/MYLANTA) 200-200-20 MG/5ML suspension 30 mL  30 mL Oral Q4H PRN Cassandria Anger, PA-C       aspirin chewable tablet 81 mg  81 mg Oral BID Cassandria Anger, PA-C   81 mg at 08/16/23 0841   atorvastatin (LIPITOR) tablet 20 mg  20 mg Oral Daily Cassandria Anger, PA-C   20 mg at 08/16/23 0841   bisacodyl (DULCOLAX)  suppository 10 mg  10 mg Rectal Daily PRN Cassandria Anger, PA-C       diphenhydrAMINE (BENADRYL) 12.5 MG/5ML elixir 12.5-25 mg  12.5-25 mg Oral Q4H PRN Cassandria Anger, PA-C       docusate sodium (COLACE) capsule 100 mg  100 mg Oral BID Cassandria Anger, PA-C   100 mg at 08/16/23 0841   fluticasone (FLONASE) 50 MCG/ACT nasal spray 2 spray  2 spray Each Nare Daily Leatha Gilding, MD   2 spray at 08/16/23 1111   hydrALAZINE (APRESOLINE) injection 5 mg  5 mg Intravenous Q6H PRN Cassandria Anger, PA-C       HYDROmorphone (DILAUDID)  injection 0.5 mg  0.5 mg Intravenous Q4H PRN Cassandria Anger, PA-C       loratadine (CLARITIN) tablet 10 mg  10 mg Oral Daily Leatha Gilding, MD   10 mg at 08/16/23 1111   menthol-cetylpyridinium (CEPACOL) lozenge 3 mg  1 lozenge Oral PRN Cassandria Anger, PA-C       Or   phenol (CHLORASEPTIC) mouth spray 1 spray  1 spray Mouth/Throat PRN Cassandria Anger, PA-C       methocarbamol (ROBAXIN) tablet 500 mg  500 mg Oral Q6H PRN Rosalene Billings R, PA-C   500 mg at 08/16/23 1454   Or   methocarbamol (ROBAXIN) injection 500 mg  500 mg Intravenous Q6H PRN Cassandria Anger, PA-C       metoCLOPramide (REGLAN) tablet 5-10 mg  5-10 mg Oral Q8H PRN Cassandria Anger, PA-C       Or   metoCLOPramide (REGLAN) injection 5-10 mg  5-10 mg Intravenous Q8H PRN Cassandria Anger, PA-C       ondansetron (ZOFRAN) tablet 4 mg  4 mg Oral Q6H PRN Cassandria Anger, PA-C       Or   ondansetron [facility]) injection 4 mg  4 mg Intravenous Q6H PRN Cassandria Anger, PA-C       oxyCODONE (Oxy IR/ROXICODONE) immediate release tablet 2.5-5 mg  2.5-5 mg Oral Q4H PRN Cassandria Anger, PA-C   5 mg at 08/15/23 1610   oxyCODONE (Oxy IR/ROXICODONE) immediate release tablet 5-10 mg  5-10 mg Oral Q4H PRN Rosalene Billings R, PA-C       polyethylene glycol (MIRALAX / GLYCOLAX) packet 17 g  17 g Oral BID Cassandria Anger, PA-C   17 g at 08/16/23 9604   senna (SENOKOT) tablet 17.2 mg  2 tablet Oral QHS Rosalene Billings R, PA-C       sertraline (ZOLOFT) tablet 50 mg  50 mg Oral QHS Cassandria Anger, PA-C   50 mg at 08/15/23 2219   sodium chloride flush (NS) 0.9 % injection 3-10 mL  3-10 mL Intravenous Q12H Cassandria Anger, PA-C   10 mL at 08/16/23 5409   sodium chloride flush (NS) 0.9 % injection 3-10 mL  3-10 mL Intravenous PRN Cassandria Anger, PA-C         Discharge Medications: Please see discharge summary for a list of discharge medications.  Relevant Imaging Results:  Relevant Lab Results:   Additional  Information SS# [id number]  Amada Jupiter, LCSW

## 2023-08-16 NOTE — TOC Progression Note (Signed)
 Transition of Care [facility]) - Progression Note    Patient Details  Name: Jacqueline Foley MRN: [id number] Date of Birth: 07-02-1932  Transition of [facility]) CM/SW Contact  Amada Jupiter, LCSW Phone Number: 08/16/2023, 10:15 AM  Clinical Narrative:     Met with pt and daughter, Dennard Nip, this morning to discuss anticipated dc needs.  Again, confirming with both that pt admitted from senior apt at [facility] where she was independent overall.  She had recently begun receiving meals on wheels and family checks in with pt several times per day.  Given her limitations post ortho surgery, both anticipate that pt will need SNF rehab.  At this point, await formal PT evaluation but will begin SNF bed search process.         Expected Discharge Plan and Services                                               Social Determinants of Health (SDOH) Interventions SDOH Screenings   Food Insecurity: No Food Insecurity (08/14/2023)  Housing: Low Risk  (08/14/2023)  Transportation Needs: No Transportation Needs (08/14/2023)  Utilities: Not At Risk (08/14/2023)  Social Connections: Unknown (08/14/2023)  Tobacco Use: Low Risk  (08/15/2023)    Readmission Risk Interventions    08/14/2023    8:41 AM  Readmission Risk Prevention Plan  Transportation Screening Complete  PCP or Specialist Appt within 5-7 Days Complete  Home Care Screening Complete  Medication Review (RN CM) Complete

## 2023-08-16 NOTE — Progress Notes (Signed)
 PROGRESS NOTE  Jacqueline Foley [id number] DOB: 02/15/1933 DOA: 08/13/2023 PCP: Jackelyn Poling, DO   LOS: 3 days   Brief Narrative / Interim history: 89 year old female with CKD 3A, HTN, HLD, cognitive impairment comes to the hospital with pain after having a fall.  There was no loss of consciousness, it happened in the split-second.  Patient was visiting her daughter's house.  X-ray in the ED showed acute intertrochanteric fracture of the proximal left femur.  Orthopedic surgery was consulted  Subjective / 24h Interval events: Sundowning overnight, confused, but much better this morning.  She complains of spasms in her left hip  Assesement and Plan: Principal Problem:   Femur fracture, left (HCC) Active Problems:   Closed left femoral fracture (HCC)   Fall at home, initial encounter   Acute kidney injury superimposed on CKD (HCC)   Essential hypertension   TIA (transient ischemic attack)   GERD (gastroesophageal reflux disease)   Hyperlipidemia   Dementia without behavioral disturbance (HCC)   Reactive airway disease   Principal problem Left proximal femur intertrochanteric fracture-secondary to fall, orthopedic surgery consulted, and patient was taken to the OR on 3/17 by Dr. Charlann Boxer and is status post intramedullary rod insertion -DVT prophylaxis per orthopedic surgery, PT evaluation today, suspect she will need SNF  Active problems CKD 3a -baseline creatinine around 1.0.  Creatinine at baseline this morning  HTN-at home she is on Maxide, on hold perioperatively, blood pressure still acceptable but may need to be resumed by tomorrow  Hyperlipidemia-continue statin  Mild dementia-sees neurology as an outpatient  Scheduled Meds:  acetaminophen  1,000 mg Oral Q6H   aspirin  81 mg Oral BID   atorvastatin  20 mg Oral Daily   docusate sodium  100 mg Oral BID   fluticasone  2 spray Each Nare Daily   loratadine  10 mg Oral Daily   polyethylene glycol  17 g Oral BID   senna   2 tablet Oral QHS   sertraline  50 mg Oral QHS   sodium chloride flush  3-10 mL Intravenous Q12H   Continuous Infusions:   PRN Meds:.albuterol, alum & mag hydroxide-simeth, bisacodyl, diphenhydrAMINE, hydrALAZINE, HYDROmorphone (DILAUDID) injection, menthol-cetylpyridinium **OR** phenol, methocarbamol **OR** methocarbamol (ROBAXIN) injection, metoCLOPramide **OR** metoCLOPramide (REGLAN) injection, ondansetron **OR** ondansetron (ZOFRAN) IV, oxyCODONE, oxyCODONE, sodium chloride flush  Current Outpatient Medications  Medication Instructions   albuterol (PROVENTIL HFA;VENTOLIN HFA) 108 (90 Base) MCG/ACT inhaler 2 puffs, Inhalation, Every 6 hours PRN   atorvastatin (LIPITOR) 20 mg, Oral, Every morning   fluticasone (FLONASE) 50 MCG/ACT nasal spray 1 spray, Each Nare, Daily   fluticasone-salmeterol (ADVAIR HFA) 230-21 MCG/ACT inhaler 2 puffs, 2 times daily   nitroGLYCERIN (NITROSTAT) 0.4 mg, Sublingual, Every 5 min PRN   nitroGLYCERIN (NITROSTAT) 0.4 mg, Every 5 min PRN   OVER THE COUNTER MEDICATION 1 tablet, Oral, Daily, Aller-tec 10 mg   sertraline (ZOLOFT) 100 mg, Oral, Nightly   triamterene-hydrochlorothiazide (MAXZIDE-25) 37.5-25 MG tablet 0.5 tablets, Oral, Every morning    Diet Orders (From admission, onward)     Start     Ordered   08/15/23 1757  Diet regular Room service appropriate? Yes; Fluid consistency: Thin  Diet effective now       Question Answer Comment  Room service appropriate? Yes   Fluid consistency: Thin      08/15/23 1756            DVT prophylaxis: SCDs Start: 08/15/23 1757 Place TED hose Start: 08/15/23 1757 SCDs Start: 08/13/23 2251 Place  TED hose Start: 08/13/23 2251   Lab Results  Component Value Date   PLT 187 08/16/2023      Code Status: Full Code  Family Communication: Daughter present at bedside  Status is: Inpatient Remains inpatient appropriate because: severity of illness  Level of care: Med-Surg  Consultants:  Orthopedic  surgery   Objective: Vitals:   08/15/23 2022 08/16/23 0131 08/16/23 0659 08/16/23 1013  BP: 120/77 135/78 138/87 (!) 147/83  Pulse: (!) 107 (!) 103 (!) 106 (!) 107  Resp: 17 17 17 18   Temp: 97.9 F (36.6 C) 98.7 F (37.1 C) 98.9 F (37.2 C) 98.5 F (36.9 C)  TempSrc: Oral Oral Oral   SpO2: 100% 97% 98% 98%  Weight:      Height:        Intake/Output Summary (Last 24 hours) at 08/16/2023 1033 Last data filed at 08/16/2023 4098 Gross per 24 hour  Intake 1226.25 ml  Output 1050 ml  Net 176.25 ml   Wt Readings from Last 3 Encounters:  08/15/23 64 kg  07/19/23 61.7 kg  06/10/23 60.8 kg    Examination:  Constitutional: NAD Eyes: lids and conjunctivae normal, no scleral icterus ENMT: mmm Neck: normal, supple Respiratory: clear to auscultation bilaterally, no wheezing, no crackles.  Cardiovascular: Regular rate and rhythm, no murmurs / rubs / gallops. No LE edema. Abdomen: soft, no distention, no tenderness. Bowel sounds positive.   Data Reviewed: I have independently reviewed following labs and imaging studies   CBC Recent Labs  Lab 08/13/23 2106 08/14/23 0307 08/15/23 0307 08/16/23 0904  WBC 8.0 11.7* 11.6* 13.6*  HGB 11.9* 11.1* 10.7* 10.0*  HCT 37.2 36.3 34.2* 32.4*  PLT 230 222 173 187  MCV 90.7 93.6 92.4 93.4  MCH 29.0 28.6 28.9 28.8  MCHC 32.0 30.6 31.3 30.9  RDW 13.9 14.0 14.1 14.4  LYMPHSABS 1.4  --   --   --   MONOABS 0.5  --   --   --   EOSABS 0.1  --   --   --   BASOSABS 0.0  --   --   --     Recent Labs  Lab 08/13/23 2106 08/14/23 0307 08/15/23 0307 08/16/23 0829  NA 136 138 138 138  K 3.7 4.0 3.7 3.9  CL 104 103 105 102  CO2 24 28 25 26   GLUCOSE 117* 154* 155* 179*  BUN 30* 29* 20 24*  CREATININE 1.03* 1.09* 0.91 1.02*  CALCIUM 8.6* 8.8* 8.7* 8.7*  AST  --  26 29  --   ALT  --  24 24  --   ALKPHOS  --  87 80  --   BILITOT  --  0.4 0.4  --   ALBUMIN  --  3.3* 3.2*  --   MG  --   --  2.1 2.2  INR 0.9 1.1  --   --      ------------------------------------------------------------------------------------------------------------------ No results for input(s): "CHOL", "HDL", "LDLCALC", "TRIG", "CHOLHDL", "LDLDIRECT" in the last 72 hours.  Lab Results  Component Value Date   HGBA1C 5.7 (H) 12/01/2022   ------------------------------------------------------------------------------------------------------------------ No results for input(s): "TSH", "T4TOTAL", "T3FREE", "THYROIDAB" in the last 72 hours.  Invalid input(s): "FREET3"  Cardiac Enzymes No results for input(s): "CKMB", "TROPONINI", "MYOGLOBIN" in the last 168 hours.  Invalid input(s): "CK" ------------------------------------------------------------------------------------------------------------------ No results found for: "BNP"  CBG: Recent Labs  Lab 08/13/23 2231 08/15/23 1426 08/15/23 1646 08/15/23 1735  GLUCAP 111* 112* 125* 131*    Recent  Results (from the past 240 hours)  Surgical pcr screen     Status: None   Collection Time: 08/14/23 10:29 PM   Specimen: Nasal Mucosa; Nasal Swab  Result Value Ref Range Status   MRSA, PCR NEGATIVE NEGATIVE Final   Staphylococcus aureus NEGATIVE NEGATIVE Final    Comment: (NOTE) The Xpert SA Assay (FDA approved for NASAL specimens in patients 19 years of age and older), is one component of a comprehensive surveillance program. It is not intended to diagnose infection nor to guide or monitor treatment. Performed at [facility], [address], [place], [place] [address]      Radiology Studies: DG HIP UNILAT WITH PELVIS 2-3 VIEWS LEFT Result Date: 08/15/2023 CLINICAL DATA:  ORIF left hip fracture EXAM: DG HIP (WITH OR WITHOUT PELVIS) 2-3V LEFT COMPARISON:  08/13/2023 FINDINGS: Six fluoroscopic images are obtained during performance of the procedure and are provided for interpretation only. Intramedullary rod with proximal dynamic and distal interlocking screw  traverse a an intertrochanteric left hip fracture, with near anatomic alignment. Please refer to operative report. Fluoroscopy time: 44 seconds, 6.6328 mGy IMPRESSION: 1. ORIF intertrochanteric left hip fracture. Near anatomic alignment. Electronically Signed   By: Sharlet Salina M.D.   On: 08/15/2023 20:03   DG C-Arm 1-60 Min-No Report Result Date: 08/15/2023 Fluoroscopy was utilized by the requesting physician.  No radiographic interpretation.     Pamella Pert, MD, PhD [facility]  Between 7 am - 7 pm I am available, please contact me via Amion (for emergencies) or Securechat (non urgent messages)  Between 7 pm - 7 am I am not available, please contact night coverage MD/APP via Amion

## 2023-08-16 NOTE — Anesthesia Postprocedure Evaluation (Signed)
 Anesthesia Post Note  Patient: Jacqueline Foley  Procedure(s) Performed: INSERTION, INTRAMEDULLARY ROD, FEMUR (Left: Hip)     Patient location during evaluation: PACU Anesthesia Type: General Level of consciousness: awake and alert Pain management: pain level controlled Vital Signs Assessment: post-procedure vital signs reviewed and stable Respiratory status: spontaneous breathing, nonlabored ventilation, respiratory function stable and patient connected to nasal cannula oxygen Cardiovascular status: blood pressure returned to baseline and stable Postop Assessment: no apparent nausea or vomiting Anesthetic complications: no  No notable events documented.  Last Vitals:  Vitals:   08/16/23 1013 08/16/23 1417  BP: (!) 147/83 (!) 140/84  Pulse: (!) 107 (!) 109  Resp: 18 18  Temp: 36.9 C 36.8 C  SpO2: 98% 97%    Last Pain:  Vitals:   08/16/23 1417  TempSrc: Oral  PainSc:    Pain Goal:                   [name]

## 2023-08-16 NOTE — Progress Notes (Signed)
 Patient ID: Jacqueline Foley, female   DOB: [birth year], 89 y.o.   MRN: [id number] Subjective: 1 Day Post-Op Procedure(s) (LRB): INSERTION, INTRAMEDULLARY ROD, FEMUR (Left)    Patient with baseline confusion. Her daughter is with her this morning for re-orientation  Objective:   VITALS:   Vitals:   08/16/23 0131 08/16/23 0659  BP: 135/78 138/87  Pulse: (!) 103 (!) 106  Resp: 17 17  Temp: 98.7 F (37.1 C) 98.9 F (37.2 C)  SpO2: 97% 98%    Neurovascular intact Incision: dressing C/D/I - she had pulled one of her dressings off.  I reapplied one this morning  LABS Recent Labs    08/13/23 2106 08/14/23 0307 08/15/23 0307  HGB 11.9* 11.1* 10.7*  HCT 37.2 36.3 34.2*  WBC 8.0 11.7* 11.6*  PLT 230 222 173    Recent Labs    08/13/23 2106 08/14/23 0307 08/15/23 0307  NA 136 138 138  K 3.7 4.0 3.7  BUN 30* 29* 20  CREATININE 1.03* 1.09* 0.91  GLUCOSE 117* 154* 155*    Recent Labs    08/13/23 2106 08/14/23 0307  INR 0.9 1.1     Assessment/Plan: 1 Day Post-Op Procedure(s) (LRB): INSERTION, INTRAMEDULLARY ROD, FEMUR (Left)   Advance diet Up with therapy - PWB LLE as possible recognizing the challenges with her baseline dementia Disposition pending but likely will need SNF.  Her daughter will be active in decision making Would minimize narcotic use rather using tylenol and muscle relaxants to control any pain

## 2023-08-16 NOTE — Evaluation (Signed)
 Physical Therapy Evaluation Patient Details Name: Jacqueline Foley MRN: [id number] DOB: 26-Mar-1933 Today's Date: 08/16/2023  History of Present Illness  Pt is 89 yo female admitted on 08/13/23 after fall and found to have acute intertrochanteric fx of proximal L femur.  Pt s/p IM rod 3/17.  Pt with hx including but not limited to CKD 3A, HTN, HLD, mild dementia (per dtr)  Clinical Impression  Pt admitted with above diagnosis. At baseline, pt resided at ILF [facility]) and was ambulatory with rollator.  She did have hx of falls and was getting HHPT.  Today, pt motivated to work with therapy.  She has pain and spasms in L leg with movement.  She stood with light mod A of 2 but was not able to take steps.  Pt did adhere to PWB (leg was spasming into flexion when standing).  Pt well below her baseline Pt currently with functional limitations due to the deficits listed below (see PT Problem List). Pt will benefit from acute skilled PT to increase their independence and safety with mobility to allow discharge.  Patient will benefit from continued inpatient follow up therapy, <3 hours/day at d/c         If plan is discharge home, recommend the following: A lot of help with walking and/or transfers;A lot of help with bathing/dressing/bathroom   Can travel by private vehicle   No    Equipment Recommendations Wheelchair cushion (measurements PT);Wheelchair (measurements PT)  Recommendations for Other Services       Functional Status Assessment Patient has had a recent decline in their functional status and demonstrates the ability to make significant improvements in function in a reasonable and predictable amount of time.     Precautions / Restrictions Precautions Precautions: Fall Restrictions LLE Weight Bearing Per Provider Order: Partial weight bearing LLE Partial Weight Bearing Percentage or Pounds: 50      Mobility  Bed Mobility Overal bed mobility: Needs Assistance              General bed mobility comments: in chair    Transfers Overall transfer level: Needs assistance Equipment used: Rolling walker (2 wheels) Transfers: Sit to/from Stand Sit to Stand: Mod assist, +2 physical assistance           General transfer comment: Light mod A of 2 to rise and steady; pt doing well keeping weight off L LE (tends to spasm and flexes with pain).  She was not able to take any steps.  Did pivot some on R LE at side of chair. Max cues for pivot and RW use    Ambulation/Gait                  Stairs            Wheelchair Mobility     Tilt Bed    Modified Rankin (Stroke Patients Only)       Balance Overall balance assessment: Needs assistance Sitting-balance support: No upper extremity supported Sitting balance-Leahy Scale: Good     Standing balance support: Bilateral upper extremity supported Standing balance-Leahy Scale: Poor Standing balance comment: RW and min-mod A                             Pertinent Vitals/Pain Pain Assessment Pain Assessment: Faces Faces Pain Scale: Hurts even more Pain Location: L LE with movement Pain Descriptors / Indicators: Spasm Pain Intervention(s): Limited activity within patient's tolerance, Monitored during session, Premedicated before session,  Repositioned, Ice applied    Home Living Family/patient expects to be discharged to:: Private residence Living Arrangements: Alone Available Help at Discharge: Family;Available PRN/intermittently (family local but works) Type of Home: Independent living facility (ILF at [facility]) Home Access: Level entry       Home Layout: One level Home Equipment: Grab bars - tub/shower;Rolling Environmental consultant (2 wheels);Rollator (4 wheels);Cane - quad Additional Comments: Pt does have AFO for L LE but has not recently worn    Prior Function Prior Level of Function : Independent/Modified Independent;History of Falls (last six months)             Mobility  Comments: Was getting HHPT; Started ambulating with rollator to prevent falls ADLs Comments: Independent with ADLs (sponge bathing); assist with IADLs     Extremity/Trunk Assessment   Upper Extremity Assessment Upper Extremity Assessment: Defer to OT evaluation    Lower Extremity Assessment Lower Extremity Assessment: LLE deficits/detail;RLE deficits/detail RLE Deficits / Details: ROM WFL; MMT 5/5 LLE Deficits / Details: 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  Pt having spasms in L LE causing her to flex at times.  Ankle: ROM was [facility] and MMT at least 3/5, not tolerating MMT.  Knee: ROM 0-90, MMT 3/5 not further tested.  Hip: ROM WFL, tending to flex at times, MMT grossly 2/5    Cervical / Trunk Assessment Cervical / Trunk Assessment: Normal  Communication        Cognition Arousal: Alert Behavior During Therapy: WFL for tasks assessed/performed   PT - Cognitive impairments: History of cognitive impairments                       PT - Cognition Comments: Pt alert, oriented to self, place, situation (not date);  Follows commands; pleasant         Cueing       General Comments General comments (skin integrity, edema, etc.): Dtr and son in law present    Exercises     Assessment/Plan    PT Assessment Patient needs continued PT services  PT Problem List Decreased strength;Pain;Decreased range of motion;Decreased activity tolerance;Decreased balance;Decreased mobility;Decreased safety awareness;Decreased knowledge of use of DME;Decreased knowledge of precautions       PT Treatment Interventions DME instruction;Therapeutic exercise;Gait training;Balance training;Neuromuscular re-education;Functional mobility training;Therapeutic activities;Patient/family education;Cognitive remediation;Modalities;Wheelchair mobility training    PT Goals (Current goals can be found in the Care Plan section)  Acute Rehab PT Goals Patient Stated Goal: dtr reports  open to rehab PT Goal Formulation: With patient/family Time For Goal Achievement: 08/30/23 Potential to Achieve Goals: Good    Frequency Min 2X/week     Co-evaluation PT/OT/SLP Co-Evaluation/Treatment: Yes Reason for Co-Treatment: Complexity of the patient's impairments (multi-system involvement);For patient/therapist safety           AM-PAC PT "6 Clicks" Mobility  Outcome Measure Help needed turning from your back to your side while in a flat bed without using bedrails?: A Lot Help needed moving from lying on your back to sitting on the side of a flat bed without using bedrails?: A Lot Help needed moving to and from a bed to a chair (including a wheelchair)?: Total Help needed standing up from a chair using your arms (e.g., wheelchair or bedside chair)?: Total Help needed to walk in hospital room?: Total Help needed climbing 3-5 steps with a railing? : Total 6 Click Score: 8    End of Session Equipment Utilized During Treatment: Gait belt Activity Tolerance: Patient tolerated  treatment well Patient left: in chair;with call bell/phone within reach;with family/visitor present Nurse Communication: Mobility status PT Visit Diagnosis: Other abnormalities of gait and mobility (R26.89);Muscle weakness (generalized) (M62.81)    Time: 4098-1191 PT Time Calculation (min) (ACUTE ONLY): 28 min   Charges:   PT Evaluation $PT Eval Moderate Complexity: 1 Mod   PT General Charges $$ ACUTE PT VISIT: 1 Visit         Anise Salvo, PT Acute Rehab [facility] [phone]   Rayetta Humphrey 08/16/2023, 12:31 PM

## 2023-08-16 NOTE — Progress Notes (Signed)
 Occupational Therapy Evaluation Patient Details Name: Jacqueline Foley MRN: [id number] DOB: August 12, 1932 Today's Date: 08/16/2023   History of Present Illness   Pt is 89 yo female admitted on 08/13/23 after fall and found to have acute intertrochanteric fx of proximal L femur.  Pt s/p IM rod 3/17.  Pt with hx including but not limited to CKD 3A, HTN, HLD, mild dementia (per dtr)     Clinical Impressions PTA, patient resides in ILF with family support intermittently. Patient was receiving HHPT and beginning to amb with rollator when she fell at dtr's home. Patient post op D1 with pain due to spasms, PWB 50 % L LE, strength, balance, activity tolerance and cognitive deficits. Needed + 2 assistance this session for sit to stand with RW from recliner and total A for LB BADL's. Patient will require ongoing Acute OT services to progress functional level. Patient will benefit from continued inpatient follow up therapy, <3 hours/day.       If plan is discharge home, recommend the following:   Two people to help with walking and/or transfers;A lot of help with bathing/dressing/bathroom;Assistance with cooking/housework;Direct supervision/assist for medications management;Direct supervision/assist for financial management;Assist for transportation;Help with stairs or ramp for entrance;Supervision due to cognitive status     Functional Status Assessment   Patient has had a recent decline in their functional status and demonstrates the ability to make significant improvements in function in a reasonable and predictable amount of time.     Equipment Recommendations    (TBA)      Precautions/Restrictions   Precautions Precautions: Fall Restrictions Weight Bearing Restrictions Per Provider Order: Yes LLE Weight Bearing Per Provider Order: Partial weight bearing LLE Partial Weight Bearing Percentage or Pounds: 50     Mobility Bed Mobility Overal bed mobility: Needs Assistance              General bed mobility comments: already up in recliner    Transfers Overall transfer level: Needs assistance Equipment used: Rolling walker (2 wheels) Transfers: Sit to/from Stand Sit to Stand: +2 physical assistance, Mod assist           General transfer comment: Light mod A of 2 to rise and steady; pt doing well keeping weight off L LE (tends to spasm and flexes with pain).  She was not able to take any steps.  Did pivot some on R LE at side of chair. Max cues for pivot and RW use      Balance Overall balance assessment: Needs assistance Sitting-balance support: No upper extremity supported Sitting balance-Leahy Scale: Good     Standing balance support: Bilateral upper extremity supported Standing balance-Leahy Scale: Poor Standing balance comment: RW and min-mod A                           ADL either performed or assessed with clinical judgement   ADL Overall ADL's : Needs assistance/impaired Eating/Feeding: Set up;Sitting   Grooming: Wash/dry hands;Sitting   Upper Body Bathing: Minimal assistance;Sitting   Lower Body Bathing: Total assistance;+2 for physical assistance;+2 for safety/equipment;Sitting/lateral leans;Sit to/from stand   Upper Body Dressing : Minimal assistance;Sitting   Lower Body Dressing: Total assistance;+2 for physical assistance;+2 for safety/equipment;Sit to/from stand;Sitting/lateral leans   Toilet Transfer: Maximal assistance;+2 for physical assistance;+2 for safety/equipment;BSC/3in1   Toileting- Clothing Manipulation and Hygiene:  (Purewick in place with total assist)       Functional mobility during ADLs: +2 for physical assistance;+2 for safety/equipment;Rolling walker (2  wheels) General ADL Comments: PWB L LE with pain and 2 person assist to stand from recliner     Vision Baseline Vision/History: 1 Wears glasses Ability to See in Adequate Light: 0 Adequate Patient Visual Report: No change from baseline Vision  Assessment?: No apparent visual deficits     Perception Perception: Within Functional Limits       Praxis Praxis: WFL       Pertinent Vitals/Pain Pain Assessment Pain Assessment: Faces Faces Pain Scale: Hurts even more Breathing: normal Negative Vocalization: occasional moan/groan, low speech, negative/disapproving quality Facial Expression: smiling or inexpressive Body Language: tense, distressed pacing, fidgeting Consolability: distracted or reassured by voice/touch PAINAD Score: 3 Pain Location: L LE with motion, mobility and standing Pain Descriptors / Indicators: Spasm Pain Intervention(s): Limited activity within patient's tolerance, Monitored during session, Repositioned, Premedicated before session, Ice applied     Extremity/Trunk Assessment Upper Extremity Assessment Upper Extremity Assessment: Defer to OT evaluation   Lower Extremity Assessment Lower Extremity Assessment: LLE deficits/detail;RLE deficits/detail RLE Deficits / Details: ROM WFL; MMT 5/5 LLE Deficits / Details: 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  Pt having spasms in L LE causing her to flex at times.  Ankle: ROM was [facility] and MMT at least 3/5, not tolerating MMT.  Knee: ROM 0-90, MMT 3/5 not further tested.  Hip: ROM WFL, tending to flex at times, MMT grossly 2/5   Cervical / Trunk Assessment Cervical / Trunk Assessment: Normal   Communication Communication Communication: No apparent difficulties   Cognition Arousal: Alert Behavior During Therapy: WFL for tasks assessed/performed Cognition: History of cognitive impairments             OT - Cognition Comments: family report hx of dementia                 Following commands: Intact (1-2 step simple)       Cueing  General Comments   Cueing Techniques: Verbal cues  Dtr and son in law present   Exercises     Shoulder Instructions      Home Living Family/patient expects to be discharged to:: Private  residence Living Arrangements: Alone Available Help at Discharge: Family;Available PRN/intermittently Type of Home: Independent living facility Home Access: Level entry     Home Layout: One level     Bathroom Shower/Tub: Walk-in shower;Sponge bathes at baseline   Bathroom Toilet: Handicapped height Bathroom Accessibility: Yes   Home Equipment: Grab bars - tub/shower;Rolling Walker (2 wheels);Rollator (4 wheels);Cane - quad   Additional Comments: Pt does have AFO for L LE but has not recently worn      Prior Functioning/Environment Prior Level of Function : Independent/Modified Independent;History of Falls (last six months)             Mobility Comments: Was getting HHPT; Started ambulating with rollator to prevent falls ADLs Comments: Independent with ADLs (sponge bathing); assist with IADLs    OT Problem List: Decreased strength;Decreased activity tolerance;Impaired balance (sitting and/or standing);Decreased coordination;Decreased cognition;Decreased safety awareness;Decreased knowledge of use of DME or AE;Decreased knowledge of precautions;Pain   OT Treatment/Interventions: Self-care/ADL training;Therapeutic exercise;Neuromuscular education;Energy conservation;DME and/or AE instruction;Therapeutic activities;Cognitive remediation/compensation;Patient/family education;Balance training      OT Goals(Current goals can be found in the care plan section)   Acute Rehab OT Goals Patient Stated Goal: to stand up OT Goal Formulation: With patient/family Time For Goal Achievement: 08/30/23 Potential to Achieve Goals: Good ADL Goals Pt Will Perform Grooming: with set-up;sitting Pt Will Perform Upper Body Bathing:  with supervision;sitting Pt Will Perform Upper Body Dressing: with set-up;sitting Pt Will Perform Lower Body Dressing: with mod assist;sit to/from stand;sitting/lateral leans Pt Will Transfer to Toilet: bedside commode;stand pivot transfer;with min  assist Additional ADL Goal #1: Pt will follow WB prec for L LE with min cues during ADL's and transfers   OT Frequency:  Min 2X/week    Co-evaluation PT/OT/SLP Co-Evaluation/Treatment: Yes Reason for Co-Treatment: Complexity of the patient's impairments (multi-system involvement);For patient/therapist safety PT goals addressed during session: Balance;Mobility/safety with mobility;Proper use of DME;Strengthening/ROM OT goals addressed during session: ADL's and self-care;Proper use of Adaptive equipment and DME      AM-PAC OT "6 Clicks" Daily Activity     Outcome Measure Help from another person eating meals?: A Little Help from another person taking care of personal grooming?: A Little Help from another person toileting, which includes using toliet, bedpan, or urinal?: Total Help from another person bathing (including washing, rinsing, drying)?: A Lot Help from another person to put on and taking off regular upper body clothing?: A Little Help from another person to put on and taking off regular lower body clothing?: Total 6 Click Score: 13   End of Session Equipment Utilized During Treatment: Gait belt;Rolling walker (2 wheels) Nurse Communication: Mobility status;Precautions;Weight bearing status  Activity Tolerance: Patient limited by pain Patient left: in chair;with chair alarm set;with family/visitor present;with call bell/phone within reach;with SCD's reapplied  OT Visit Diagnosis: Unsteadiness on feet (R26.81);Other abnormalities of gait and mobility (R26.89);History of falling (Z91.81);Muscle weakness (generalized) (M62.81);Other symptoms and signs involving cognitive function;Pain Pain - Right/Left: Left Pain - part of body: Hip                Time: 7564-3329 OT Time Calculation (min): 32 min Charges:  OT General Charges $OT Visit: 1 Visit OT Evaluation $OT Eval Moderate Complexity: 1 Mod OT Treatments $Self Care/Home Management : 8-22 mins  Alda Gaultney OT/L Acute  Rehabilitation Department  [phone]   08/16/2023, 1:28 PM

## 2023-08-16 NOTE — Progress Notes (Signed)
 Patient sleeping soundly at this time, morning labwork rescheduled as it took her a while to fall asleep, she remains oriented to self only overnight, daughter at bedside supportive, will continue to monitor.

## 2023-08-16 NOTE — Progress Notes (Signed)
 30 Day PASRR Note   Patient Details  Name: Delbra Zellars Date of Birth: 04/07/1933   Transition of Care [facility]) CM/SW Contact:    Amada Jupiter, LCSW Phone Number: 08/16/2023, 10:31 AM  To Whom It May Concern:  Please be advised that this patient will require a short-term nursing home stay - anticipated 30 days or less for rehabilitation and strengthening.   The plan is for return home.

## 2023-08-17 DIAGNOSIS — S7292XD Unspecified fracture of left femur, subsequent encounter for closed fracture with routine healing: Secondary | ICD-10-CM | POA: Diagnosis not present

## 2023-08-17 LAB — COMPREHENSIVE METABOLIC PANEL
ALT: 13 U/L (ref 0–44)
AST: 23 U/L (ref 15–41)
Albumin: 2.6 g/dL — ABNORMAL LOW (ref 3.5–5.0)
Alkaline Phosphatase: 53 U/L (ref 38–126)
Anion gap: 9 (ref 5–15)
BUN: 25 mg/dL — ABNORMAL HIGH (ref 8–23)
CO2: 26 mmol/L (ref 22–32)
Calcium: 8.3 mg/dL — ABNORMAL LOW (ref 8.9–10.3)
Chloride: 102 mmol/L (ref 98–111)
Creatinine, Ser: 0.94 mg/dL (ref 0.44–1.00)
GFR, Estimated: 58 mL/min — ABNORMAL LOW (ref >60)
Glucose, Bld: 121 mg/dL — ABNORMAL HIGH (ref 70–99)
Potassium: 3.7 mmol/L (ref 3.5–5.1)
Sodium: 137 mmol/L (ref 135–145)
Total Bilirubin: 0.8 mg/dL (ref 0.0–1.2)
Total Protein: 6 g/dL — ABNORMAL LOW (ref 6.5–8.1)

## 2023-08-17 LAB — CBC
HCT: 29.2 % — ABNORMAL LOW (ref 36.0–46.0)
Hemoglobin: 9.2 g/dL — ABNORMAL LOW (ref 12.0–15.0)
MCH: 28.9 pg (ref 26.0–34.0)
MCHC: 31.5 g/dL (ref 30.0–36.0)
MCV: 91.8 fL (ref 80.0–100.0)
Platelets: 195 10*3/uL (ref 150–400)
RBC: 3.18 MIL/uL — ABNORMAL LOW (ref 3.87–5.11)
RDW: 14.5 % (ref 11.5–15.5)
WBC: 12.8 10*3/uL — ABNORMAL HIGH (ref 4.0–10.5)
nRBC: 0 % (ref 0.0–0.2)

## 2023-08-17 LAB — MAGNESIUM: Magnesium: 2.3 mg/dL (ref 1.7–2.4)

## 2023-08-17 MED ORDER — SENNA 8.6 MG PO TABS: 2.0000 | ORAL_TABLET | Freq: Every day | ORAL | 0 refills | Status: DC

## 2023-08-17 MED ORDER — METHOCARBAMOL 500 MG PO TABS: 500.0000 mg | ORAL_TABLET | Freq: Four times a day (QID) | ORAL | 2 refills | Status: DC | PRN

## 2023-08-17 MED ORDER — METHOCARBAMOL 500 MG PO TABS: 500.0000 mg | ORAL_TABLET | Freq: Four times a day (QID) | ORAL | 2 refills | Status: AC | PRN

## 2023-08-17 MED ORDER — ASPIRIN 81 MG PO CHEW: 81.0000 mg | CHEWABLE_TABLET | Freq: Two times a day (BID) | ORAL | 0 refills | Status: AC

## 2023-08-17 MED ORDER — TRIAMTERENE-HCTZ 37.5-25 MG PO TABS
0.5000 | ORAL_TABLET | Freq: Every day | ORAL | Status: DC
  Administered 2023-08-17 – 2023-08-18 (×2): 0.5 via ORAL
  Filled 2023-08-17 (×3): qty 1

## 2023-08-17 MED ORDER — POLYETHYLENE GLYCOL 3350 17 G PO PACK: 17.0000 g | PACK | Freq: Two times a day (BID) | ORAL | 0 refills | Status: AC

## 2023-08-17 MED ORDER — ASPIRIN 81 MG PO CHEW: 81.0000 mg | CHEWABLE_TABLET | Freq: Two times a day (BID) | ORAL | 0 refills | Status: DC

## 2023-08-17 MED ORDER — OXYCODONE HCL 5 MG PO TABS: 2.5000 mg | ORAL_TABLET | ORAL | 0 refills | Status: DC | PRN

## 2023-08-17 MED ORDER — POLYETHYLENE GLYCOL 3350 17 G PO PACK: 17.0000 g | PACK | Freq: Two times a day (BID) | ORAL | 0 refills | Status: DC

## 2023-08-17 MED ORDER — KETOROLAC TROMETHAMINE 15 MG/ML IJ SOLN
15.0000 mg | Freq: Four times a day (QID) | INTRAMUSCULAR | Status: DC | PRN
  Administered 2023-08-17 – 2023-08-18 (×3): 15 mg via INTRAVENOUS
  Filled 2023-08-17 (×3): qty 1

## 2023-08-17 MED ORDER — SENNA 8.6 MG PO TABS: 2.0000 | ORAL_TABLET | Freq: Every day | ORAL | 0 refills | Status: AC

## 2023-08-17 MED ORDER — MOMETASONE FURO-FORMOTEROL FUM 200-5 MCG/ACT IN AERO
2.0000 | INHALATION_SPRAY | Freq: Two times a day (BID) | RESPIRATORY_TRACT | Status: DC
  Administered 2023-08-17 – 2023-08-18 (×2): 2 via RESPIRATORY_TRACT
  Filled 2023-08-17: qty 8.8

## 2023-08-17 NOTE — Progress Notes (Signed)
 Physical Therapy Treatment Patient Details Name: Jacqueline Foley MRN: [id number] DOB: 1933-02-07 Today's Date: 08/17/2023   History of Present Illness Pt is 89 yo female admitted on 08/13/23 after fall and found to have acute intertrochanteric fx of proximal L femur.  Pt s/p IM rod 3/17.  Pt with hx including but not limited to CKD 3A, HTN, HLD, mild dementia (per dtr)    PT Comments  POD # 2 PT - Cognition Comments: Pt alert, oriented to self, place, situation (not date);  Follows commands; pleasant.  Very supportive Daughter at bed side.   Pt pre medicated for session.  Avoiding Narcotics due to cognition/affect.  Assisted OOB to [facility].  General bed mobility comments: Required Mod Assist and increased time with repeat VC's to decrease anxiety. Assisted from elevated bed to [facility] 1/4 turn NO AD with VC's on proper hand transfer.  General transfer comment: Required Mod Assist + 2 to stand from [facility] onto B platform EVA walker. General Gait Details: using B plaform EVA walker for increased support and ensure PWBing, pt was able to amb 12 feet + 2 Mod Assist with recliner following behind.  Excessive lean on walker and little to no WBing through L LE.  Pt was pretty much "hopping" to avoid placing foot on floor due to fear of increased pain. Performed a few TE's followed by ICE. Pt will need ST Rehab at SNF to address mobility and functional decline prior to safely returning home.    If plan is discharge home, recommend the following: A lot of help with walking and/or transfers;A lot of help with bathing/dressing/bathroom   Can travel by private vehicle     No  Equipment Recommendations  Wheelchair cushion (measurements PT);Wheelchair (measurements PT)    Recommendations for Other Services       Precautions / Restrictions Precautions Precautions: Fall Restrictions Weight Bearing Restrictions Per Provider Order: Yes LLE Weight Bearing Per Provider Order: Partial weight bearing LLE Partial  Weight Bearing Percentage or Pounds: 50%     Mobility  Bed Mobility Overal bed mobility: Needs Assistance Bed Mobility: Supine to Sit     Supine to sit: Mod assist     General bed mobility comments: Required Mod Assist and increased time with repeat VC's to decrease anxiety.    Transfers Overall transfer level: Needs assistance Equipment used: Rolling walker (2 wheels) Transfers: Sit to/from Stand Sit to Stand: Mod assist, +2 physical assistance           General transfer comment: Required Mod Assist + 2 to stand from elevated bed onto B platform EVA walker.    Ambulation/Gait Ambulation/Gait assistance: Max assist, +2 physical assistance, +2 safety/equipment Gait Distance (Feet): 12 Feet Assistive device: Fara Boros Gait Pattern/deviations: Step-to pattern Gait velocity: decreased     General Gait Details: using B plaform EVA walker for increased support and ensure PWBing, pt was able to amb 12 feet + 2 Mod Assist with recliner following behind.  Excessive lean on walker and little to no WBing through L LE.  Pt was pretty much "hopping" to avoid placing foot on floor due to fear of increased pain.   Stairs             Wheelchair Mobility     Tilt Bed    Modified Rankin (Stroke Patients Only)       Balance  Communication Communication Communication: No apparent difficulties  Cognition Arousal: Alert Behavior During Therapy: WFL for tasks assessed/performed   PT - Cognitive impairments: History of cognitive impairments                       PT - Cognition Comments: Pt alert, oriented to self, place, situation (not date);  Follows commands; pleasant Following commands: Intact      Cueing Cueing Techniques: Verbal cues  Exercises  10 reps AP 10 reps knee presses 10 reps HS 10 reps SAQ's    General Comments        Pertinent Vitals/Pain Pain Assessment Pain  Assessment: Faces Faces Pain Scale: Hurts little more Pain Location: L LE with movement Pain Descriptors / Indicators: Spasm, Grimacing, Operative site guarding Pain Intervention(s): Monitored during session, Premedicated before session, Repositioned, Ice applied    Home Living                          Prior Function            PT Goals (current goals can now be found in the care plan section) Progress towards PT goals: Progressing toward goals    Frequency    Min 2X/week      PT Plan      Co-evaluation              AM-PAC PT "6 Clicks" Mobility   Outcome Measure  Help needed turning from your back to your side while in a flat bed without using bedrails?: A Lot Help needed moving from lying on your back to sitting on the side of a flat bed without using bedrails?: A Lot Help needed moving to and from a bed to a chair (including a wheelchair)?: A Lot Help needed standing up from a chair using your arms (e.g., wheelchair or bedside chair)?: A Lot Help needed to walk in hospital room?: A Lot Help needed climbing 3-5 steps with a railing? : Total 6 Click Score: 11    End of Session Equipment Utilized During Treatment: Gait belt Activity Tolerance: Patient tolerated treatment well Patient left: in chair;with call bell/phone within reach;with family/visitor present Nurse Communication: Mobility status PT Visit Diagnosis: Other abnormalities of gait and mobility (R26.89);Muscle weakness (generalized) (M62.81)     Time: 1245-1310 PT Time Calculation (min) (ACUTE ONLY): 25 min  Charges:    $Gait Training: 8-22 mins $Therapeutic Activity: 8-22 mins PT General Charges $$ ACUTE PT VISIT: 1 Visit                     Felecia Shelling  PTA Acute  Rehabilitation Services Office M-F          [phone]

## 2023-08-17 NOTE — Progress Notes (Signed)
   08/17/23 1532  Spiritual Encounters  Type of Visit Initial  Care provided to: Pt and family  Reason for visit Advance directives  OnCall Visit No   Provided HCPOA education to patient and daughter, forms completed, however daughter will discuss with siblings before processing.

## 2023-08-17 NOTE — TOC Progression Note (Signed)
 Transition of Care [facility]) - Progression Note    Patient Details  Name: Jacqueline Foley MRN: [id number] Date of Birth: 12-Nov-1932  Transition of Care [facility]) CM/SW Contact  Amada Jupiter, LCSW Phone Number: 08/17/2023, 4:38 PM  Clinical Narrative:     Have reviewed SNF bed offers with pt and daughter and they have chosen bed at [facility].  Per MD, pt should be medically ready for dc tomorrow.       Expected Discharge Plan and Services                                               Social Determinants of Health (SDOH) Interventions SDOH Screenings   Food Insecurity: No Food Insecurity (08/14/2023)  Housing: Low Risk  (08/14/2023)  Transportation Needs: No Transportation Needs (08/14/2023)  Utilities: Not At Risk (08/14/2023)  Social Connections: Unknown (08/14/2023)  Tobacco Use: Low Risk  (08/15/2023)    Readmission Risk Interventions    08/14/2023    8:41 AM  Readmission Risk Prevention Plan  Transportation Screening Complete  PCP or Specialist Appt within 5-7 Days Complete  Home Care Screening Complete  Medication Review (RN CM) Complete

## 2023-08-17 NOTE — Progress Notes (Addendum)
 PROGRESS NOTE    Jacqueline Foley  LKG:[id number] DOB: Mar 31, 1933 DOA: 08/13/2023 PCP: Jackelyn Poling, DO     Brief Narrative:  Jacqueline Foley is an 89 year old female with CKD 3A, HTN, HLD, cognitive impairment comes to the hospital with pain after having a fall.  There was no loss of consciousness, it happened in the split-second.  Patient was visiting her daughter's house.  X-ray in the ED showed acute intertrochanteric fracture of the proximal left femur.  Orthopedic surgery was consulted.  Patient underwent IM nailing by Dr. Charlann Boxer 08/15/23.   New events last 24 hours / Subjective: Patient seen, daughter at bedside.  She only worked with physical therapy briefly yesterday, daughter states that was limited due to pain.  Awaiting reevaluation with PT and OT this afternoon.  Has not had a bowel movement.  Daughter is apprehensive about discharge today  Assessment & Plan:   Principal Problem:   Femur fracture, left [facility]) Active Problems:   Closed left femoral fracture (HCC)   Fall at home, initial encounter   Acute kidney injury superimposed on CKD (HCC)   Essential hypertension   TIA (transient ischemic attack)   GERD (gastroesophageal reflux disease)   Hyperlipidemia   Dementia without behavioral disturbance (HCC)   Reactive airway disease   Left proximal fever intertrochanteric fracture secondary to fall -Status post IM nailing by Dr. Charlann Boxer 08/15/2023 -PT evaluation recommending SNF placement  CKD stage IIIa -Baseline creatinine around 1 -Stable  Asymptomatic pyuria -Stable   Hypertension -Maxide resumed   Hyperlipidemia -Lipitor  Mild dementia -Followed by neurology outpatient  DVT prophylaxis:  SCDs Start: 08/15/23 1757 Place TED hose Start: 08/15/23 1757  Code Status: Full Family Communication: Daughter at bedside  Disposition Plan: SNF Status is: Inpatient Remains inpatient appropriate because: Continue PT efforts, SNF placement tmrw      Antimicrobials:  Anti-infectives (From admission, onward)    Start     Dose/Rate Route Frequency Ordered Stop   08/15/23 2130  ceFAZolin (ANCEF) IVPB 2g/100 mL premix        2 g 200 mL/hr over 30 Minutes Intravenous Every 6 hours 08/15/23 1756 08/16/23 1111   08/15/23 0930  ceFAZolin (ANCEF) IVPB 2g/100 mL premix        2 g 200 mL/hr over 30 Minutes Intravenous On call to O.R. 08/15/23 0836 08/15/23 1533        Objective: Vitals:   08/16/23 1013 08/16/23 1417 08/16/23 2212 08/17/23 0526  BP: (!) 147/83 (!) 140/84 (!) 135/99 138/83  Pulse: (!) 107 (!) 109 (!) 108 (!) 104  Resp: 18 18 17 17   Temp: 98.5 F (36.9 C) 98.2 F (36.8 C) 98.3 F (36.8 C) 98.2 F (36.8 C)  TempSrc:  Oral Oral Oral  SpO2: 98% 97% 99% 97%  Weight:      Height:        Intake/Output Summary (Last 24 hours) at 08/17/2023 1248 Last data filed at 08/17/2023 1100 Gross per 24 hour  Intake 180 ml  Output 1550 ml  Net -1370 ml   Filed Weights   08/14/23 0157 08/15/23 1431  Weight: 64 kg 64 kg    Examination:  General exam: Appears calm and comfortable  Respiratory system: Respiratory effort normal. No respiratory distress. No conversational dyspnea.  Central nervous system: Alert  Extremities: Symmetric in appearance   Data Reviewed: I have personally reviewed following labs and imaging studies  CBC: Recent Labs  Lab 08/13/23 2106 08/14/23 0307 08/15/23 0307 08/16/23 0904 08/17/23 0706  WBC  8.0 11.7* 11.6* 13.6* 12.8*  NEUTROABS 6.0  --   --   --   --   HGB 11.9* 11.1* 10.7* 10.0* 9.2*  HCT 37.2 36.3 34.2* 32.4* 29.2*  MCV 90.7 93.6 92.4 93.4 91.8  PLT 230 222 173 187 195   Basic Metabolic Panel: Recent Labs  Lab 08/13/23 2106 08/14/23 0307 08/15/23 0307 08/16/23 0829 08/17/23 0706  NA 136 138 138 138 137  K 3.7 4.0 3.7 3.9 3.7  CL 104 103 105 102 102  CO2 24 28 25 26 26   GLUCOSE 117* 154* 155* 179* 121*  BUN 30* 29* 20 24* 25*  CREATININE 1.03* 1.09* 0.91 1.02* 0.94   CALCIUM 8.6* 8.8* 8.7* 8.7* 8.3*  MG  --   --  2.1 2.2 2.3   GFR: Estimated Creatinine Clearance: 40.2 mL/min (by C-G formula based on SCr of 0.94 mg/dL). Liver Function Tests: Recent Labs  Lab 08/14/23 0307 08/15/23 0307 08/17/23 0706  AST 26 29 23   ALT 24 24 13   ALKPHOS 87 80 53  BILITOT 0.4 0.4 0.8  PROT 6.8 6.5 6.0*  ALBUMIN 3.3* 3.2* 2.6*   No results for input(s): "LIPASE", "AMYLASE" in the last 168 hours. No results for input(s): "AMMONIA" in the last 168 hours. Coagulation Profile: Recent Labs  Lab 08/13/23 2106 08/14/23 0307  INR 0.9 1.1   Cardiac Enzymes: No results for input(s): "CKTOTAL", "CKMB", "CKMBINDEX", "TROPONINI" in the last 168 hours. BNP (last 3 results) No results for input(s): "PROBNP" in the last 8760 hours. HbA1C: No results for input(s): "HGBA1C" in the last 72 hours. CBG: Recent Labs  Lab 08/13/23 2231 08/15/23 1426 08/15/23 1646 08/15/23 1735  GLUCAP 111* 112* 125* 131*   Lipid Profile: No results for input(s): "CHOL", "HDL", "LDLCALC", "TRIG", "CHOLHDL", "LDLDIRECT" in the last 72 hours. Thyroid Function Tests: No results for input(s): "TSH", "T4TOTAL", "FREET4", "T3FREE", "THYROIDAB" in the last 72 hours. Anemia Panel: No results for input(s): "VITAMINB12", "FOLATE", "FERRITIN", "TIBC", "IRON", "RETICCTPCT" in the last 72 hours. Sepsis Labs: No results for input(s): "PROCALCITON", "LATICACIDVEN" in the last 168 hours.  Recent Results (from the past 240 hours)  Surgical pcr screen     Status: None   Collection Time: 08/14/23 10:29 PM   Specimen: Nasal Mucosa; Nasal Swab  Result Value Ref Range Status   MRSA, PCR NEGATIVE NEGATIVE Final   Staphylococcus aureus NEGATIVE NEGATIVE Final    Comment: (NOTE) The Xpert SA Assay (FDA approved for NASAL specimens in patients 6 years of age and older), is one component of a comprehensive surveillance program. It is not intended to diagnose infection nor to guide or monitor  treatment. Performed at [facility], [address] [place], [place] [address]       Radiology Studies: DG HIP UNILAT WITH PELVIS 2-3 VIEWS LEFT Result Date: 08/15/2023 CLINICAL DATA:  ORIF left hip fracture EXAM: DG HIP (WITH OR WITHOUT PELVIS) 2-3V LEFT COMPARISON:  08/13/2023 FINDINGS: Six fluoroscopic images are obtained during performance of the procedure and are provided for interpretation only. Intramedullary rod with proximal dynamic and distal interlocking screw traverse a an intertrochanteric left hip fracture, with near anatomic alignment. Please refer to operative report. Fluoroscopy time: 44 seconds, 6.6328 mGy IMPRESSION: 1. ORIF intertrochanteric left hip fracture. Near anatomic alignment. Electronically Signed   By: Sharlet Salina M.D.   On: 08/15/2023 20:03   DG C-Arm 1-60 Min-No Report Result Date: 08/15/2023 Fluoroscopy was utilized by the requesting physician.  No radiographic interpretation.  Scheduled Meds:  acetaminophen  1,000 mg Oral Q6H   aspirin  81 mg Oral BID   atorvastatin  20 mg Oral Daily   docusate sodium  100 mg Oral BID   fluticasone  2 spray Each Nare Daily   loratadine  10 mg Oral Daily   polyethylene glycol  17 g Oral BID   senna  2 tablet Oral QHS   sertraline  50 mg Oral QHS   sodium chloride flush  3-10 mL Intravenous Q12H   Continuous Infusions:   LOS: 4 days   Time spent: 25 minutes   Noralee Stain, DO [facility] 08/17/2023, 12:48 PM   Available via Epic secure chat 7am-7pm After these hours, please refer to coverage provider listed on [email or web address]

## 2023-08-18 DIAGNOSIS — S7292XD Unspecified fracture of left femur, subsequent encounter for closed fracture with routine healing: Secondary | ICD-10-CM | POA: Diagnosis not present

## 2023-08-18 MED ORDER — SERTRALINE HCL 100 MG PO TABS: 50.0000 mg | ORAL_TABLET | Freq: Every evening | ORAL | Status: DC

## 2023-08-18 MED ORDER — TRAMADOL HCL 50 MG PO TABS: 50.0000 mg | ORAL_TABLET | Freq: Four times a day (QID) | ORAL | 0 refills | Status: DC | PRN

## 2023-08-18 NOTE — Progress Notes (Signed)
   08/18/23 1114  Spiritual Encounters  Type of Visit Follow up  Care provided to: Pt and family  Reason for visit Advance directives  OnCall Visit No   Forms for AD have been completed. Need notary and witnesses.

## 2023-08-18 NOTE — Plan of Care (Signed)
°  Problem: Coping: Goal: Level of anxiety will decrease Outcome: Progressing   Problem: Safety: Goal: Ability to remain free from injury will improve Outcome: Progressing   Problem: Education: Goal: Knowledge of the prescribed therapeutic regimen will improve Outcome: Progressing   Problem: Pain Management: Goal: Pain level will decrease with appropriate interventions Outcome: Progressing

## 2023-08-18 NOTE — Discharge Summary (Signed)
 Physician Discharge Summary  Jacqueline Foley ZOX:[id number] DOB: 06/24/1932 DOA: 08/13/2023  PCP: Jackelyn Poling, DO  Admit date: 08/13/2023 Discharge date: 08/18/2023  Admitted From: Home Disposition:  SNF  Recommendations for Outpatient Follow-up:  Follow up with PCP  Follow-up with orthopedic surgery in 2 weeks, Dr. Charlann Boxer  Discharge Condition: Stable CODE STATUS: Full code Diet recommendation: Regular diet  Brief/Interim Summary: Jacqueline Foley is an 89 year old female with CKD 3A, HTN, HLD, cognitive impairment comes to the hospital with pain after having a fall.  There was no loss of consciousness, it happened in the split-second.  Patient was visiting her daughter's house.  X-ray in the ED showed acute intertrochanteric fracture of the proximal left femur.  Orthopedic surgery was consulted.  Patient underwent IM nailing by Dr. Charlann Boxer 08/15/23. PT recommended SNF placement.  Discharge Diagnoses:   Principal Problem:   Femur fracture, left (HCC) Active Problems:   Closed left femoral fracture (HCC)   Fall at home, initial encounter   Acute kidney injury superimposed on CKD (HCC)   Essential hypertension   TIA (transient ischemic attack)   GERD (gastroesophageal reflux disease)   Hyperlipidemia   Dementia without behavioral disturbance (HCC)   Reactive airway disease   Left proximal fever intertrochanteric fracture secondary to fall -Status post IM nailing by Dr. Charlann Boxer 08/15/2023 -PT evaluation recommending SNF placement   CKD stage IIIa -Baseline creatinine around 1 -Stable   Asymptomatic pyuria -Stable    Hypertension -Maxide    Hyperlipidemia -Lipitor   Mild dementia -Followed by neurology outpatient  Discharge Instructions  Discharge Instructions     Increase activity slowly   Complete by: As directed       Allergies as of 08/18/2023       Reactions   Tomato Swelling, Rash   Protonix [pantoprazole] Diarrhea        Medication List     TAKE  these medications    albuterol 108 (90 Base) MCG/ACT inhaler Commonly known as: VENTOLIN HFA Inhale 2 puffs into the lungs every 6 (six) hours as needed for wheezing or shortness of breath.   aspirin 81 MG chewable tablet Chew 1 tablet (81 mg total) by mouth 2 (two) times daily for 28 days.   atorvastatin 20 MG tablet Commonly known as: LIPITOR Take 20 mg by mouth in the morning.   fluticasone 50 MCG/ACT nasal spray Commonly known as: FLONASE Place 1 spray into both nostrils daily.   fluticasone-salmeterol 230-21 MCG/ACT inhaler Commonly known as: ADVAIR HFA Inhale 2 puffs into the lungs 2 (two) times daily.   methocarbamol 500 MG tablet Commonly known as: ROBAXIN Take 1 tablet (500 mg total) by mouth every 6 (six) hours as needed for muscle spasms.   nitroGLYCERIN 0.4 MG SL tablet Commonly known as: NITROSTAT Place 0.4 mg under the tongue every 5 (five) minutes as needed for chest pain. What changed: Another medication with the same name was removed. Continue taking this medication, and follow the directions you see here.   OVER THE COUNTER MEDICATION Take 1 tablet by mouth daily. Aller-tec 10 mg   polyethylene glycol 17 g packet Commonly known as: MIRALAX / GLYCOLAX Take 17 g by mouth 2 (two) times daily.   senna 8.6 MG Tabs tablet Commonly known as: SENOKOT Take 2 tablets (17.2 mg total) by mouth at bedtime for 14 days.   sertraline 100 MG tablet Commonly known as: ZOLOFT Take 0.5 tablets (50 mg total) by mouth at bedtime. Takes 50 mg a day  traMADol 50 MG tablet Commonly known as: Ultram Take 1 tablet (50 mg total) by mouth every 6 (six) hours as needed for severe pain (pain score 7-10).   triamterene-hydrochlorothiazide 37.5-25 MG tablet Commonly known as: MAXZIDE-25 Take 0.5 tablets by mouth in the morning.        Follow-up Information     Durene Romans, MD. Schedule an appointment as soon as possible for a visit in 2 week(s).   Specialty: Orthopedic  Surgery Contact information: [address] [place] [address] [place] [address] [phone]                Allergies  Allergen Reactions   Tomato Swelling and Rash   Protonix [Pantoprazole] Diarrhea    Consultations: Orthopedic surgery    Procedures/Studies: DG HIP UNILAT WITH PELVIS 2-3 VIEWS LEFT Result Date: 08/15/2023 CLINICAL DATA:  ORIF left hip fracture EXAM: DG HIP (WITH OR WITHOUT PELVIS) 2-3V LEFT COMPARISON:  08/13/2023 FINDINGS: Six fluoroscopic images are obtained during performance of the procedure and are provided for interpretation only. Intramedullary rod with proximal dynamic and distal interlocking screw traverse a an intertrochanteric left hip fracture, with near anatomic alignment. Please refer to operative report. Fluoroscopy time: 44 seconds, 6.6328 mGy IMPRESSION: 1. ORIF intertrochanteric left hip fracture. Near anatomic alignment. Electronically Signed   By: Sharlet Salina M.D.   On: 08/15/2023 20:03   DG C-Arm 1-60 Min-No Report Result Date: 08/15/2023 Fluoroscopy was utilized by the requesting physician.  No radiographic interpretation.   CT HIP LEFT WO CONTRAST Result Date: 08/14/2023 CLINICAL DATA:  Hip trauma, fracture suspected EXAM: CT OF THE LEFT HIP WITHOUT CONTRAST TECHNIQUE: Multidetector CT imaging of the left hip was performed according to the standard protocol. Multiplanar CT image reconstructions were also generated. RADIATION DOSE REDUCTION: This exam was performed according to the departmental dose-optimization program which includes automated exposure control, adjustment of the mA and/or kV according to patient size and/or use of iterative reconstruction technique. COMPARISON:  Same day hip radiographs FINDINGS: Bones/Joint/Cartilage Demineralization. No femoral head dislocation. Mildly displaced comminuted inter trochanteric fracture of the left femur. There is mild superior displacement of the dominant distal fragment. Mild displacement  of the lesser trochanter. Ligaments Suboptimally assessed by CT. Muscles and Tendons Small amount of edema/hematoma within the abductor, gluteal and iliopsoas musculature. Soft tissues Soft tissue edema in the proximal left anteriorly and laterally. IMPRESSION: Mildly displaced comminuted intertrochanteric fracture of the left femur. Electronically Signed   By: Minerva Fester M.D.   On: 08/14/2023 21:30   DG Hip Unilat With Pelvis 2-3 Views Left Result Date: 08/13/2023 CLINICAL DATA:  Status post fall. EXAM: DG HIP (WITH OR WITHOUT PELVIS) 2-3V LEFT COMPARISON:  None Available. FINDINGS: There is an acute fracture deformity extending through the inter trochanteric region of the proximal left femur. Dorsal angulation of the fracture apex is seen. There is no evidence of dislocation. There is no evidence of significant arthropathy or other focal bone abnormality. IMPRESSION: Acute intertrochanteric fracture of the proximal left femur. Electronically Signed   By: Aram Candela M.D.   On: 08/13/2023 21:49   DG Chest 1 View Result Date: 08/13/2023 CLINICAL DATA:  Status post fall. EXAM: CHEST  1 VIEW COMPARISON:  July 08, 2020 FINDINGS: The heart size and mediastinal contours are within normal limits. Both lungs are clear. A chronic appearing deformity is seen along the greater tubercle of the left humeral head. Multilevel degenerative changes are present throughout the thoracic spine. IMPRESSION: No active cardiopulmonary disease. Electronically  Signed   By: Aram Candela M.D.   On: 08/13/2023 21:48      Discharge Exam: Vitals:   08/17/23 2219 08/18/23 0614  BP: (!) 113/93 126/86  Pulse: (!) 106 100  Resp: 17 17  Temp: 98.1 F (36.7 C) 98.2 F (36.8 C)  SpO2: 99% 100%    General: Pt is alert, awake, not in acute distress, sitting on commode, no respiratory distress, on room air  Psych: Normal mood and affect, stable judgement and insight     The results of significant diagnostics  from this hospitalization (including imaging, microbiology, ancillary and laboratory) are listed below for reference.     Microbiology: Recent Results (from the past 240 hours)  Surgical pcr screen     Status: None   Collection Time: 08/14/23 10:29 PM   Specimen: Nasal Mucosa; Nasal Swab  Result Value Ref Range Status   MRSA, PCR NEGATIVE NEGATIVE Final   Staphylococcus aureus NEGATIVE NEGATIVE Final    Comment: (NOTE) The Xpert SA Assay (FDA approved for NASAL specimens in patients 76 years of age and older), is one component of a comprehensive surveillance program. It is not intended to diagnose infection nor to guide or monitor treatment. Performed at [facility], [address], [place], [place] [address]      Labs: BNP (last 3 results) No results for input(s): "BNP" in the last 8760 hours. Basic Metabolic Panel: Recent Labs  Lab 08/13/23 2106 08/14/23 0307 08/15/23 0307 08/16/23 0829 08/17/23 0706  NA 136 138 138 138 137  K 3.7 4.0 3.7 3.9 3.7  CL 104 103 105 102 102  CO2 24 28 25 26 26   GLUCOSE 117* 154* 155* 179* 121*  BUN 30* 29* 20 24* 25*  CREATININE 1.03* 1.09* 0.91 1.02* 0.94  CALCIUM 8.6* 8.8* 8.7* 8.7* 8.3*  MG  --   --  2.1 2.2 2.3   Liver Function Tests: Recent Labs  Lab 08/14/23 0307 08/15/23 0307 08/17/23 0706  AST 26 29 23   ALT 24 24 13   ALKPHOS 87 80 53  BILITOT 0.4 0.4 0.8  PROT 6.8 6.5 6.0*  ALBUMIN 3.3* 3.2* 2.6*   No results for input(s): "LIPASE", "AMYLASE" in the last 168 hours. No results for input(s): "AMMONIA" in the last 168 hours. CBC: Recent Labs  Lab 08/13/23 2106 08/14/23 0307 08/15/23 0307 08/16/23 0904 08/17/23 0706  WBC 8.0 11.7* 11.6* 13.6* 12.8*  NEUTROABS 6.0  --   --   --   --   HGB 11.9* 11.1* 10.7* 10.0* 9.2*  HCT 37.2 36.3 34.2* 32.4* 29.2*  MCV 90.7 93.6 92.4 93.4 91.8  PLT 230 222 173 187 195   Cardiac Enzymes: No results for input(s): "CKTOTAL", "CKMB", "CKMBINDEX", "TROPONINI"  in the last 168 hours. BNP: Invalid input(s): "POCBNP" CBG: Recent Labs  Lab 08/13/23 2231 08/15/23 1426 08/15/23 1646 08/15/23 1735  GLUCAP 111* 112* 125* 131*   D-Dimer No results for input(s): "DDIMER" in the last 72 hours. Hgb A1c No results for input(s): "HGBA1C" in the last 72 hours. Lipid Profile No results for input(s): "CHOL", "HDL", "LDLCALC", "TRIG", "CHOLHDL", "LDLDIRECT" in the last 72 hours. Thyroid function studies No results for input(s): "TSH", "T4TOTAL", "T3FREE", "THYROIDAB" in the last 72 hours.  Invalid input(s): "FREET3" Anemia work up No results for input(s): "VITAMINB12", "FOLATE", "FERRITIN", "TIBC", "IRON", "RETICCTPCT" in the last 72 hours. Urinalysis    Component Value Date/Time   COLORURINE YELLOW 08/14/2023 2229   APPEARANCEUR CLEAR 08/14/2023 2229  APPEARANCEUR Clear 12/01/2022 1008   LABSPEC 1.016 08/14/2023 2229   PHURINE 5.0 08/14/2023 2229   GLUCOSEU NEGATIVE 08/14/2023 2229   HGBUR SMALL (A) 08/14/2023 2229   BILIRUBINUR NEGATIVE 08/14/2023 2229   BILIRUBINUR Negative 12/01/2022 1008   KETONESUR NEGATIVE 08/14/2023 2229   PROTEINUR NEGATIVE 08/14/2023 2229   NITRITE POSITIVE (A) 08/14/2023 2229   LEUKOCYTESUR LARGE (A) 08/14/2023 2229   Sepsis Labs Recent Labs  Lab 08/14/23 0307 08/15/23 0307 08/16/23 0904 08/17/23 0706  WBC 11.7* 11.6* 13.6* 12.8*   Microbiology Recent Results (from the past 240 hours)  Surgical pcr screen     Status: None   Collection Time: 08/14/23 10:29 PM   Specimen: Nasal Mucosa; Nasal Swab  Result Value Ref Range Status   MRSA, PCR NEGATIVE NEGATIVE Final   Staphylococcus aureus NEGATIVE NEGATIVE Final    Comment: (NOTE) The Xpert SA Assay (FDA approved for NASAL specimens in patients 29 years of age and older), is one component of a comprehensive surveillance program. It is not intended to diagnose infection nor to guide or monitor treatment. Performed at [facility], [address] [place], [place] [address]      Patient was seen and examined on the day of discharge and was found to be in stable condition. Time coordinating discharge: 35 minutes including assessment and coordination of care, as well as examination of the patient.   SIGNED:  Noralee Stain, DO [facility] 08/18/2023, 9:59 AM

## 2023-08-18 NOTE — Progress Notes (Signed)
   Subjective: 3 Days Post-Op Procedure(s) (LRB): INSERTION, INTRAMEDULLARY ROD, FEMUR (Left)  Patient seen in rounds for Dr. Charlann Boxer. Patient is resting in bed on exam this morning. Her daughter is at the bedside. Patient slept through most of the conversation. We discussed overall expectations and plans going forward. Daughter requested something in case she has severe pain at the facility. We will continue therapy today.   Objective: Vital signs in last 24 hours: Temp:  [97.8 F (36.6 C)-98.2 F (36.8 C)] 98.2 F (36.8 C) (03/20 0614) Pulse Rate:  [100-112] 100 (03/20 0614) Resp:  [17-18] 17 (03/20 0614) BP: (113-126)/(77-93) 126/86 (03/20 0614) SpO2:  [94 %-100 %] 100 % (03/20 0614)  Intake/Output from previous day:  Intake/Output Summary (Last 24 hours) at 08/18/2023 0743 Last data filed at 08/18/2023 0616 Gross per 24 hour  Intake 420 ml  Output 800 ml  Net -380 ml     Intake/Output this shift: No intake/output data recorded.  Labs: Recent Labs    08/16/23 0904 08/17/23 0706  HGB 10.0* 9.2*   Recent Labs    08/16/23 0904 08/17/23 0706  WBC 13.6* 12.8*  RBC 3.47* 3.18*  HCT 32.4* 29.2*  PLT 187 195   Recent Labs    08/16/23 0829 08/17/23 0706  NA 138 137  K 3.9 3.7  CL 102 102  CO2 26 26  BUN 24* 25*  CREATININE 1.02* 0.94  GLUCOSE 179* 121*  CALCIUM 8.7* 8.3*   No results for input(s): "LABPT", "INR" in the last 72 hours.  Exam: General - Patient is Alert Extremity - Neurologically intact Intact pulses distally Dorsiflexion/Plantar flexion intact Dressing - dressing C/D/I Motor Function - intact, moving foot and toes well on exam.   Past Medical History:  Diagnosis Date   Chest pain at rest, negative MI, negative stress test, may have been GI 12/06/2013   Diabetes mellitus without complication (HCC)    DOE (dyspnea on exertion), may be due to HTN 12/06/2013   GERD (gastroesophageal reflux disease)    H/O diabetes mellitus    HTN  (hypertension) 12/06/2013   Hypercholesteremia    Hypertension    TIA (transient ischemic attack)     Assessment/Plan: 3 Days Post-Op Procedure(s) (LRB): INSERTION, INTRAMEDULLARY ROD, FEMUR (Left) Principal Problem:   Femur fracture, left (HCC) Active Problems:   Essential hypertension   Closed left femoral fracture (HCC)   Fall at home, initial encounter   Acute kidney injury superimposed on CKD (HCC)   TIA (transient ischemic attack)   GERD (gastroesophageal reflux disease)   Hyperlipidemia   Dementia without behavioral disturbance (HCC)   Reactive airway disease  Estimated body mass index is 22.1 kg/m as calculated from the following:   Height as of this encounter: 5\' 7"  (1.702 m).   Weight as of this encounter: 64 kg. Advance diet Up with therapy  DVT Prophylaxis - Aspirin PWB 50%  I did print an rx for tramadol to use if needed for severe pain, but we discussed that this could also cause confusion so to use sparingly  F/U with [place] in 2 weeks in the office  Rosalene Billings, PA-C Orthopedic Surgery [phone] 08/18/2023, 7:43 AM

## 2023-08-18 NOTE — TOC Transition Note (Signed)
 Transition of Care [facility]) - Discharge Note   Patient Details  Name: Jacqueline Foley MRN: [id number] Date of Birth: [birth year]  [facility]) CM/SW Contact:  Adrian Prows, RN Phone Number: 08/18/2023, 10:56 AM   Clinical Narrative:    D/C orders received; spoke w/ pt and dtr Thompson Grayer at bedside; they agree to pt d/c to [facility] w/ transport by [facility]; Whitney at facility notified; SNF transfer report and d/c summary sent via hub; Whitney gave RM # [phone], call report # [phone]; [facility] called at [phone]; spoke w/ Rick;  no TOC needs.   Final next level of care: Skilled Nursing Facility Barriers to Discharge: No Barriers Identified   Patient Goals and CMS Choice            Discharge Placement              Patient chooses bed at: [facility] Patient to be transferred to facility by: [facility] Name of family member notified: Thompson Grayer (dtr) Patient and family notified of of transfer: 08/18/23  Discharge Plan and Services Additional resources added to the After Visit Summary for                                       Social Drivers of Health (SDOH) Interventions SDOH Screenings   Food Insecurity: No Food Insecurity (08/14/2023)  Housing: Low Risk  (08/14/2023)  Transportation Needs: No Transportation Needs (08/14/2023)  Utilities: Not At Risk (08/14/2023)  Social Connections: Unknown (08/14/2023)  Tobacco Use: Low Risk  (08/15/2023)     Readmission Risk Interventions    08/14/2023    8:41 AM  Readmission Risk Prevention Plan  Transportation Screening Complete  PCP or Specialist Appt within 5-7 Days Complete  Home Care Screening Complete  Medication Review (RN CM) Complete

## 2023-08-31 DIAGNOSIS — S72009A Fracture of unspecified part of neck of unspecified femur, initial encounter for closed fracture: Secondary | ICD-10-CM | POA: Insufficient documentation

## 2023-09-16 DIAGNOSIS — R55 Syncope and collapse: Secondary | ICD-10-CM | POA: Diagnosis not present

## 2023-09-16 DIAGNOSIS — R42 Dizziness and giddiness: Secondary | ICD-10-CM | POA: Diagnosis not present

## 2023-09-21 ENCOUNTER — Telehealth: Payer: Self-pay

## 2023-09-21 NOTE — Telephone Encounter (Signed)
 Called patient advised of below they verbalized understanding No questions at this time.

## 2023-09-21 NOTE — Telephone Encounter (Signed)
-----   Message from Lorrin Rotter sent at 09/21/2023 12:36 PM EDT ----- Please let Jacqueline Foley know that her predominant heart rhythm was sinus rhythm. There were a few short episodes of fast beats that originated from the top chambers of the heart. There were occasional single early beats from the bottom chambers of the heart. There were no high risk abnormal beats that would cause a syncopal event. Overall good results, continue current medications and follow up as planned.

## 2023-10-06 ENCOUNTER — Inpatient Hospital Stay (HOSPITAL_COMMUNITY)
Admission: EM | Admit: 2023-10-06 | Discharge: 2023-10-22 | DRG: 689 | Disposition: A | Discharge status: Skilled Nursing Facility | Source: Skilled Nursing Facility | Attending: Internal Medicine | Admitting: Internal Medicine

## 2023-10-06 ENCOUNTER — Other Ambulatory Visit: Payer: Self-pay

## 2023-10-06 ENCOUNTER — Encounter (HOSPITAL_COMMUNITY): Payer: Self-pay | Admitting: Internal Medicine

## 2023-10-06 DIAGNOSIS — E1122 Type 2 diabetes mellitus with diabetic chronic kidney disease: Secondary | ICD-10-CM | POA: Diagnosis present

## 2023-10-06 DIAGNOSIS — Z8673 Personal history of transient ischemic attack (TIA), and cerebral infarction without residual deficits: Secondary | ICD-10-CM

## 2023-10-06 DIAGNOSIS — B9620 Unspecified Escherichia coli [E. coli] as the cause of diseases classified elsewhere: Secondary | ICD-10-CM | POA: Diagnosis present

## 2023-10-06 DIAGNOSIS — R4182 Altered mental status, unspecified: Secondary | ICD-10-CM | POA: Diagnosis not present

## 2023-10-06 DIAGNOSIS — E7800 Pure hypercholesterolemia, unspecified: Secondary | ICD-10-CM | POA: Diagnosis present

## 2023-10-06 DIAGNOSIS — Z888 Allergy status to other drugs, medicaments and biological substances status: Secondary | ICD-10-CM

## 2023-10-06 DIAGNOSIS — R4781 Slurred speech: Secondary | ICD-10-CM | POA: Diagnosis present

## 2023-10-06 DIAGNOSIS — J4520 Mild intermittent asthma, uncomplicated: Secondary | ICD-10-CM | POA: Diagnosis not present

## 2023-10-06 DIAGNOSIS — Z8249 Family history of ischemic heart disease and other diseases of the circulatory system: Secondary | ICD-10-CM

## 2023-10-06 DIAGNOSIS — E876 Hypokalemia: Secondary | ICD-10-CM | POA: Diagnosis not present

## 2023-10-06 DIAGNOSIS — R45851 Suicidal ideations: Secondary | ICD-10-CM | POA: Diagnosis present

## 2023-10-06 DIAGNOSIS — Z9842 Cataract extraction status, left eye: Secondary | ICD-10-CM

## 2023-10-06 DIAGNOSIS — F0393 Unspecified dementia, unspecified severity, with mood disturbance: Secondary | ICD-10-CM | POA: Diagnosis present

## 2023-10-06 DIAGNOSIS — F0392 Unspecified dementia, unspecified severity, with psychotic disturbance: Secondary | ICD-10-CM | POA: Diagnosis present

## 2023-10-06 DIAGNOSIS — E7849 Other hyperlipidemia: Secondary | ICD-10-CM

## 2023-10-06 DIAGNOSIS — T17920A Food in respiratory tract, part unspecified causing asphyxiation, initial encounter: Secondary | ICD-10-CM | POA: Diagnosis not present

## 2023-10-06 DIAGNOSIS — B9689 Other specified bacterial agents as the cause of diseases classified elsewhere: Secondary | ICD-10-CM | POA: Diagnosis present

## 2023-10-06 DIAGNOSIS — Z7951 Long term (current) use of inhaled steroids: Secondary | ICD-10-CM | POA: Diagnosis not present

## 2023-10-06 DIAGNOSIS — R634 Abnormal weight loss: Secondary | ICD-10-CM | POA: Diagnosis present

## 2023-10-06 DIAGNOSIS — Z79899 Other long term (current) drug therapy: Secondary | ICD-10-CM | POA: Diagnosis not present

## 2023-10-06 DIAGNOSIS — N1831 Chronic kidney disease, stage 3a: Secondary | ICD-10-CM | POA: Diagnosis present

## 2023-10-06 DIAGNOSIS — F331 Major depressive disorder, recurrent, moderate: Secondary | ICD-10-CM | POA: Diagnosis present

## 2023-10-06 DIAGNOSIS — N179 Acute kidney failure, unspecified: Secondary | ICD-10-CM | POA: Diagnosis present

## 2023-10-06 DIAGNOSIS — N183 Chronic kidney disease, stage 3 unspecified: Secondary | ICD-10-CM | POA: Insufficient documentation

## 2023-10-06 DIAGNOSIS — Z833 Family history of diabetes mellitus: Secondary | ICD-10-CM

## 2023-10-06 DIAGNOSIS — G9341 Metabolic encephalopathy: Secondary | ICD-10-CM | POA: Diagnosis present

## 2023-10-06 DIAGNOSIS — B954 Other streptococcus as the cause of diseases classified elsewhere: Secondary | ICD-10-CM | POA: Diagnosis present

## 2023-10-06 DIAGNOSIS — Z6820 Body mass index (BMI) 20.0-20.9, adult: Secondary | ICD-10-CM

## 2023-10-06 DIAGNOSIS — Z8659 Personal history of other mental and behavioral disorders: Secondary | ICD-10-CM | POA: Diagnosis not present

## 2023-10-06 DIAGNOSIS — R7881 Bacteremia: Secondary | ICD-10-CM | POA: Diagnosis present

## 2023-10-06 DIAGNOSIS — J45909 Unspecified asthma, uncomplicated: Secondary | ICD-10-CM | POA: Diagnosis present

## 2023-10-06 DIAGNOSIS — I10 Essential (primary) hypertension: Secondary | ICD-10-CM | POA: Diagnosis present

## 2023-10-06 DIAGNOSIS — S7292XA Unspecified fracture of left femur, initial encounter for closed fracture: Secondary | ICD-10-CM | POA: Diagnosis present

## 2023-10-06 DIAGNOSIS — F411 Generalized anxiety disorder: Secondary | ICD-10-CM | POA: Diagnosis present

## 2023-10-06 DIAGNOSIS — E785 Hyperlipidemia, unspecified: Secondary | ICD-10-CM | POA: Diagnosis present

## 2023-10-06 DIAGNOSIS — Z91048 Other nonmedicinal substance allergy status: Secondary | ICD-10-CM

## 2023-10-06 DIAGNOSIS — I129 Hypertensive chronic kidney disease with stage 1 through stage 4 chronic kidney disease, or unspecified chronic kidney disease: Secondary | ICD-10-CM | POA: Diagnosis present

## 2023-10-06 DIAGNOSIS — F0394 Unspecified dementia, unspecified severity, with anxiety: Secondary | ICD-10-CM | POA: Diagnosis present

## 2023-10-06 DIAGNOSIS — N3000 Acute cystitis without hematuria: Principal | ICD-10-CM | POA: Diagnosis present

## 2023-10-06 DIAGNOSIS — Z9841 Cataract extraction status, right eye: Secondary | ICD-10-CM

## 2023-10-06 LAB — CBC WITH DIFFERENTIAL/PLATELET
Abs Immature Granulocytes: 0.01 10*3/uL (ref 0.00–0.07)
Basophils Absolute: 0.1 10*3/uL (ref 0.0–0.1)
Basophils Relative: 1 %
Eosinophils Absolute: 0.2 10*3/uL (ref 0.0–0.5)
Eosinophils Relative: 3 %
HCT: 37.2 % (ref 36.0–46.0)
Hemoglobin: 11.3 g/dL — ABNORMAL LOW (ref 12.0–15.0)
Immature Granulocytes: 0 %
Lymphocytes Relative: 29 %
Lymphs Abs: 2 10*3/uL (ref 0.7–4.0)
MCH: 28 pg (ref 26.0–34.0)
MCHC: 30.4 g/dL (ref 30.0–36.0)
MCV: 92.1 fL (ref 80.0–100.0)
Monocytes Absolute: 0.5 10*3/uL (ref 0.1–1.0)
Monocytes Relative: 7 %
Neutro Abs: 4.2 10*3/uL (ref 1.7–7.7)
Neutrophils Relative %: 60 %
Platelets: 384 10*3/uL (ref 150–400)
RBC: 4.04 MIL/uL (ref 3.87–5.11)
RDW: 14.1 % (ref 11.5–15.5)
WBC: 6.9 10*3/uL (ref 4.0–10.5)
nRBC: 0 % (ref 0.0–0.2)

## 2023-10-06 LAB — RAPID URINE DRUG SCREEN, HOSP PERFORMED
Amphetamines: NOT DETECTED
Barbiturates: NOT DETECTED
Benzodiazepines: NOT DETECTED
Cocaine: NOT DETECTED
Opiates: NOT DETECTED
Tetrahydrocannabinol: NOT DETECTED

## 2023-10-06 LAB — COMPREHENSIVE METABOLIC PANEL WITH GFR
ALT: 19 U/L (ref 0–44)
AST: 23 U/L (ref 15–41)
Albumin: 4 g/dL (ref 3.5–5.0)
Alkaline Phosphatase: 101 U/L (ref 38–126)
Anion gap: 9 (ref 5–15)
BUN: 25 mg/dL — ABNORMAL HIGH (ref 8–23)
CO2: 26 mmol/L (ref 22–32)
Calcium: 9.6 mg/dL (ref 8.9–10.3)
Chloride: 104 mmol/L (ref 98–111)
Creatinine, Ser: 0.94 mg/dL (ref 0.44–1.00)
GFR, Estimated: 58 mL/min — ABNORMAL LOW (ref >60)
Glucose, Bld: 108 mg/dL — ABNORMAL HIGH (ref 70–99)
Potassium: 4.1 mmol/L (ref 3.5–5.1)
Sodium: 139 mmol/L (ref 135–145)
Total Bilirubin: 0.6 mg/dL (ref 0.0–1.2)
Total Protein: 7.8 g/dL (ref 6.5–8.1)

## 2023-10-06 LAB — URINALYSIS, ROUTINE W REFLEX MICROSCOPIC
Bilirubin Urine: NEGATIVE
Glucose, UA: NEGATIVE mg/dL
Ketones, ur: NEGATIVE mg/dL
Nitrite: POSITIVE — AB
Protein, ur: NEGATIVE mg/dL
Specific Gravity, Urine: 1.027 (ref 1.005–1.030)
pH: 5 (ref 5.0–8.0)

## 2023-10-06 LAB — I-STAT CG4 LACTIC ACID, ED: Lactic Acid, Venous: 1 mmol/L (ref 0.5–1.9)

## 2023-10-06 LAB — ETHANOL: Alcohol, Ethyl (B): <15 mg/dL (ref <15)

## 2023-10-06 MED ORDER — ALBUTEROL SULFATE HFA 108 (90 BASE) MCG/ACT IN AERS: 2.0000 | INHALATION_SPRAY | Freq: Four times a day (QID) | RESPIRATORY_TRACT | Status: DC | PRN

## 2023-10-06 MED ORDER — TRIAMTERENE-HCTZ 37.5-25 MG PO TABS
0.5000 | ORAL_TABLET | Freq: Every day | ORAL | Status: DC
  Filled 2023-10-06: qty 0.5

## 2023-10-06 MED ORDER — SODIUM CHLORIDE 0.9 % IV SOLN
2.0000 g | Freq: Once | INTRAVENOUS | Status: AC
  Administered 2023-10-06: 2 g via INTRAVENOUS
  Filled 2023-10-06: qty 20

## 2023-10-06 MED ORDER — ENOXAPARIN SODIUM 30 MG/0.3ML IJ SOSY
30.0000 mg | PREFILLED_SYRINGE | INTRAMUSCULAR | Status: DC
  Administered 2023-10-07 – 2023-10-08 (×2): 30 mg via SUBCUTANEOUS
  Filled 2023-10-06 (×2): qty 0.3

## 2023-10-06 MED ORDER — FLUTICASONE PROPIONATE 50 MCG/ACT NA SUSP
1.0000 | Freq: Every day | NASAL | Status: DC
  Administered 2023-10-07 – 2023-10-21 (×15): 1 via NASAL
  Filled 2023-10-06: qty 16

## 2023-10-06 MED ORDER — SODIUM CHLORIDE 0.9% FLUSH: 3.0000 mL | INTRAVENOUS | Status: DC | PRN

## 2023-10-06 MED ORDER — METHOCARBAMOL 500 MG PO TABS
500.0000 mg | ORAL_TABLET | Freq: Four times a day (QID) | ORAL | Status: DC | PRN
  Administered 2023-10-07 – 2023-10-21 (×17): 500 mg via ORAL
  Filled 2023-10-06 (×17): qty 1

## 2023-10-06 MED ORDER — ACETAMINOPHEN 325 MG PO TABS
650.0000 mg | ORAL_TABLET | Freq: Four times a day (QID) | ORAL | Status: DC | PRN
  Administered 2023-10-07 – 2023-10-21 (×23): 650 mg via ORAL
  Filled 2023-10-06 (×25): qty 2

## 2023-10-06 MED ORDER — ONDANSETRON HCL 4 MG PO TABS: 4.0000 mg | ORAL_TABLET | Freq: Four times a day (QID) | ORAL | Status: DC | PRN

## 2023-10-06 MED ORDER — SODIUM CHLORIDE 0.9 % IV SOLN
1.0000 g | INTRAVENOUS | Status: DC
  Administered 2023-10-07: 1 g via INTRAVENOUS
  Filled 2023-10-06: qty 10

## 2023-10-06 MED ORDER — SODIUM CHLORIDE 0.9 % IV SOLN: 250.0000 mL | INTRAVENOUS | Status: AC | PRN

## 2023-10-06 MED ORDER — POLYETHYLENE GLYCOL 3350 17 G PO PACK
17.0000 g | PACK | Freq: Two times a day (BID) | ORAL | Status: DC
  Administered 2023-10-06 – 2023-10-21 (×17): 17 g via ORAL
  Filled 2023-10-06 (×26): qty 1

## 2023-10-06 MED ORDER — ONDANSETRON HCL 4 MG/2ML IJ SOLN
4.0000 mg | Freq: Four times a day (QID) | INTRAMUSCULAR | Status: DC | PRN
  Administered 2023-10-11: 4 mg via INTRAVENOUS
  Filled 2023-10-06: qty 2

## 2023-10-06 MED ORDER — ACETAMINOPHEN 650 MG RE SUPP: 650.0000 mg | Freq: Four times a day (QID) | RECTAL | Status: DC | PRN

## 2023-10-06 MED ORDER — HALOPERIDOL LACTATE 5 MG/ML IJ SOLN
1.0000 mg | Freq: Four times a day (QID) | INTRAMUSCULAR | Status: DC | PRN
  Administered 2023-10-09 – 2023-10-19 (×2): 1 mg via INTRAVENOUS
  Filled 2023-10-06 (×2): qty 1

## 2023-10-06 MED ORDER — FLUTICASONE FUROATE-VILANTEROL 200-25 MCG/ACT IN AEPB
1.0000 | INHALATION_SPRAY | Freq: Every day | RESPIRATORY_TRACT | Status: DC
  Administered 2023-10-07 – 2023-10-21 (×15): 1 via RESPIRATORY_TRACT
  Filled 2023-10-06 (×2): qty 28

## 2023-10-06 MED ORDER — OLANZAPINE 5 MG PO TABS
10.0000 mg | ORAL_TABLET | Freq: Every day | ORAL | Status: DC
  Administered 2023-10-06: 10 mg via ORAL
  Filled 2023-10-06: qty 2

## 2023-10-06 MED ORDER — SODIUM CHLORIDE 0.9% FLUSH
3.0000 mL | Freq: Two times a day (BID) | INTRAVENOUS | Status: DC
  Administered 2023-10-06 – 2023-10-21 (×28): 3 mL via INTRAVENOUS

## 2023-10-06 MED ORDER — ATORVASTATIN CALCIUM 10 MG PO TABS
20.0000 mg | ORAL_TABLET | Freq: Every day | ORAL | Status: DC
  Administered 2023-10-07 – 2023-10-21 (×15): 20 mg via ORAL
  Filled 2023-10-06 (×15): qty 2

## 2023-10-06 MED ORDER — SODIUM CHLORIDE 0.9% FLUSH
3.0000 mL | Freq: Two times a day (BID) | INTRAVENOUS | Status: DC
  Administered 2023-10-07 – 2023-10-21 (×27): 3 mL via INTRAVENOUS

## 2023-10-06 MED ORDER — ALBUTEROL SULFATE (2.5 MG/3ML) 0.083% IN NEBU: 2.5000 mg | INHALATION_SOLUTION | Freq: Four times a day (QID) | RESPIRATORY_TRACT | Status: DC | PRN

## 2023-10-06 MED ORDER — ENOXAPARIN SODIUM 40 MG/0.4ML IJ SOSY: 40.0000 mg | PREFILLED_SYRINGE | INTRAMUSCULAR | Status: DC

## 2023-10-06 NOTE — ED Triage Notes (Signed)
 Patient BIB her daughter due to anxiety, depression, and paranoid behavior. Patient was discharged from rehab today after having therapy due to broken hip. Patient has new diagnosis of Dementia but over past few weeks at rehab patient has been having increased anxiety, delusions, depression and had suicidal ideation. In triage patient states she was just upset and said she had SI before but does not want to hurt herself now and denies SI at this time.  Patients daughter called her PCP and he wanted her evaluated at ED for possible UTI or issue with her brain (CT Scan). Patient has had some recent med changes and just started Lexapro for her anxiety and depression due to previous medication not working.

## 2023-10-06 NOTE — H&P (Addendum)
 History and Physical    Jacqueline Foley QVZ:[id number] DOB: 06/02/1932 DOA: 10/06/2023  PCP: [name], DO   Patient coming from: Home   Chief Complaint: Paranoid delusion and hallucination  ED TRIAGE note:Patient BIB her daughter due to anxiety, depression, and paranoid behavior. Patient was discharged from rehab today after having therapy due to broken hip. Patient has new diagnosis of Dementia but over past few weeks at rehab patient has been having increased anxiety, delusions, depression and had suicidal ideation. In triage patient states she was just upset and said she had SI before but does not want to hurt herself now and denies SI at this time. Patients daughter called her PCP and he wanted her evaluated at ED for possible UTI or issue with her brain (CT Scan). Patient has had some recent med changes and just started Lexapro for her anxiety and depression due to previous medication not working.  Daughter at the bedside reported that patient is having visual hallucination and paranoid.  HPI:  Jacqueline Foley is a 89 y.o. female with medical history significant of generalized anxiety disorder with depression, memory impairment/dementia, left femoral intertrochanteric fracture s/p IM nailing repair 08/15/2023, CKD stage IIIa, hypertension, and hyperlipidemia presented to emergency department for mental status change include increased paranoia and delusion.Recently discharged from rehab after left intertrochanteric fracture following a fall. He was having some mental status changes at that time with increased paranoia, delusions and even suicidal ideation. Her primary care doctor has increased her sertraline  dose about 3 weeks ago. She has been living with her daughter since being discharged from rehab yesterday however daughter voices concern for worsening delusions and paranoid behavior.  Patient's daughter concerned that patient might have some UTI which is causing this worsening delusion  hallucination.    During my evaluation at the bedside patient is alert and oriented to self and patient's daughter at the bedside.  She is calm and quiet and redirectable.  However unable to obtain further history from the patient as she gets distracted very quickly and busy with dinner.  ED Course:  At presentation to ED patient is hemodynamically stable. CMP unremarkable stable renal function. Normal blood alcohol level. CBC unremarkable stable H&H. UA showing evidence of UTI.  Pending urine culture and culture. UDS negative.  In the ED patient has been treated with IV ceftriaxone  2 g.  Hospitalist has been consulted for further evaluation management of acute metabolic encephalopathy in the setting of UTI.  Significant labs in the ED: Lab Orders         Culture, blood (routine x 2)         Urine Culture         Comprehensive metabolic panel         Ethanol         Urine rapid drug screen (hosp performed)         CBC with Diff         Urinalysis, Routine w reflex microscopic -Urine, Clean Catch         Basic metabolic panel         CBC       Review of Systems:  Review of Systems  Unable to perform ROS: Psychiatric disorder    Past Medical History:  Diagnosis Date   Chest pain at rest, negative MI, negative stress test, may have been GI 12/06/2013   Diabetes mellitus without complication (HCC)    DOE (dyspnea on exertion), may be due to HTN 12/06/2013  GERD (gastroesophageal reflux disease)    H/O diabetes mellitus    HTN (hypertension) 12/06/2013   Hypercholesteremia    Hypertension    TIA (transient ischemic attack)     Past Surgical History:  Procedure Laterality Date   CATARACT EXTRACTION, BILATERAL  2018   EYE SURGERY     FEMUR IM NAIL Left 08/15/2023   Procedure: INSERTION, INTRAMEDULLARY ROD, FEMUR;  Surgeon: Claiborne Crew, MD;  Location: [facility];  Service: Orthopedics;  Laterality: Left;   FOOT SURGERY       reports that she has never smoked. She has never  used smokeless tobacco. She reports that she does not currently use alcohol. She reports that she does not use drugs.  Allergies  Allergen Reactions   Tomato Swelling and Rash   Protonix  [Pantoprazole ] Diarrhea    Family History  Problem Relation Age of Onset   Diabetes Mother    Heart disease Mother    Hypertension Mother    Prostate cancer Father     Prior to Admission medications   Medication Sig Start Date End Date Taking? Authorizing Provider  albuterol  (PROVENTIL  HFA;VENTOLIN  HFA) 108 (90 Base) MCG/ACT inhaler Inhale 2 puffs into the lungs every 6 (six) hours as needed for wheezing or shortness of breath.    [provider]  atorvastatin  (LIPITOR) 20 MG tablet Take 20 mg by mouth in the morning.    [provider]  fluticasone  (FLONASE ) 50 MCG/ACT nasal spray Place 1 spray into both nostrils daily.    [provider]  fluticasone -salmeterol (ADVAIR HFA) 230-21 MCG/ACT inhaler Inhale 2 puffs into the lungs 2 (two) times daily.    [provider]  methocarbamol  (ROBAXIN ) 500 MG tablet Take 1 tablet (500 mg total) by mouth every 6 (six) hours as needed for muscle spasms. 08/17/23   Earnie Gola, PA-C  nitroGLYCERIN  (NITROSTAT ) 0.4 MG SL tablet Place 0.4 mg under the tongue every 5 (five) minutes as needed for chest pain.    [provider]  OVER THE COUNTER MEDICATION Take 1 tablet by mouth daily. Aller-tec 10 mg    [provider]  polyethylene glycol (MIRALAX  / GLYCOLAX ) 17 g packet Take 17 g by mouth 2 (two) times daily. 08/17/23   Earnie Gola, PA-C  sertraline  (ZOLOFT ) 100 MG tablet Take 0.5 tablets (50 mg total) by mouth at bedtime. Takes 50 mg a day 08/18/23   Daren Eck, DO  traMADol  (ULTRAM ) 50 MG tablet Take 1 tablet (50 mg total) by mouth every 6 (six) hours as needed for severe pain (pain score 7-10). 08/18/23 08/17/24  Earnie Gola, PA-C  triamterene -hydrochlorothiazide  (MAXZIDE -25) 37.5-25 MG tablet Take  0.5 tablets by mouth in the morning. 03/03/20   [provider]     Physical Exam: Vitals:   10/06/23 1926 10/06/23 2316  BP: (!) 138/100 (!) 149/106  Pulse: (!) 109 (!) 107  Temp: 98.4 F (36.9 C) 98.1 F (36.7 C)  TempSrc: Oral Oral  SpO2: 100% 98%    Physical Exam Constitutional:      Appearance: She is not ill-appearing.  Cardiovascular:     Rate and Rhythm: Normal rate and regular rhythm.     Pulses: Normal pulses.     Heart sounds: Normal heart sounds.  Pulmonary:     Effort: Pulmonary effort is normal.     Breath sounds: Normal breath sounds.  Abdominal:     Palpations: Abdomen is soft.  Skin:    Capillary Refill: Capillary refill takes  less than 2 seconds.  Neurological:     Mental Status: She is alert.     Comments: Patient is alert and oriented to self  Psychiatric:        Attention and Perception: She is inattentive.        Mood and Affect: Mood normal.        Speech: Speech is not delayed.        Behavior: Behavior is cooperative.        Thought Content: Thought content is paranoid and delusional. Thought content does not include homicidal or suicidal ideation.        Cognition and Memory: Cognition is impaired. Memory is impaired.        Judgment: Judgment is inappropriate.      Labs on Admission: I have personally reviewed following labs and imaging studies  CBC: Recent Labs  Lab 10/06/23 2036  WBC 6.9  NEUTROABS 4.2  HGB 11.3*  HCT 37.2  MCV 92.1  PLT 384   Basic Metabolic Panel: Recent Labs  Lab 10/06/23 2036  NA 139  K 4.1  CL 104  CO2 26  GLUCOSE 108*  BUN 25*  CREATININE 0.94  CALCIUM  9.6   GFR: CrCl cannot be calculated (Unknown ideal weight.). Liver Function Tests: Recent Labs  Lab 10/06/23 2036  AST 23  ALT 19  ALKPHOS 101  BILITOT 0.6  PROT 7.8  ALBUMIN 4.0   No results for input(s): "LIPASE", "AMYLASE" in the last 168 hours. No results for input(s): "AMMONIA" in the last 168 hours. Coagulation  Profile: No results for input(s): "INR", "PROTIME" in the last 168 hours. Cardiac Enzymes: No results for input(s): "CKTOTAL", "CKMB", "CKMBINDEX", "TROPONINI", "TROPONINIHS" in the last 168 hours. BNP (last 3 results) No results for input(s): "BNP" in the last 8760 hours. HbA1C: No results for input(s): "HGBA1C" in the last 72 hours. CBG: No results for input(s): "GLUCAP" in the last 168 hours. Lipid Profile: No results for input(s): "CHOL", "HDL", "LDLCALC", "TRIG", "CHOLHDL", "LDLDIRECT" in the last 72 hours. Thyroid  Function Tests: No results for input(s): "TSH", "T4TOTAL", "FREET4", "T3FREE", "THYROIDAB" in the last 72 hours. Anemia Panel: No results for input(s): "VITAMINB12", "FOLATE", "FERRITIN", "TIBC", "IRON ", "RETICCTPCT" in the last 72 hours. Urine analysis:    Component Value Date/Time   COLORURINE YELLOW 10/06/2023 2118   APPEARANCEUR CLEAR 10/06/2023 2118   APPEARANCEUR Clear 12/01/2022 1008   LABSPEC 1.027 10/06/2023 2118   PHURINE 5.0 10/06/2023 2118   GLUCOSEU NEGATIVE 10/06/2023 2118   HGBUR MODERATE (A) 10/06/2023 2118   BILIRUBINUR NEGATIVE 10/06/2023 2118   BILIRUBINUR Negative 12/01/2022 1008   KETONESUR NEGATIVE 10/06/2023 2118   PROTEINUR NEGATIVE 10/06/2023 2118   NITRITE POSITIVE (A) 10/06/2023 2118   LEUKOCYTESUR SMALL (A) 10/06/2023 2118    Radiological Exams on Admission: I have personally reviewed images No results found.   EKG: My personal interpretation of EKG shows: EKG showing normal sinus rhythm heart rate 96.    Assessment/Plan: Principal Problem:   Acute metabolic encephalopathy Active Problems:   History of closed left femoral fracture (HCC)   Acute cystitis   Essential hypertension   Hyperlipidemia   Reactive airway disease   Generalized anxiety disorder   History of dementia   CKD (chronic kidney disease), stage III (HCC)    Assessment and Plan: Acute metabolic encephalopathy-UTI vs dementia progression vs  delusion/hallucination/GAD disorder Delusion and hallucination Paranoid behavior History of generalized anxiety disorder -Patient presented to emergency department with her daughter as patient is having paranoid  behavior, hallucination and delusion.  History of generalized anxiety disorder, depression and memory impairment being started on Zoloft  which has been changed to Lexapro which is causing worsening delusion and hallucination unable to control at home. -Acute metabolic encephalopathy likely in the setting of UTI versus underlying anxiety/depression/worsening dementia. -Holding Zoloft  and Lexapro. - Consulted inpatient psychiatry for evaluation for medication adjustment - Continue delirium precaution. -Continue Haldol  as needed and Zyprexa  at bedtime.   Acute cystitis -UA showing evidence of UTI.  Unable historian due to underlying dementia and worsening hallucination and delusion.  Pending urine culture. - Continue IV ceftriaxone  1 g daily  Essential hypertension -Continue Maxide once daily  Hyperlipidemia -Continue Lipitor  Reactive airway disease Stable. - Continue Breo Ellipta  twice daily and albuterol  as needed  CKD stage IIIa -Creatinine 0.9 and GFR 58.  Renal function at baseline.  Continue to monitor  Left proximal fever intertrochanteric fracture secondary to fall -Status post IM nailing by Dr. Bernard Brick 08/15/2023.  Patient completed 1 and 1/71-month of skilled nursing facility PT and OT therapy and discharged to home earlier today.  DVT prophylaxis:  Lovenox  and SCD Code Status:  Full Code Diet: Heart healthy diet Family Communication: Patient's daughter was present at bedside, at the time of interview. Opportunity was given to ask question and all questions were answered satisfactorily.  Disposition Plan: Continue to monitor improvement of delusion and hallucination.  Waiting for psychiatry inpatient evaluation Consults: Psychiatry Admission status:   Inpatient,  Med-Surg  Severity of Illness: The appropriate patient status for this patient is INPATIENT. Inpatient status is judged to be reasonable and necessary in order to provide the required intensity of service to ensure the patient's safety. The patient's presenting symptoms, physical exam findings, and initial radiographic and laboratory data in the context of their chronic comorbidities is felt to place them at high risk for further clinical deterioration. Furthermore, it is not anticipated that the patient will be medically stable for discharge from the hospital within 2 midnights of admission.   * I certify that at the point of admission it is my clinical judgment that the patient will require inpatient hospital care spanning beyond 2 midnights from the point of admission due to high intensity of service, high risk for further deterioration and high frequency of surveillance required.Aaron Aas    Shacola Schussler, MD [facility]  How to contact the [facility] Attending or Consulting provider 7A - 7P or covering provider during after hours 7P -7A, for this patient.  Check the care team in [facility] and look for a) attending/consulting [facility] provider listed and b) the [facility] team listed Log into [email or web address] and use [facility]'s universal password to access. If you do not have the password, please contact the hospital operator. Locate the [facility] provider you are looking for under [facility] and page to a number that you can be directly reached. If you still have difficulty reaching the provider, please page the [facility] (Director on Call) for the Hospitalists listed on amion for assistance.  10/07/2023, 3:56 AM

## 2023-10-06 NOTE — ED Notes (Signed)
 This nurse called floor to notify of upcoming patient.

## 2023-10-06 NOTE — ED Provider Notes (Signed)
 [place] EMERGENCY DEPARTMENT AT [facility] Provider Note   CSN: [id number] Arrival date & time: 10/06/23  1829     History  No chief complaint on file.   Jacqueline Foley is a 89 y.o. female.  With a history of dementia who presents to the ED for mental status changes.  Recently discharged from rehab after left intertrochanteric fracture following a fall.  He was having some mental status changes at that time with increased paranoia, delusions and even suicidal ideation.  Her primary care doctor has increased her sertraline  dose about 3 weeks ago.  She has been living with her daughter since being discharged from rehab yesterday however daughter voices concern for worsening delusions and paranoid behavior.  No HI or SI today.  She has seen similar mental status changes related to urinary tract infections and voiced concern for a UTI here today.  No reported fevers nausea vomiting or diarrhea  HPI     Home Medications Prior to Admission medications   Medication Sig Start Date End Date Taking? Authorizing Provider  albuterol  (PROVENTIL  HFA;VENTOLIN  HFA) 108 (90 Base) MCG/ACT inhaler Inhale 2 puffs into the lungs every 6 (six) hours as needed for wheezing or shortness of breath.    [provider]  atorvastatin  (LIPITOR) 20 MG tablet Take 20 mg by mouth in the morning.    [provider]  fluticasone  (FLONASE ) 50 MCG/ACT nasal spray Place 1 spray into both nostrils daily.    [provider]  fluticasone -salmeterol (ADVAIR HFA) 230-21 MCG/ACT inhaler Inhale 2 puffs into the lungs 2 (two) times daily.    [provider]  methocarbamol  (ROBAXIN ) 500 MG tablet Take 1 tablet (500 mg total) by mouth every 6 (six) hours as needed for muscle spasms. 08/17/23   Earnie Gola, PA-C  nitroGLYCERIN  (NITROSTAT ) 0.4 MG SL tablet Place 0.4 mg under the tongue every 5 (five) minutes as needed for chest pain.    [provider]  OVER THE COUNTER  MEDICATION Take 1 tablet by mouth daily. Aller-tec 10 mg    [provider]  polyethylene glycol (MIRALAX  / GLYCOLAX ) 17 g packet Take 17 g by mouth 2 (two) times daily. 08/17/23   Earnie Gola, PA-C  sertraline  (ZOLOFT ) 100 MG tablet Take 0.5 tablets (50 mg total) by mouth at bedtime. Takes 50 mg a day 08/18/23   Daren Eck, DO  traMADol  (ULTRAM ) 50 MG tablet Take 1 tablet (50 mg total) by mouth every 6 (six) hours as needed for severe pain (pain score 7-10). 08/18/23 08/17/24  Earnie Gola, PA-C  triamterene -hydrochlorothiazide  (MAXZIDE -25) 37.5-25 MG tablet Take 0.5 tablets by mouth in the morning. 03/03/20   [provider]      Allergies    Tomato and Protonix  [pantoprazole ]    Review of Systems   Review of Systems  Physical Exam Updated Vital Signs BP (!) 138/100 (BP Location: Left Arm)   Pulse (!) 109   Temp 98.4 F (36.9 C) (Oral)   SpO2 100%  Physical Exam Vitals and nursing note reviewed.  HENT:     Head: Normocephalic and atraumatic.  Eyes:     Pupils: Pupils are equal, round, and reactive to light.  Cardiovascular:     Rate and Rhythm: Normal rate and regular rhythm.  Pulmonary:     Effort: Pulmonary effort is normal.     Breath sounds: Normal breath sounds.  Abdominal:     Palpations: Abdomen is soft.     Tenderness:  There is no abdominal tenderness.  Skin:    General: Skin is warm and dry.  Neurological:     Mental Status: She is alert and oriented to person, place, and time.     Sensory: No sensory deficit.     Motor: No weakness.  Psychiatric:        Mood and Affect: Mood normal.     ED Results / Procedures / Treatments   Labs (all labs ordered are listed, but only abnormal results are displayed) Labs Reviewed  COMPREHENSIVE METABOLIC PANEL WITH GFR - Abnormal; Notable for the following components:      Result Value   Glucose, Bld 108 (*)    BUN 25 (*)    GFR, Estimated 58 (*)    All other components within normal limits   CBC WITH DIFFERENTIAL/PLATELET - Abnormal; Notable for the following components:   Hemoglobin 11.3 (*)    All other components within normal limits  URINALYSIS, ROUTINE W REFLEX MICROSCOPIC - Abnormal; Notable for the following components:   Hgb urine dipstick MODERATE (*)    Nitrite POSITIVE (*)    Leukocytes,Ua SMALL (*)    Bacteria, UA MANY (*)    All other components within normal limits  CULTURE, BLOOD (ROUTINE X 2)  CULTURE, BLOOD (ROUTINE X 2)  URINE CULTURE  ETHANOL  RAPID URINE DRUG SCREEN, HOSP PERFORMED  I-STAT CG4 LACTIC ACID, ED    EKG None  Radiology No results found.  Procedures Procedures    Medications Ordered in ED Medications  cefTRIAXone (ROCEPHIN) 2 g in sodium chloride  0.9 % 100 mL IVPB (has no administration in time range)    ED Course/ Medical Decision Making/ A&P Clinical Course as of 10/06/23 2237  Thu Oct 06, 2023  2236 UA is consistent with UTI with white cells nitrite.  This may represent the underlying cause of increased mental status changes.  Will cover with ceftriaxone and send for culture and admit to medicine [MP]    Clinical Course User Index [MP] Sallyanne Creamer, DO                                 Medical Decision Making 89 year old female with history as above presenting for increased delirium, paranoid delusions at home.  Has had similar presentations of UTI.  Recently discharged from rehab yesterday back to home with her daughter after left intertrochanteric fracture at the end of March.  Afebrile slightly tachycardic here.  Will obtain laboratory workup including UA chest x-ray to look for infectious cause of delirium.  She may require psych/geriatric psych consult for medication adjustment if there is no underlying infectious or metabolic cause for her mental status changes  Amount and/or Complexity of Data Reviewed Labs: ordered.           Final Clinical Impression(s) / ED Diagnoses Final diagnoses:  Acute  cystitis without hematuria  Altered mental status, unspecified altered mental status type    Rx / DC Orders ED Discharge Orders     None         Sallyanne Creamer, DO 10/06/23 2237

## 2023-10-07 ENCOUNTER — Inpatient Hospital Stay (HOSPITAL_COMMUNITY)

## 2023-10-07 DIAGNOSIS — F331 Major depressive disorder, recurrent, moderate: Secondary | ICD-10-CM | POA: Diagnosis not present

## 2023-10-07 DIAGNOSIS — G9341 Metabolic encephalopathy: Secondary | ICD-10-CM | POA: Diagnosis not present

## 2023-10-07 LAB — CBC
HCT: 34 % — ABNORMAL LOW (ref 36.0–46.0)
Hemoglobin: 10.1 g/dL — ABNORMAL LOW (ref 12.0–15.0)
MCH: 28 pg (ref 26.0–34.0)
MCHC: 29.7 g/dL — ABNORMAL LOW (ref 30.0–36.0)
MCV: 94.2 fL (ref 80.0–100.0)
Platelets: 311 10*3/uL (ref 150–400)
RBC: 3.61 MIL/uL — ABNORMAL LOW (ref 3.87–5.11)
RDW: 14.1 % (ref 11.5–15.5)
WBC: 5.8 10*3/uL (ref 4.0–10.5)
nRBC: 0 % (ref 0.0–0.2)

## 2023-10-07 LAB — BASIC METABOLIC PANEL WITH GFR
Anion gap: 8 (ref 5–15)
BUN: 22 mg/dL (ref 8–23)
CO2: 25 mmol/L (ref 22–32)
Calcium: 8.9 mg/dL (ref 8.9–10.3)
Chloride: 106 mmol/L (ref 98–111)
Creatinine, Ser: 0.7 mg/dL (ref 0.44–1.00)
GFR, Estimated: >60 mL/min (ref >60)
Glucose, Bld: 103 mg/dL — ABNORMAL HIGH (ref 70–99)
Potassium: 3.4 mmol/L — ABNORMAL LOW (ref 3.5–5.1)
Sodium: 139 mmol/L (ref 135–145)

## 2023-10-07 LAB — TSH: TSH: 0.99 u[IU]/mL (ref 0.350–4.500)

## 2023-10-07 LAB — AMMONIA: Ammonia: 18 umol/L (ref 9–35)

## 2023-10-07 LAB — VITAMIN B12: Vitamin B-12: 574 pg/mL (ref 180–914)

## 2023-10-07 MED ORDER — MIRTAZAPINE 15 MG PO TABS
7.5000 mg | ORAL_TABLET | Freq: Every day | ORAL | Status: DC
  Administered 2023-10-07: 7.5 mg via ORAL
  Filled 2023-10-07: qty 1

## 2023-10-07 MED ORDER — LORAZEPAM 2 MG/ML IJ SOLN: 1.0000 mg | Freq: Once | INTRAMUSCULAR | Status: DC

## 2023-10-07 MED ORDER — METOPROLOL TARTRATE 5 MG/5ML IV SOLN
5.0000 mg | INTRAVENOUS | Status: DC | PRN
  Administered 2023-10-08: 5 mg via INTRAVENOUS
  Filled 2023-10-07: qty 5

## 2023-10-07 MED ORDER — HYDRALAZINE HCL 20 MG/ML IJ SOLN
10.0000 mg | INTRAMUSCULAR | Status: DC | PRN
  Administered 2023-10-09: 10 mg via INTRAVENOUS
  Filled 2023-10-07: qty 1

## 2023-10-07 MED ORDER — OLANZAPINE 5 MG PO TABS
5.0000 mg | ORAL_TABLET | Freq: Every day | ORAL | Status: DC
  Administered 2023-10-07: 5 mg via ORAL
  Filled 2023-10-07: qty 1

## 2023-10-07 MED ORDER — POTASSIUM CHLORIDE CRYS ER 20 MEQ PO TBCR
40.0000 meq | EXTENDED_RELEASE_TABLET | Freq: Once | ORAL | Status: AC
  Administered 2023-10-07: 40 meq via ORAL
  Filled 2023-10-07: qty 2

## 2023-10-07 MED ORDER — GLUCAGON HCL RDNA (DIAGNOSTIC) 1 MG IJ SOLR
1.0000 mg | INTRAMUSCULAR | Status: DC | PRN
Start: 2023-10-07 — End: 2023-10-22

## 2023-10-07 MED ORDER — IPRATROPIUM-ALBUTEROL 0.5-2.5 (3) MG/3ML IN SOLN: 3.0000 mL | RESPIRATORY_TRACT | Status: DC | PRN

## 2023-10-07 MED ORDER — SODIUM CHLORIDE 0.9 % IV SOLN: INTRAVENOUS | Status: AC

## 2023-10-07 MED ORDER — SENNOSIDES-DOCUSATE SODIUM 8.6-50 MG PO TABS: 1.0000 | ORAL_TABLET | Freq: Every evening | ORAL | Status: DC | PRN

## 2023-10-07 NOTE — Hospital Course (Addendum)
 Brief Narrative:  89 year old with history of GAD, depression, dementia/memory impairment, left hip fracture status post IM nailing 08/15/2023, CKD 3A, HTN, HLD comes to the ED for evaluation of altered mental status/paranoia and delusion.  Upon admission basic metabolic workup was overall unremarkable besides urinary tract infection.  Started on IV Rocephin .   Assessment & Plan:  Principal Problem:   Acute metabolic encephalopathy Active Problems:   History of closed left femoral fracture (HCC)   Acute cystitis   Essential hypertension   Hyperlipidemia   Reactive airway disease   Generalized anxiety disorder   History of dementia   CKD (chronic kidney disease), stage III (HCC)   Acute metabolic encephalopathy-UTI vs dementia progression vs delusion/hallucination/GAD disorder Delusion and hallucination Paranoid behavior History of generalized anxiety disorder -Patient does not have any focal deficit.  I suspect this is delirium induced by possible underlying urinary tract infection.  Seen by psychiatry.  Increase Zyprexa , restarted Lexapro, started gabapentin  and discontinued Remeron. - B12 ammonia and TSH are normal -Gentle hydration - MRI brain, CTA head and neck are unremarkable   Acute cystitis, E. coli - Cultures for E. coli are pansensitive, continue current antibiotics.  Currently on Ancef   Gram-positive bacteremia, Streptococcus mitis - Will order echocardiogram to rule out any endocarditis.  Will repeat blood cultures.    Hypokalemia - As needed repletion   Essential hypertension, uncontrolled - Hold Maxide as patient is getting gentle hydration.  IV as needed -Start Coreg twice daily   Hyperlipidemia -Continue Lipitor   Reactive airway disease Stable.  As needed bronchodilators   CKD stage IIIa -Creatinine 0.9 and GFR 58.  Renal function at baseline.  Continue to monitor   Left proximal fever intertrochanteric fracture secondary to fall -Status post IM  nailing by Dr. Bernard Brick 08/15/2023.   PT/OT   DVT prophylaxis: Lovenox     Code Status: Full Code Family Communication: Family at bedside Status is: Inpatient Remains inpatient appropriate because: Continue hospital stay for at least 2 days    Subjective: Patient awake eating her breakfast.  Does not have any complaints at this time. Family present at bedside  Examination:  General exam: Appears calm and comfortable, elderly frail Respiratory system: Clear to auscultation. Respiratory effort normal. Cardiovascular system: S1 & S2 heard, RRR. No JVD, murmurs, rubs, gallops or clicks. No pedal edema. Gastrointestinal system: Abdomen is nondistended, soft and nontender. No organomegaly or masses felt. Normal bowel sounds heard. Central nervous system: Alert and oriented to name and place.  No obvious focal neurodeficits Extremities: Symmetric 5 x 5 power. Skin: No rashes, lesions or ulcers Psychiatry: Judgement and insight appear poor but follows all commands appropriately

## 2023-10-07 NOTE — Plan of Care (Signed)

## 2023-10-07 NOTE — Evaluation (Signed)
 Occupational Therapy Evaluation Patient Details Name: Carlysia Melnichuk MRN: [id number] DOB: 1933-02-26 Today's Date: 10/07/2023   History of Present Illness   89 y.o. female adm 5/8 with medical history significant of generalized anxiety disorder with depression, memory impairment/dementia, left femoral intertrochanteric fracture s/p IM nailing repair 08/15/2023, CKD stage IIIa, hypertension, and hyperlipidemia presented to emergency department for mental status change include increased paranoia and delusion.  Just discharged from [facility], with planned [facility] at daughter's home.  Cleared for WBAT.     Clinical Impressions Patient admitted for diagnosis above.  Patient with recent hospitalization s/p IM Nail and discharged from [facility] 5/8 with essentially direct admit to hospital.  Daughter endorses increased confusion over baseline.  Patient has her own apartment, which is handicapped assessable, but was going to discharge from SNF to the daughter's home.  Patient presents with the deficits noted below, needing up to Mod A for loser body ADL, and up to Min a for very basic transfers.  Patient only able to take a few steps this session.  Poor RW management and decreased safety.  OT will follow in the acute setting, and Patient will benefit from continued inpatient follow up therapy, <3 hours/day.  Daughter is unsure she can provide the needed 24 hour care.  Advised daughter to speak with CM regarding medicare days left and ability to return to SNF for ST Rehab.  The plan was for home to daughter with [facility] OT,PT and ST.  Daughter would need some PCA assist at home for the mother.       If plan is discharge home, recommend the following:   A lot of help with walking and/or transfers;A lot of help with bathing/dressing/bathroom;Assist for transportation;Help with stairs or ramp for entrance;Direct supervision/assist for medications management;Assistance with cooking/housework;Supervision due to cognitive  status     Functional Status Assessment   Patient has had a recent decline in their functional status and demonstrates the ability to make significant improvements in function in a reasonable and predictable amount of time.     Equipment Recommendations   Tub/shower bench     Recommendations for Other Services         Precautions/Restrictions   Precautions Precautions: Fall Restrictions Weight Bearing Restrictions Per Provider Order: Yes LLE Weight Bearing Per Provider Order: Weight bearing as tolerated     Mobility Bed Mobility Overal bed mobility: Needs Assistance Bed Mobility: Supine to Sit     Supine to sit: Supervision, HOB elevated       Patient Response: Cooperative  Transfers Overall transfer level: Needs assistance   Transfers: Sit to/from Stand, Bed to chair/wheelchair/BSC Sit to Stand: Min assist   Squat pivot transfers: Min assist       General transfer comment: Decreased RW safety      Balance Overall balance assessment: Needs assistance Sitting-balance support: Feet supported Sitting balance-Leahy Scale: Good     Standing balance support: Reliant on assistive device for balance Standing balance-Leahy Scale: Poor Standing balance comment: Narrow BOS, decreased safety with RW                           ADL either performed or assessed with clinical judgement   ADL Overall ADL's : Needs assistance/impaired Eating/Feeding: Set up;Sitting   Grooming: Wash/dry hands;Contact guard assist;Standing   Upper Body Bathing: Supervision/ safety;Sitting   Lower Body Bathing: Moderate assistance;Sit to/from stand   Upper Body Dressing : Supervision/safety;Sitting   Lower Body Dressing: Moderate  assistance;Sit to/from stand   Toilet Transfer: Minimal assistance;BSC/3in1;Stand-pivot                   Vision Patient Visual Report: No change from baseline       Perception Perception: Not tested       Praxis  Praxis: Not tested       Pertinent Vitals/Pain Pain Assessment Pain Assessment: Faces Faces Pain Scale: Hurts little more Pain Location: L hip and groin Pain Descriptors / Indicators: Aching, Sore, Tender, Grimacing Pain Intervention(s): Monitored during session     Extremity/Trunk Assessment Upper Extremity Assessment Upper Extremity Assessment: Overall WFL for tasks assessed   Lower Extremity Assessment Lower Extremity Assessment: Defer to PT evaluation   Cervical / Trunk Assessment Cervical / Trunk Assessment: Kyphotic   Communication Communication Communication: No apparent difficulties   Cognition Arousal: Alert Behavior During Therapy: Anxious Cognition: Cognition impaired       Memory impairment (select all impairments): Short-term memory Attention impairment (select first level of impairment): Sustained attention Executive functioning impairment (select all impairments): Reasoning, Problem solving                   Following commands: Impaired Following commands impaired: Follows one step commands with increased time     Cueing  General Comments   Cueing Techniques: Verbal cues;Gestural cues   VSS on RA   Exercises     Shoulder Instructions      Home Living Family/patient expects to be discharged to:: Private residence Living Arrangements: Children Available Help at Discharge: Family;Available 24 hours/day Type of Home: House Home Access: Stairs to enter [employer] of Steps: 7 Entrance Stairs-Rails: Left Home Layout: One level     Bathroom Shower/Tub: Chief Strategy Officer: Standard Bathroom Accessibility: No   Home Equipment: Agricultural consultant (2 wheels);Rollator (4 wheels);Cane - quad;Wheelchair - manual;BSC/3in1   Additional Comments: Pt does have AFO for L LE. Patient has her own apartment, but will be staying at daughter's until she regains independence.      Prior Functioning/Environment Prior Level of  Function : Needs assist       Physical Assist : ADLs (physical)   ADLs (physical): Bathing;Dressing;Toileting;IADLs   ADLs Comments: Assist as needed for lower body ADL.  Min A for toielting.  Assist with medications.    OT Problem List: Decreased strength;Decreased range of motion;Decreased activity tolerance;Impaired balance (sitting and/or standing);Decreased safety awareness;Pain   OT Treatment/Interventions: Self-care/ADL training;Therapeutic exercise;Therapeutic activities;Patient/family education;DME and/or AE instruction;Balance training      OT Goals(Current goals can be found in the care plan section)   Acute Rehab OT Goals Patient Stated Goal: Return home OT Goal Formulation: With patient Time For Goal Achievement: 10/21/23 Potential to Achieve Goals: Good ADL Goals Pt Will Perform Grooming: with modified independence;standing Pt Will Perform Lower Body Dressing: with modified independence;sit to/from stand Pt Will Transfer to Toilet: with modified independence;regular height toilet;ambulating   OT Frequency:  Min 2X/week    Co-evaluation              AM-PAC OT "6 Clicks" Daily Activity     Outcome Measure Help from another person eating meals?: None Help from another person taking care of personal grooming?: A Little Help from another person toileting, which includes using toliet, bedpan, or urinal?: A Lot Help from another person bathing (including washing, rinsing, drying)?: A Lot Help from another person to put on and taking off regular upper body clothing?: A Little Help from another  person to put on and taking off regular lower body clothing?: A Lot 6 Click Score: 16   End of Session Equipment Utilized During Treatment: Gait belt;Rolling walker (2 wheels) Nurse Communication: Mobility status  Activity Tolerance: Patient tolerated treatment well Patient left: in chair;with family/visitor present;with chair alarm set;with call bell/phone within  reach  OT Visit Diagnosis: Unsteadiness on feet (R26.81);Muscle weakness (generalized) (M62.81);History of falling (Z91.81);Pain Pain - Right/Left: Left Pain - part of body: Hip;Leg                Time: 1610-9604 OT Time Calculation (min): 24 min Charges:  OT General Charges $OT Visit: 1 Visit OT Evaluation $OT Eval Moderate Complexity: 1 Mod OT Treatments $Self Care/Home Management : 8-22 mins  10/07/2023  RP, OTR/L  Acute Rehabilitation Services  Office:  [phone]   Benjamen Brand 10/07/2023, 12:42 PM

## 2023-10-07 NOTE — TOC Initial Note (Addendum)
 Transition of Care [facility]) - Initial/Assessment Note    Patient Details  Name: Jacqueline Foley MRN: [id number] Date of Birth: 12/22/1932  Transition of [facility] CM/SW Contact:    Kathryn Parish, RN Phone Number: 10/07/2023, 3:06 PM  Clinical Narrative:                 NCM met with patient and daughter in the room. According to daughter PTA patient was discharges on 10/06/2023 form [facility] . SNF set up [facility] PT,OT, Speech, and nurse with [facility]. DME (wheelchair, rollator, walker, cane, bedside commode) and requested a shower chair. NCM informed [place] is not covered under insurance and wound be self pay. Daughter requested a list of self pay private duty care agencies. Daughter stated that patient will be living with her and the home is not handicapped ready. NCM informed daughter that making a home handicapped ready is not covered under insurance.  TOC will follow for discharge needs. 3:49 PM Private duty list given to daughter.  Expected Discharge Plan: Home w Home Health Services Barriers to Discharge: Continued Medical Work up   Patient Goals and CMS Choice Patient states their goals for this hospitalization and ongoing recovery are:: To my daughter house   Choice offered to / list presented to : NA      Expected Discharge Plan and Services In-house Referral: Chaplain Discharge Planning Services: CM Consult Post Acute Care Choice: NA Living arrangements for the past 2 months: Assisted Living Facility Administrator Living)                 DME Arranged: N/A DME Agency: NA       HH Arranged: NA HH Agency: NA        Prior Living Arrangements/Services Living arrangements for the past 2 months: Assisted Living Facility Administrator Living) Lives with:: Self Patient language and need for interpreter reviewed:: Yes Do you feel safe going back to the place where you live?: Yes      Need for Family Participation in Patient Care: Yes (Comment) Care giver support system in  place?: Yes (comment) Current home services: DME, Homehealth aide, Home OT, Home PT, Home RN [facility]) Criminal Activity/Legal Involvement Pertinent to Current Situation/Hospitalization: No - Comment as needed  Activities of Daily Living   ADL Screening (condition at time of admission) Independently performs ADLs?: No Does the patient have a NEW difficulty with bathing/dressing/toileting/self-feeding that is expected to last >3 days?: Yes (Initiates electronic notice to provider for possible OT consult) Does the patient have a NEW difficulty with getting in/out of bed, walking, or climbing stairs that is expected to last >3 days?: Yes (Initiates electronic notice to provider for possible PT consult) Does the patient have a NEW difficulty with communication that is expected to last >3 days?: No Is the patient deaf or have difficulty hearing?: No Does the patient have difficulty seeing, even when wearing glasses/contacts?: No Does the patient have difficulty concentrating, remembering, or making decisions?: Yes  Permission Sought/Granted Permission sought to share information with : Case Manager Permission granted to share information with : Yes, Verbal Permission Granted  Share Information with NAME: Jacqueline Foley and [facility]  Permission granted to share info w AGENCY: [facility]  Permission granted to share info w Relationship: daughter  Permission granted to share info w Contact Information: [phone]  Emotional Assessment Appearance:: Appears stated age Attitude/Demeanor/Rapport: Engaged Affect (typically observed): Appropriate Orientation: : Oriented to Self, Oriented to Place, Oriented to  Time, Oriented to Situation Alcohol /  Substance Use: Not Applicable Psych Involvement: No (comment)  Admission diagnosis:  Acute cystitis without hematuria [N30.00] Altered mental status, unspecified altered mental status type [R41.82] Acute metabolic encephalopathy [G93.41] Patient Active Problem List    Diagnosis Date Noted   Major depressive disorder, recurrent episode, moderate (HCC) 10/07/2023   Acute cystitis 10/06/2023   Generalized anxiety disorder 10/06/2023   History of dementia 10/06/2023   CKD (chronic kidney disease), stage III (HCC) 10/06/2023   Acute metabolic encephalopathy 10/06/2023   History of closed left femoral fracture (HCC) 08/13/2023   Fall at home, initial encounter 08/13/2023   Acute kidney injury superimposed on CKD (HCC) 08/13/2023   TIA (transient ischemic attack) 08/13/2023   GERD (gastroesophageal reflux disease) 08/13/2023   Hyperlipidemia 08/13/2023   Dementia without behavioral disturbance (HCC) 08/13/2023   Reactive airway disease 08/13/2023   Femur fracture, left (HCC) 08/13/2023   Aortic atherosclerosis (HCC) 03/18/2020   History of chest pain 03/19/2019   Chest pain 11/08/2017   Lumbar radiculopathy 05/02/2017   Precordial chest pain 12/06/2013   Chest pain at rest, negative MI, negative stress test, may have been GI 12/06/2013   Essential hypertension 12/06/2013   DOE (dyspnea on exertion), may be due to HTN 12/06/2013   PCP:  [name], DO Pharmacy:   [facility] [phone] - [place], [place] - [address] AT [facility] OF [address] [place] [address] Phone: [phone] Fax: [phone]     Social Drivers of Health (SDOH) Social History: SDOH Screenings   Food Insecurity: No Food Insecurity (10/06/2023)  Housing: Low Risk  (10/06/2023)  Transportation Needs: No Transportation Needs (10/06/2023)  Utilities: Not At Risk (10/06/2023)  Social Connections: Socially Isolated (10/06/2023)  Tobacco Use: Low Risk  (10/06/2023)   SDOH Interventions:     Readmission Risk Interventions    10/07/2023    2:59 PM 08/14/2023    8:41 AM  Readmission Risk Prevention Plan  Transportation Screening Complete Complete  PCP or Specialist Appt within 5-7 Days Complete Complete  Home Care Screening Complete  Complete  Medication Review (RN CM) Complete Complete

## 2023-10-07 NOTE — Progress Notes (Signed)
 PROGRESS NOTE    Jacqueline Foley  ZOX:[id number] DOB: [birth year] DOA: 10/06/2023 PCP: [name], DO    Brief Narrative:  89 year old with history of GAD, depression, dementia/memory impairment, left hip fracture status post IM nailing 08/15/2023, CKD 3A, HTN, HLD comes to the ED for evaluation of altered mental status/paranoia and delusion.  Upon admission basic metabolic workup was overall unremarkable besides urinary tract infection.  Started on IV Rocephin .   Assessment & Plan:  Principal Problem:   Acute metabolic encephalopathy Active Problems:   History of closed left femoral fracture (HCC)   Acute cystitis   Essential hypertension   Hyperlipidemia   Reactive airway disease   Generalized anxiety disorder   History of dementia   CKD (chronic kidney disease), stage III (HCC)   Acute metabolic encephalopathy-UTI vs dementia progression vs delusion/hallucination/GAD disorder Delusion and hallucination Paranoid behavior History of generalized anxiety disorder -Patient does not have any focal deficit.  I suspect this is delirium induced by possible underlying urinary tract infection.  Also possible in the setting of medication adjustments.  Inpatient psychiatry has been consulted to help adjust his medications for his delirium/paranoid behavior - B12 ammonia and TSH are normal -Gentle hydration -MRI brain to rule out any CVA   Acute cystitis - UA suspicious for UTI.  Cultures are pending.  Empiric IV Rocephin   Hypokalemia - As needed repletion   Essential hypertension - Hold Maxide as patient is getting gentle hydration.  IV as needed   Hyperlipidemia -Continue Lipitor   Reactive airway disease Stable.  As needed bronchodilators   CKD stage IIIa -Creatinine 0.9 and GFR 58.  Renal function at baseline.  Continue to monitor   Left proximal fever intertrochanteric fracture secondary to fall -Status post IM nailing by Dr. Bernard Brick 08/15/2023.   PT/OT   DVT  prophylaxis: Lovenox     Code Status: Full Code Family Communication: Family at bedside Status is: Inpatient Remains inpatient appropriate because: Continue hospital stay for at least 2 days    Subjective:  Seen at bedside.  Per family patient has mild slurring of words and tells me her tongue feels heavy.  She does not have any complaints.  She follows all the basic commands.  No focal neurodeficits otherwise  Examination:  General exam: Appears calm and comfortable, elderly frail Respiratory system: Clear to auscultation. Respiratory effort normal. Cardiovascular system: S1 & S2 heard, RRR. No JVD, murmurs, rubs, gallops or clicks. No pedal edema. Gastrointestinal system: Abdomen is nondistended, soft and nontender. No organomegaly or masses felt. Normal bowel sounds heard. Central nervous system: Alert and oriented to name and place.  No obvious focal neurodeficits Extremities: Symmetric 5 x 5 power. Skin: No rashes, lesions or ulcers Psychiatry: Judgement and insight appear poor but follows all commands appropriately                Diet Orders (From admission, onward)     Start     Ordered   10/06/23 2251  Diet Heart Room service appropriate? Yes; Fluid consistency: Thin  Diet effective now       Question Answer Comment  Room service appropriate? Yes   Fluid consistency: Thin      10/06/23 2250            Objective: Vitals:   10/06/23 2316 10/07/23 0516 10/07/23 0839 10/07/23 1238  BP: (!) 149/106 136/83  (!) 141/96  Pulse: (!) 107 93  97  Resp:  18  16  Temp: 98.1 F (36.7 C) 97.7  F (36.5 C)  97.6 F (36.4 C)  TempSrc: Oral Oral  Oral  SpO2: 98% 98% 95% 100%  Weight: 64 kg     Height: 5\' 7"  (1.702 m)       Intake/Output Summary (Last 24 hours) at 10/07/2023 1251 Last data filed at 10/07/2023 1122 Gross per 24 hour  Intake 3704 ml  Output --  Net 3704 ml   Filed Weights   10/06/23 2316  Weight: 64 kg    Scheduled Meds:  atorvastatin   20  mg Oral Daily   enoxaparin  (LOVENOX ) injection  30 mg Subcutaneous Q24H   fluticasone   1 spray Each Nare Daily   fluticasone  furoate-vilanterol  1 puff Inhalation Daily   LORazepam  1 mg Intravenous Once   mirtazapine  7.5 mg Oral QHS   OLANZapine   10 mg Oral QHS   polyethylene glycol  17 g Oral BID   sodium chloride  flush  3 mL Intravenous Q12H   sodium chloride  flush  3 mL Intravenous Q12H   Continuous Infusions:  sodium chloride      sodium chloride  75 mL/hr at 10/07/23 1122   cefTRIAXone  (ROCEPHIN )  IV      Nutritional status     Body mass index is 22.1 kg/m.  Data Reviewed:   CBC: Recent Labs  Lab 10/06/23 2036 10/07/23 0423  WBC 6.9 5.8  NEUTROABS 4.2  --   HGB 11.3* 10.1*  HCT 37.2 34.0*  MCV 92.1 94.2  PLT 384 311   Basic Metabolic Panel: Recent Labs  Lab 10/06/23 2036 10/07/23 0423  NA 139 139  K 4.1 3.4*  CL 104 106  CO2 26 25  GLUCOSE 108* 103*  BUN 25* 22  CREATININE 0.94 0.70  CALCIUM  9.6 8.9   GFR: Estimated Creatinine Clearance: 47.3 mL/min (by C-G formula based on SCr of 0.7 mg/dL). Liver Function Tests: Recent Labs  Lab 10/06/23 2036  AST 23  ALT 19  ALKPHOS 101  BILITOT 0.6  PROT 7.8  ALBUMIN 4.0   No results for input(s): "LIPASE", "AMYLASE" in the last 168 hours. Recent Labs  Lab 10/07/23 1016  AMMONIA 18   Coagulation Profile: No results for input(s): "INR", "PROTIME" in the last 168 hours. Cardiac Enzymes: No results for input(s): "CKTOTAL", "CKMB", "CKMBINDEX", "TROPONINI" in the last 168 hours. BNP (last 3 results) No results for input(s): "PROBNP" in the last 8760 hours. HbA1C: No results for input(s): "HGBA1C" in the last 72 hours. CBG: No results for input(s): "GLUCAP" in the last 168 hours. Lipid Profile: No results for input(s): "CHOL", "HDL", "LDLCALC", "TRIG", "CHOLHDL", "LDLDIRECT" in the last 72 hours. Thyroid  Function Tests: Recent Labs    10/07/23 1016  TSH 0.990   Anemia Panel: Recent Labs     10/07/23 1016  VITAMINB12 574   Sepsis Labs: Recent Labs  Lab 10/06/23 2258  LATICACIDVEN 1.0    Recent Results (from the past 240 hours)  Culture, blood (routine x 2)     Status: None (Preliminary result)   Collection Time: 10/06/23 10:49 PM   Specimen: BLOOD  Result Value Ref Range Status   Specimen Description   Final    BLOOD RIGHT ANTECUBITAL Performed at [facility], [address] [place], [place] [address]    Special Requests   Final    BOTTLES DRAWN AEROBIC AND ANAEROBIC Blood Culture results may not be optimal due to an inadequate volume of blood received in culture bottles Performed at [facility], [address] [place], [place] [address]    Culture   Final    NO GROWTH < 12 HOURS Performed at [facility], [address] [place], [place] [address]    Report Status PENDING  Incomplete  Culture, blood (routine x 2)     Status: None (Preliminary result)   Collection Time: 10/06/23 10:49 PM   Specimen: BLOOD LEFT HAND  Result Value Ref Range Status   Specimen Description   Final    BLOOD LEFT HAND Performed at [facility], [address], [place], [place] [address]    Special Requests   Final    BOTTLES DRAWN AEROBIC AND ANAEROBIC Blood Culture results may not be optimal due to an inadequate volume of blood received in culture bottles Performed at [facility], [address], [place], [place] [address]    Culture   Final    NO GROWTH < 12 HOURS Performed at [facility], [address] [place], [place] [address]    Report Status PENDING  Incomplete         Radiology Studies: No results found.         LOS: 1 day   Time spent= 35 mins    Maggie Schooner, MD [facility]  If 7PM-7AM, please contact night-coverage  10/07/2023, 12:51 PM

## 2023-10-07 NOTE — Progress Notes (Signed)
 Chaplain responded to [facility] consult for AD paperwork assistance. Chaplain provided AD education and paperwork to pt and her daughter, [name]. Pt will notify nurse when ready to request chaplain assistance with completing paperwork in the presence of a notary.  Chaplain [place], [place]    10/07/23 1300  Spiritual Encounters  Type of Visit Initial  Care provided to: Patient;Family  Referral source Patient request  Reason for visit Advance directives  OnCall Visit No  Spiritual Framework  Presenting Themes Goals in life/care;Meaning/purpose/sources of inspiration  Community/Connection Family  Patient Stress Factors Health changes  Interventions  Spiritual Care Interventions Made Established relationship of care and support;Compassionate presence;Reflective listening;Prayer;Narrative/life review  Intervention Outcomes  Outcomes Autonomy/agency;Reduced anxiety  Spiritual Care Plan  Spiritual Care Issues Still Outstanding Chaplain will continue to follow

## 2023-10-07 NOTE — Consult Note (Signed)
 [facility] Psychiatric Consult Initial  Patient Name: .Jacqueline Foley  MRN: [id number]  DOB: 15-Jan-1933  Consult Order details:  Orders (From admission, onward)     Start     Ordered   10/06/23 2248  IP CONSULT TO PSYCHIATRY       Ordering Provider: Sundil, Subrina, MD  Provider:  (Not yet assigned)  Question Answer Comment  Location [facility]   Reason for Consult? Delusion and hallucination.  History of generalized anxiety disorder and depression      10/06/23 2249             Mode of Visit: In person    Psychiatry Consult Evaluation  Service Date: Oct 07, 2023 LOS:  LOS: 1 day  Chief Complaint "They are worried about mind stuff" that is going on with her.  Primary Psychiatric Diagnoses  MDD, moderate   Assessment  Jacqueline Foley is a 89 y.o. female admitted: Medicallyfor 10/06/2023  7:25 PM for fall post hip surgery. She carries the psychiatric diagnoses of depression, dementia, and anxiety and has a past medical history of "memory impairment/dementia, left femoral intertrochanteric fracture s/p IM nailing repair 08/15/2023, CKD stage IIIa, hypertension, and hyperlipidemia (per MD, Dr. Ulice Gamer) presented to emergency department for mental status change include increased paranoia and delusion.Recently discharged from rehab after left intertrochanteric fracture following a fall. He was having some mental status changes at that time with increased paranoia, delusions and even suicidal ideation. Her primary care doctor has increased her sertraline  dose about 3 weeks ago. She has been living with her daughter since being discharged from rehab yesterday however daughter voices concern for worsening delusions and paranoid behavior. Patient's daughter concerned that patient might have some UTI which is causing this worsening delusion hallucination".    Her current presentation of low motivation and energy, perseverating on the loss of her mother 15 years ago, poor  appetite, dysphoric mood is most consistent with MDD.  Current outpatient psychotropic medications include Lexapro and historically she has had a non response to these medications. She was compliant with medications prior to admission as evidenced by her daughter's report. On initial examination, patient was calm and engaged easily in conversation, dysphoric mood with passive suicidal ideations at times. Please see plan below for detailed recommendations.   Diagnoses:  Active Hospital problems: Principal Problem:   Acute metabolic encephalopathy Active Problems:   Major depressive disorder, recurrent episode, moderate (HCC)   Essential hypertension   History of closed left femoral fracture (HCC)   Hyperlipidemia   Reactive airway disease   Acute cystitis   Generalized anxiety disorder   History of dementia   CKD (chronic kidney disease), stage III (HCC)    Plan   ## Psychiatric Medication Recommendations:  Decreased her Zyprexa  10 mg at bedtime to 5 mg Started Remeron 7.5 mg at bedtime for sleep, appetite, and depression  ## Medical Decision Making Capacity: Not specifically addressed in this encounter  ## Further Work-up:  -- most recent EKG on 10/06/23 had QtC of 448. -- Pertinent labwork reviewed earlier this admission includes: CBC and diff, chem panel, U/A, TSH   ## Disposition:-- There are no psychiatric contraindications to discharge at this time  ## Behavioral / Environmental: -Delirium Precautions: Delirium Interventions for Nursing and Staff: - RN to open blinds every AM. - To Bedside: Glasses, hearing aide, and pt's own shoes. Make available to patients. when possible and encourage use. - Encourage po fluids when appropriate, keep fluids within reach. - OOB  to chair with meals. - Passive ROM exercises to all extremities with AM & PM care. - RN to assess orientation to person, time and place QAM and PRN. - Recommend extended visitation hours with familiar family/friends as  feasible. - Staff to minimize disturbances at night. Turn off television when pt asleep or when not in use.    ## Safety and Observation Level:  - Based on my clinical evaluation, I estimate the patient to be at low risk of self harm in the current setting. - At this time, we recommend  routine. This decision is based on my review of the chart including patient's history and current presentation, interview of the patient, mental status examination, and consideration of suicide risk including evaluating suicidal ideation, plan, intent, suicidal or self-harm behaviors, risk factors, and protective factors. This judgment is based on our ability to directly address suicide risk, implement suicide prevention strategies, and develop a safety plan while the patient is in the clinical setting. Please contact our team if there is a concern that risk level has changed.  CSSR Risk Category:C-SSRS RISK CATEGORY: No Risk  Suicide Risk Assessment: Patient has following modifiable risk factors for suicide: under treated depression , which we are addressing by starting medications. Patient has following non-modifiable or demographic risk factors for suicide: none Patient has the following protective factors against suicide: Supportive family, Frustration tolerance, no history of suicide attempts, and no history of NSSIB  Thank you for this consult request. Recommendations have been communicated to the primary team.  We will continue to follow at this time.   Roslynn Coombes, NP       History of Present Illness  Relevant Aspects of [facility] Course:  Admitted on 10/06/2023 for UTI and paranoia with hallucinations.   Patient Report:  The client was alert and oriented on assessment.  She was able to provide a history and timeline without issues.  When asked what brought her to the hospital, she responded with "Falling at my house and I don't know why, I had my cane".  She recently fell and broke her hip  requiring surgery and follow up rehab that she completed.  Now, her left leg is causing pain including her knee.  She communicated that she is moving to a SNF and worried about how she will adjust to this as she has been living alone which she enjoys. "I do worry a lot."  She did endorse depression that increase with the recent events and told her daughter she was having suicidal ideations.  Today, these are not active suicidal ideations, passive ones that are intermittent.  Her sleep is not good, her pain does not help.  Appetite is also decreased with significant weight loss.  Caveat:  Her daughter was concerned about paranoia and hallucinations on admission and thought she may have a UTI.  She does have some leukocytes in her urine and being treated currently with antibiotics.  On assessment, she reported seeing things at times but not today.  She stated she saw her aunt on the wall prior to admission and knew it was not real.  Discussed medications, specifically Remeron, for appetite, sleep, and depression which she was agreeable.  She was very pleasant and cooperative. This provider will follow up with her daughter tomorrow to see if the UTI treatment cleared most of her symptoms along with the Zyprexa  the attending started (dose decreased by this provider from 10 mg to 5 mg).     Psych ROS:  Depression: moderate Anxiety:  mild to moderate Mania (lifetime and current): none Psychosis: (lifetime and current): none   Review of Systems  Constitutional: Negative.   HENT: Negative.    Eyes: Negative.   Respiratory: Negative.    Cardiovascular: Negative.   Gastrointestinal: Negative.   Genitourinary: Negative.   Musculoskeletal:  Positive for falls.       Hip pain  Skin: Negative.   Neurological: Negative.   Endo/Heme/Allergies: Negative.   Psychiatric/Behavioral:  Positive for depression and memory loss. The patient is nervous/anxious.      Psychiatric and Social History  Psychiatric  History:  Information collected from patient, chart  Prev Dx/Sx: dementia Current Psych Provider: none Home Meds (current): none Previous Med Trials: none Therapy: none  Prior Psych Hospitalization: none  Prior Self Harm: none Prior Violence: none  Family Psych History: none Family Hx suicide: none  Social History:  Occupational Hx: retired Equities trader: living alone, now moving to a SNF  Access to weapons/lethal means: none   Substance History Denied substance use  Exam Findings  Physical Exam: Completed by the MD, reviewed Vital Signs:  Temp:  [97.7 F (36.5 C)-98.4 F (36.9 C)] 97.7 F (36.5 C) (05/09 0516) Pulse Rate:  [93-109] 93 (05/09 0516) Resp:  [18] 18 (05/09 0516) BP: (136-149)/(83-106) 136/83 (05/09 0516) SpO2:  [95 %-100 %] 95 % (05/09 0839) Weight:  [64 kg] 64 kg (05/08 2316) Blood pressure 136/83, pulse 93, temperature 97.7 F (36.5 C), temperature source Oral, resp. rate 18, height 5\' 7"  (1.702 m), weight 64 kg, SpO2 95%. Body mass index is 22.1 kg/m.  Physical Exam Vitals and nursing note reviewed.  Constitutional:      Appearance: Normal appearance.  HENT:     Head: Normocephalic.     Nose: Nose normal.  Pulmonary:     Effort: Pulmonary effort is normal.  Musculoskeletal:     Cervical back: Normal range of motion.  Neurological:     General: No focal deficit present.     Mental Status: She is alert.     Mental Status Exam: General Appearance: Casual  Orientation:  Full (Time, Place, and Person)  Memory:  Immediate;   Fair Recent;   Fair Remote;   Fair  Concentration:  Concentration: Good and Attention Span: Good  Recall:  Fair  Attention  Good  Eye Contact:  Good  Speech:  Normal Rate  Language:  Good  Volume:  Normal  Mood: depressed, anxious  Affect:  Congruent  Thought Process:  Coherent  Thought Content:  Logical  Suicidal Thoughts:  Yes.  without intent/plan  Homicidal Thoughts:  No  Judgement:  Fair  Insight:   Fair  Psychomotor Activity:  Decreased  Akathisia:  No  Fund of Knowledge:  Fair      Assets:  Leisure Time Resilience Social Support  Cognition:  Impaired,  Mild  ADL's:  Impaired  AIMS (if indicated):        Other History   These have been pulled in through the EMR, reviewed, and updated if appropriate.  Family History:  The patient's family history includes Diabetes in her mother; Heart disease in her mother; Hypertension in her mother; Prostate cancer in her father.  Medical History: Past Medical History:  Diagnosis Date   Chest pain at rest, negative MI, negative stress test, may have been GI 12/06/2013   Diabetes mellitus without complication (HCC)    DOE (dyspnea on exertion), may be due to HTN 12/06/2013   GERD (gastroesophageal  reflux disease)    H/O diabetes mellitus    HTN (hypertension) 12/06/2013   Hypercholesteremia    Hypertension    TIA (transient ischemic attack)     Surgical History: Past Surgical History:  Procedure Laterality Date   CATARACT EXTRACTION, BILATERAL  2018   EYE SURGERY     FEMUR IM NAIL Left 08/15/2023   Procedure: INSERTION, INTRAMEDULLARY ROD, FEMUR;  Surgeon: Claiborne Crew, MD;  Location: [facility];  Service: Orthopedics;  Laterality: Left;   FOOT SURGERY       Medications:   Current Facility-Administered Medications:    0.9 %  sodium chloride  infusion, 250 mL, Intravenous, PRN, Sundil, Subrina, MD   0.9 %  sodium chloride  infusion, , Intravenous, Continuous, Amin, Ankit C, MD, Last Rate: 75 mL/hr at 10/07/23 1122, New Bag at 10/07/23 1122   acetaminophen  (TYLENOL ) tablet 650 mg, 650 mg, Oral, Q6H PRN **OR** acetaminophen  (TYLENOL ) suppository 650 mg, 650 mg, Rectal, Q6H PRN, Sundil, Subrina, MD   atorvastatin  (LIPITOR) tablet 20 mg, 20 mg, Oral, Daily, Sundil, Subrina, MD, 20 mg at 10/07/23 1113   cefTRIAXone  (ROCEPHIN ) 1 g in sodium chloride  0.9 % 100 mL IVPB, 1 g, Intravenous, Q24H, Sundil, Subrina, MD   enoxaparin  (LOVENOX ) injection  30 mg, 30 mg, Subcutaneous, Q24H, Sundil, Subrina, MD, 30 mg at 10/07/23 1117   fluticasone  (FLONASE ) 50 MCG/ACT nasal spray 1 spray, 1 spray, Each Nare, Daily, Sundil, Subrina, MD, 1 spray at 10/07/23 1118   fluticasone  furoate-vilanterol (BREO ELLIPTA ) 200-25 MCG/ACT 1 puff, 1 puff, Inhalation, Daily, Sundil, Subrina, MD, 1 puff at 10/07/23 0839   glucagon (human recombinant) (GLUCAGEN) injection 1 mg, 1 mg, Intravenous, PRN, Amin, Ankit C, MD   haloperidol  lactate (HALDOL ) injection 1 mg, 1 mg, Intravenous, Q6H PRN, Sundil, Subrina, MD   hydrALAZINE  (APRESOLINE ) injection 10 mg, 10 mg, Intravenous, Q4H PRN, Amin, Ankit C, MD   ipratropium-albuterol  (DUONEB) 0.5-2.5 (3) MG/3ML nebulizer solution 3 mL, 3 mL, Nebulization, Q4H PRN, Amin, Ankit C, MD   LORazepam (ATIVAN) injection 1 mg, 1 mg, Intravenous, Once, Amin, Ankit C, MD   methocarbamol  (ROBAXIN ) tablet 500 mg, 500 mg, Oral, Q6H PRN, Sundil, Subrina, MD, 500 mg at 10/07/23 1234   metoprolol  tartrate (LOPRESSOR ) injection 5 mg, 5 mg, Intravenous, Q4H PRN, Amin, Ankit C, MD   mirtazapine (REMERON) tablet 7.5 mg, 7.5 mg, Oral, QHS, Oaklan Persons Y, NP   OLANZapine  (ZYPREXA ) tablet 10 mg, 10 mg, Oral, QHS, Sundil, Subrina, MD, 10 mg at 10/06/23 2336   ondansetron  (ZOFRAN ) tablet 4 mg, 4 mg, Oral, Q6H PRN **OR** ondansetron  (ZOFRAN ) injection 4 mg, 4 mg, Intravenous, Q6H PRN, Sundil, Subrina, MD   polyethylene glycol (MIRALAX  / GLYCOLAX ) packet 17 g, 17 g, Oral, BID, Sundil, Subrina, MD, 17 g at 10/06/23 2336   senna-docusate (Senokot-S) tablet 1 tablet, 1 tablet, Oral, QHS PRN, Amin, Ankit C, MD   sodium chloride  flush (NS) 0.9 % injection 3 mL, 3 mL, Intravenous, Q12H, Sundil, Subrina, MD, 3 mL at 10/07/23 1122   sodium chloride  flush (NS) 0.9 % injection 3 mL, 3 mL, Intravenous, Q12H, Sundil, Subrina, MD, 3 mL at 10/07/23 1122   sodium chloride  flush (NS) 0.9 % injection 3 mL, 3 mL, Intravenous, PRN, Sundil, Subrina, MD  Allergies: Allergies   Allergen Reactions   Tomato Swelling and Rash   Protonix  [Pantoprazole ] Diarrhea    Roslynn Coombes, NP

## 2023-10-07 NOTE — Evaluation (Signed)
 Physical Therapy Evaluation Patient Details Name: Jacqueline Foley MRN: [id number] DOB: 05-21-1933 Today's Date: 10/07/2023  History of Present Illness  89 y.o. female adm 5/8 with medical history significant of generalized anxiety disorder with depression, memory impairment/dementia, polio,left femoral intertrochanteric fracture s/p IM nailing repair 08/15/2023, CKD stage IIIa, hypertension, and hyperlipidemia presented to emergency department for mental status change include increased paranoia and delusion.  Just discharged from [facility], with planned [facility] at daughter's home.  Cleared for WBAT.  Clinical Impression  Pt admitted with above diagnosis.  Pt currently with functional limitations due to the deficits listed below (see PT Problem List). Pt will benefit from acute skilled PT to increase their independence and safety with mobility to allow discharge.     The patient is pleasantly confused, able to participate in  mobility. Patient  should progress to ambulation with +1 assistance.  Patient's daughter present to provide information. Per daughter, patient to Dc to her home. Patient does require 24/7 assistance, not safe with ambulation  without support. Recommend HHPT      If plan is discharge home, recommend the following: A little help with bathing/dressing/bathroom;Two people to help with walking and/or transfers;Assistance with cooking/housework;Assist for transportation;Help with stairs or ramp for entrance;Supervision due to cognitive status   Can travel by private vehicle    yes    Equipment Recommendations None recommended by PT  Recommendations for Other Services       Functional Status Assessment Patient has had a recent decline in their functional status and/or demonstrates limited ability to make significant improvements in function in a reasonable and predictable amount of time     Precautions / Restrictions Precautions Precautions: Fall Precaution/Restrictions Comments:  has recently been fitted for AFO for Left foot, post polio Restrictions LLE Weight Bearing Per Provider Order: Weight bearing as tolerated      Mobility  Bed Mobility               General bed mobility comments: in recliner    Transfers Overall transfer level: Needs assistance Equipment used: Rolling walker (2 wheels) Transfers: Sit to/from Stand Sit to Stand: Min assist           General transfer comment: min assist/ min guard  to stand from recliner, performed x 4 , stepped in place x 10  x 4 sets.    Ambulation/Gait                  Stairs            Wheelchair Mobility     Tilt Bed    Modified Rankin (Stroke Patients Only)       Balance Overall balance assessment: Needs assistance Sitting-balance support: Feet supported Sitting balance-Leahy Scale: Good     Standing balance support: Reliant on assistive device for balance, During functional activity, Bilateral upper extremity supported Standing balance-Leahy Scale: Poor                               Pertinent Vitals/Pain Pain Assessment Faces Pain Scale: Hurts little more Pain Location: L hip , left knee Pain Descriptors / Indicators: Discomfort Pain Intervention(s): Monitored during session, Premedicated before session    Home Living Family/patient expects to be discharged to:: Private residence Living Arrangements: Children Available Help at Discharge: Family;Available 24 hours/day Type of Home: House Home Access: Stairs to enter Entrance Stairs-Rails: Left Entrance Stairs-Number of Steps: 7   Home Layout: One  level Home Equipment: Agricultural consultant (2 wheels);Rollator (4 wheels);Cane - quad;Wheelchair - manual;BSC/3in1 Additional Comments: Pt does have AFO for L LE, for post polio. Patient has her own apartment, but will be staying at daughter's until she regains independence.    Prior Function Prior Level of Function : Needs assist       Physical Assist :  ADLs (physical)   ADLs (physical): Bathing;Dressing;Toileting;IADLs   ADLs Comments: Assist as needed for lower body ADL.  Min A for toielting.  Assist with medications.     Extremity/Trunk Assessment   Upper Extremity Assessment Upper Extremity Assessment: Overall WFL for tasks assessed    Lower Extremity Assessment Lower Extremity Assessment: LLE deficits/detail LLE Deficits / Details: able to flex hip, extend knee, WFL    Cervical / Trunk Assessment Cervical / Trunk Assessment: Kyphotic  Communication   Communication Communication: No apparent difficulties (dtr reports some slurred speech) Factors Affecting Communication: Difficulty expressing self    Cognition Arousal: Alert Behavior During Therapy: WFL for tasks assessed/performed   PT - Cognitive impairments: Memory, Awareness, Orientation, Sequencing, Problem solving   Orientation impairments: Person                   PT - Cognition Comments: knows had left hip surgery Following commands: Impaired Following commands impaired: Follows one step commands with increased time     Cueing Cueing Techniques: Verbal cues, Gestural cues     General Comments      Exercises General Exercises - Lower Extremity Ankle Circles/Pumps: AROM, Both, 10 reps, Seated Long Arc Quad: AROM, Both, 15 reps Hip Flexion/Marching: Both, 10 reps, Seated   Assessment/Plan    PT Assessment Patient needs continued PT services  PT Problem List Decreased strength;Decreased activity tolerance;Decreased mobility;Decreased safety awareness;Decreased cognition;Decreased balance;Decreased knowledge of precautions;Pain       PT Treatment Interventions DME instruction;Therapeutic activities;Stair training;Wheelchair mobility training;Gait training;Functional mobility training;Therapeutic exercise;Patient/family education    PT Goals (Current goals can be found in the Care Plan section)  Acute Rehab PT Goals Patient Stated Goal: to  be  able to walk alone PT Goal Formulation: With patient/family Time For Goal Achievement: 10/21/23 Potential to Achieve Goals: Fair    Frequency Min 3X/week     Co-evaluation               AM-PAC PT "6 Clicks" Mobility  Outcome Measure Help needed turning from your back to your side while in a flat bed without using bedrails?: A Little Help needed moving from lying on your back to sitting on the side of a flat bed without using bedrails?: A Little Help needed moving to and from a bed to a chair (including a wheelchair)?: A Little Help needed standing up from a chair using your arms (e.g., wheelchair or bedside chair)?: A Little Help needed to walk in hospital room?: Total Help needed climbing 3-5 steps with a railing? : Total 6 Click Score: 14    End of Session Equipment Utilized During Treatment: Gait belt Activity Tolerance: Patient tolerated treatment well Patient left: in chair;with call bell/phone within reach;with chair alarm set;with family/visitor present Nurse Communication: Mobility status PT Visit Diagnosis: Unsteadiness on feet (R26.81);History of falling (Z91.81);Difficulty in walking, not elsewhere classified (R26.2)    Time: 0454-0981 PT Time Calculation (min) (ACUTE ONLY): 30 min   Charges:   PT Evaluation $PT Eval Low Complexity: 1 Low PT Treatments $Gait Training: 8-22 mins PT General Charges $$ ACUTE PT VISIT: 1 Visit  Abelina Hoes PT Acute Rehabilitation Services Office [phone] Weekend pager-[phone]   Dareen Ebbing 10/07/2023, 1:59 PM

## 2023-10-08 ENCOUNTER — Inpatient Hospital Stay (HOSPITAL_COMMUNITY)

## 2023-10-08 DIAGNOSIS — F331 Major depressive disorder, recurrent, moderate: Secondary | ICD-10-CM

## 2023-10-08 DIAGNOSIS — G9341 Metabolic encephalopathy: Secondary | ICD-10-CM | POA: Diagnosis not present

## 2023-10-08 LAB — BLOOD CULTURE ID PANEL (REFLEXED) - BCID2

## 2023-10-08 LAB — BASIC METABOLIC PANEL WITH GFR
Anion gap: 7 (ref 5–15)
BUN: 16 mg/dL (ref 8–23)
CO2: 25 mmol/L (ref 22–32)
Calcium: 8.9 mg/dL (ref 8.9–10.3)
Chloride: 110 mmol/L (ref 98–111)
Creatinine, Ser: 0.69 mg/dL (ref 0.44–1.00)
GFR, Estimated: >60 mL/min (ref >60)
Glucose, Bld: 101 mg/dL — ABNORMAL HIGH (ref 70–99)
Potassium: 3.6 mmol/L (ref 3.5–5.1)
Sodium: 142 mmol/L (ref 135–145)

## 2023-10-08 LAB — CBC
HCT: 33.4 % — ABNORMAL LOW (ref 36.0–46.0)
Hemoglobin: 10.1 g/dL — ABNORMAL LOW (ref 12.0–15.0)
MCH: 27.9 pg (ref 26.0–34.0)
MCHC: 30.2 g/dL (ref 30.0–36.0)
MCV: 92.3 fL (ref 80.0–100.0)
Platelets: 297 10*3/uL (ref 150–400)
RBC: 3.62 MIL/uL — ABNORMAL LOW (ref 3.87–5.11)
RDW: 14.1 % (ref 11.5–15.5)
WBC: 5.4 10*3/uL (ref 4.0–10.5)
nRBC: 0 % (ref 0.0–0.2)

## 2023-10-08 LAB — URINE CULTURE: Culture: >=100000 — AB

## 2023-10-08 LAB — FOLATE: Folate: 6.7 ng/mL (ref >5.9)

## 2023-10-08 LAB — MAGNESIUM: Magnesium: 2.1 mg/dL (ref 1.7–2.4)

## 2023-10-08 LAB — PHOSPHORUS: Phosphorus: 3.3 mg/dL (ref 2.5–4.6)

## 2023-10-08 MED ORDER — ENOXAPARIN SODIUM 40 MG/0.4ML IJ SOSY
40.0000 mg | PREFILLED_SYRINGE | INTRAMUSCULAR | Status: DC
Start: 2023-10-09 — End: 2023-10-22
  Administered 2023-10-09 – 2023-10-21 (×13): 40 mg via SUBCUTANEOUS
  Filled 2023-10-08 (×15): qty 0.4

## 2023-10-08 MED ORDER — SODIUM CHLORIDE (PF) 0.9 % IJ SOLN
INTRAMUSCULAR | Status: AC
  Filled 2023-10-08: qty 50

## 2023-10-08 MED ORDER — ESCITALOPRAM OXALATE 10 MG PO TABS
10.0000 mg | ORAL_TABLET | Freq: Every day | ORAL | Status: DC
  Administered 2023-10-08 – 2023-10-21 (×14): 10 mg via ORAL
  Filled 2023-10-08 (×15): qty 1

## 2023-10-08 MED ORDER — IOHEXOL 350 MG/ML SOLN
75.0000 mL | Freq: Once | INTRAVENOUS | Status: AC | PRN
  Administered 2023-10-08: 75 mL via INTRAVENOUS

## 2023-10-08 MED ORDER — GABAPENTIN 100 MG PO CAPS
100.0000 mg | ORAL_CAPSULE | Freq: Three times a day (TID) | ORAL | Status: DC | PRN
  Administered 2023-10-11 – 2023-10-20 (×10): 100 mg via ORAL
  Filled 2023-10-08 (×10): qty 1

## 2023-10-08 MED ORDER — OLANZAPINE 5 MG PO TABS
7.5000 mg | ORAL_TABLET | Freq: Every day | ORAL | Status: DC
  Administered 2023-10-08 – 2023-10-21 (×13): 7.5 mg via ORAL
  Filled 2023-10-08 (×14): qty 2

## 2023-10-08 MED ORDER — CEFAZOLIN SODIUM-DEXTROSE 2-4 GM/100ML-% IV SOLN
2.0000 g | Freq: Three times a day (TID) | INTRAVENOUS | Status: DC
  Administered 2023-10-08 – 2023-10-12 (×13): 2 g via INTRAVENOUS
  Filled 2023-10-08 (×13): qty 100

## 2023-10-08 NOTE — Consult Note (Signed)
 [facility] Psychiatric Consult Follow up  Patient Name: .Jacqueline Foley  MRN: [id number]  DOB: 14-Oct-1932  Consult Order details:  Orders (From admission, onward)     Start     Ordered   10/06/23 2248  IP CONSULT TO PSYCHIATRY       Ordering Provider: Sundil, Subrina, MD  Provider:  (Not yet assigned)  Question Answer Comment  Location [facility]   Reason for Consult? Delusion and hallucination.  History of generalized anxiety disorder and depression      10/06/23 2249             Mode of Visit: In person    Psychiatry Consult Evaluation  Service Date: Oct 08, 2023 LOS:  LOS: 2 days  Chief Complaint "They are worried about mind stuff" that is going on with her.  Primary Psychiatric Diagnoses  MDD, moderate   Assessment  Jacqueline Foley is a 89 y.o. female admitted: Medicallyfor 10/06/2023  7:25 PM for fall post hip surgery. She carries the psychiatric diagnoses of depression, dementia, and anxiety and has a past medical history of "memory impairment/dementia, left femoral intertrochanteric fracture s/p IM nailing repair 08/15/2023, CKD stage IIIa, hypertension, and hyperlipidemia (per MD, Dr. Ulice Gamer) presented to emergency department for mental status change include increased paranoia and delusion.Recently discharged from rehab after left intertrochanteric fracture following a fall. He was having some mental status changes at that time with increased paranoia, delusions and even suicidal ideation. Her primary care doctor has increased her sertraline  dose about 3 weeks ago. She has been living with her daughter since being discharged from rehab yesterday however daughter voices concern for worsening delusions and paranoid behavior. Patient's daughter concerned that patient might have some UTI which is causing this worsening delusion hallucination".    Her current presentation of low motivation and energy, perseverating on the loss of her mother 15 years ago,  poor appetite, dysphoric mood is most consistent with MDD.  Current outpatient psychotropic medications include Lexapro and historically she has had a non response to these medications. She was compliant with medications prior to admission as evidenced by her daughter's report. On initial examination, patient was calm and engaged easily in conversation, dysphoric mood with passive suicidal ideations at times. Please see plan below for detailed recommendations.   Diagnoses:  Active Hospital problems: Principal Problem:   Acute metabolic encephalopathy Active Problems:   Major depressive disorder, recurrent episode, moderate (HCC)   Essential hypertension   History of closed left femoral fracture (HCC)   Hyperlipidemia   Reactive airway disease   Acute cystitis   Generalized anxiety disorder   History of dementia   CKD (chronic kidney disease), stage III (HCC)    Plan   ## Psychiatric Medication Recommendations:  -Increased her Zyprexa  5 mg at bedtime to 7.5 mg at bedtime -Discontinued Remeron 7.5 mg at bedtime for sleep, appetite, and depression -Restarted her Lexapro at 10 mg daily, last given on Wednesday per her daughter at the bedside -Started gabapentin  100 mg TID for anxiety and neuropathic pain  ## Medical Decision Making Capacity: Not specifically addressed in this encounter  ## Further Work-up:  -- most recent EKG on 10/06/23 had QtC of 448. -- Pertinent labwork reviewed earlier this admission includes: CBC and diff, chem panel, U/A, TSH   ## Disposition:-- There are no psychiatric contraindications to discharge at this time  ## Behavioral / Environmental: -Delirium Precautions: Delirium Interventions for Nursing and Staff: - RN to open blinds every AM. -  To Bedside: Glasses, hearing aide, and pt's own shoes. Make available to patients. when possible and encourage use. - Encourage po fluids when appropriate, keep fluids within reach. - OOB to chair with meals. - Passive ROM  exercises to all extremities with AM & PM care. - RN to assess orientation to person, time and place QAM and PRN. - Recommend extended visitation hours with familiar family/friends as feasible. - Staff to minimize disturbances at night. Turn off television when pt asleep or when not in use.    ## Safety and Observation Level:  - Based on my clinical evaluation, I estimate the patient to be at low risk of self harm in the current setting. - At this time, we recommend  routine. This decision is based on my review of the chart including patient's history and current presentation, interview of the patient, mental status examination, and consideration of suicide risk including evaluating suicidal ideation, plan, intent, suicidal or self-harm behaviors, risk factors, and protective factors. This judgment is based on our ability to directly address suicide risk, implement suicide prevention strategies, and develop a safety plan while the patient is in the clinical setting. Please contact our team if there is a concern that risk level has changed.  CSSR Risk Category:C-SSRS RISK CATEGORY: No Risk  Suicide Risk Assessment: Patient has following modifiable risk factors for suicide: under treated depression , which we are addressing by starting medications. Patient has following non-modifiable or demographic risk factors for suicide: none Patient has the following protective factors against suicide: Supportive family, Frustration tolerance, no history of suicide attempts, and no history of NSSIB  Thank you for this consult request. Recommendations have been communicated to the primary team.  We will sign off at this time.   Roslynn Coombes, NP       History of Present Illness  Relevant Aspects of [facility] Course:  Admitted on 10/06/2023 for UTI and paranoia with hallucinations.   Patient Report:  The client was alert and oriented on assessment.  She was able to provide a history and timeline without  issues.  When asked what brought her to the hospital, she responded with "Falling at my house and I don't know why, I had my cane".  She recently fell and broke her hip requiring surgery and follow up rehab that she completed.  Now, her left leg is causing pain including her knee.  She communicated that she is moving to a SNF and worried about how she will adjust to this as she has been living alone which she enjoys. "I do worry a lot."  She did endorse depression that increase with the recent events and told her daughter she was having suicidal ideations.  Today, these are not active suicidal ideations, passive ones that are intermittent.  Her sleep is not good, her pain does not help.  Appetite is also decreased with significant weight loss.  Caveat:  Her daughter was concerned about paranoia and hallucinations on admission and thought she may have a UTI.  She does have some leukocytes in her urine and being treated currently with antibiotics.  On assessment, she reported seeing things at times but not today.  She stated she saw her aunt on the wall prior to admission and knew it was not real.  Discussed medications, specifically Remeron, for appetite, sleep, and depression which she was agreeable.  She was very pleasant and cooperative. This provider will follow up with her daughter tomorrow to see if the UTI treatment cleared  most of her symptoms along with the Zyprexa  the attending started (dose decreased by this provider from 10 mg to 5 mg).     10/08/2023: The client was drowsy on assessment with her daughter, [name], at her bedside.  The patient was drowsy and then proceeded to fall asleep with snoring noted.  Long discussion with her daughter who has been caring for her.  She reported she was doing well with changes noted neurologically in the past year with neurology work-ups and follow-ups.  Keirston was diagnosed with dementia last year and experiencing some memory issues at times along with paranoia  at times that people are taking her things when she cannot find them in her apartment.  She has been more "fidgety" the past few months with her mood improving with her daughter having her assist with folding clothes and other simple tasks.  Prior to hospitalization, she had fallen and broken her hip with repair and then sent to rehab before returning home. In the process, she was confused at times with occasional hallucinations, restlessness, and paranoia.    The MD started Zyprexa  which helped along with her sleep but slept too much.  This provider decreased the dose last night and her sleep was poor.  Discussed increasing it to the mid dose of 7.5 mg along with cardiac events among other risks of the Zyprexa  along with risk/benefit.  The daughter agreed along with restarting her Lexapro that she was on prior to admission and discontinuing the Remeron as it was not helpful.  Also discussed gabapentin  PRN which she has taken in the past for neuropathic pain and daughter agreed as she continues to have pain and anxiety at times.  Her mother has become "fixated" on her weight with an intentional food restriction.  The daughter continues to strive to get her to eat.    Discussed dementia progression and her current delirium from her UTI compounding her symptoms.  Provided her daughter with dementia resources online via [employer] about the disease and care of people with dementia to which her daughter was very interested.  This provider also demonstrated the dementia handshake with the daughter practicing to assist in calming the patient while supporting her during anxious/agitated states.  Also processed with the daughter the next steps of a facility as her lease is over in May and she is struggling to care for her family and her mother's increase needs.  She will discuss the next steps with the social worker, TOC placed, with plan for her to return home to her care while things are in process.     Psych ROS:   Depression: moderate Anxiety:  mild to moderate Mania (lifetime and current): none Psychosis: (lifetime and current): none  Review of Systems  Constitutional: Negative.   HENT: Negative.    Eyes: Negative.   Respiratory: Negative.    Cardiovascular: Negative.   Gastrointestinal: Negative.   Genitourinary: Negative.   Musculoskeletal:  Positive for falls.       Hip pain  Skin: Negative.   Neurological: Negative.   Endo/Heme/Allergies: Negative.   Psychiatric/Behavioral:  Positive for depression and memory loss. The patient is nervous/anxious.      Psychiatric and Social History  Psychiatric History:  Information collected from patient, chart  Prev Dx/Sx: dementia Current Psych Provider: none Home Meds (current): none Previous Med Trials: none Therapy: none  Prior Psych Hospitalization: none  Prior Self Harm: none Prior Violence: none  Family Psych History: none Family Hx suicide: none  Social History:  Occupational Hx: retired Equities trader: living alone, now moving to a SNF  Access to weapons/lethal means: none   Substance History Denied substance use  Exam Findings  Physical Exam: Completed by the MD, reviewed Vital Signs:  Temp:  [97.6 F (36.4 C)-98.2 F (36.8 C)] 97.9 F (36.6 C) (05/10 0444) Pulse Rate:  [89-105] 89 (05/10 0444) Resp:  [16-20] 20 (05/10 0444) BP: (141-175)/(95-110) 175/95 (05/10 0444) SpO2:  [95 %-100 %] 99 % (05/10 0444) Blood pressure (!) 175/95, pulse 89, temperature 97.9 F (36.6 C), resp. rate 20, height 5\' 7"  (1.702 m), weight 64 kg, SpO2 99%. Body mass index is 22.1 kg/m.  Physical Exam Vitals and nursing note reviewed.  Constitutional:      Appearance: Normal appearance.  HENT:     Head: Normocephalic.     Nose: Nose normal.  Pulmonary:     Effort: Pulmonary effort is normal.  Musculoskeletal:     Cervical back: Normal range of motion.  Neurological:     General: No focal deficit present.     Mental Status:  She is alert.     Mental Status Exam: General Appearance: Casual  Orientation:  Full (Time, Place, and Person)  Memory:  Immediate;   Fair Recent;   Fair Remote;   Fair  Concentration:  Concentration: Good and Attention Span: Good  Recall:  Fair  Attention  Good  Eye Contact:  Good  Speech:  Normal Rate  Language:  Good  Volume:  Normal  Mood: depressed, anxious  Affect:  Congruent  Thought Process:  Coherent  Thought Content:  Logical  Suicidal Thoughts:  Yes.  without intent/plan  Homicidal Thoughts:  No  Judgement:  Fair  Insight:  Fair  Psychomotor Activity:  Decreased  Akathisia:  No  Fund of Knowledge:  Fair      Assets:  Leisure Time Resilience Social Support  Cognition:  Impaired,  Mild  ADL's:  Impaired  AIMS (if indicated):        Other History   These have been pulled in through the EMR, reviewed, and updated if appropriate.  Family History:  The patient's family history includes Diabetes in her mother; Heart disease in her mother; Hypertension in her mother; Prostate cancer in her father.  Medical History: Past Medical History:  Diagnosis Date   Chest pain at rest, negative MI, negative stress test, may have been GI 12/06/2013   Diabetes mellitus without complication (HCC)    DOE (dyspnea on exertion), may be due to HTN 12/06/2013   GERD (gastroesophageal reflux disease)    H/O diabetes mellitus    HTN (hypertension) 12/06/2013   Hypercholesteremia    Hypertension    TIA (transient ischemic attack)     Surgical History: Past Surgical History:  Procedure Laterality Date   CATARACT EXTRACTION, BILATERAL  2018   EYE SURGERY     FEMUR IM NAIL Left 08/15/2023   Procedure: INSERTION, INTRAMEDULLARY ROD, FEMUR;  Surgeon: Claiborne Crew, MD;  Location: [facility];  Service: Orthopedics;  Laterality: Left;   FOOT SURGERY       Medications:   Current Facility-Administered Medications:    0.9 %  sodium chloride  infusion, , Intravenous, Continuous, Amin,  Ankit C, MD, Stopped at 10/07/23 2250   acetaminophen  (TYLENOL ) tablet 650 mg, 650 mg, Oral, Q6H PRN, 650 mg at 10/07/23 2217 **OR** acetaminophen  (TYLENOL ) suppository 650 mg, 650 mg, Rectal, Q6H PRN, Sundil, Subrina, MD   atorvastatin  (LIPITOR) tablet 20 mg, 20 mg, Oral,  Daily, Sundil, Subrina, MD, 20 mg at 10/07/23 1113   cefTRIAXone  (ROCEPHIN ) 1 g in sodium chloride  0.9 % 100 mL IVPB, 1 g, Intravenous, Q24H, Sundil, Subrina, MD, Stopped at 10/07/23 2245   enoxaparin  (LOVENOX ) injection 30 mg, 30 mg, Subcutaneous, Q24H, Sundil, Subrina, MD, 30 mg at 10/07/23 1117   fluticasone  (FLONASE ) 50 MCG/ACT nasal spray 1 spray, 1 spray, Each Nare, Daily, Sundil, Subrina, MD, 1 spray at 10/07/23 1118   fluticasone  furoate-vilanterol (BREO ELLIPTA ) 200-25 MCG/ACT 1 puff, 1 puff, Inhalation, Daily, Sundil, Subrina, MD, 1 puff at 10/07/23 0839   glucagon (human recombinant) (GLUCAGEN) injection 1 mg, 1 mg, Intravenous, PRN, Amin, Ankit C, MD   haloperidol  lactate (HALDOL ) injection 1 mg, 1 mg, Intravenous, Q6H PRN, Sundil, Subrina, MD   hydrALAZINE  (APRESOLINE ) injection 10 mg, 10 mg, Intravenous, Q4H PRN, Amin, Ankit C, MD   ipratropium-albuterol  (DUONEB) 0.5-2.5 (3) MG/3ML nebulizer solution 3 mL, 3 mL, Nebulization, Q4H PRN, Amin, Ankit C, MD   LORazepam (ATIVAN) injection 1 mg, 1 mg, Intravenous, Once, Amin, Ankit C, MD   methocarbamol  (ROBAXIN ) tablet 500 mg, 500 mg, Oral, Q6H PRN, Sundil, Subrina, MD, 500 mg at 10/07/23 2218   metoprolol  tartrate (LOPRESSOR ) injection 5 mg, 5 mg, Intravenous, Q4H PRN, Amin, Ankit C, MD   mirtazapine (REMERON) tablet 7.5 mg, 7.5 mg, Oral, QHS, Talaysha Freeberg Y, NP, 7.5 mg at 10/07/23 2218   OLANZapine  (ZYPREXA ) tablet 5 mg, 5 mg, Oral, QHS, Anagha Loseke Y, NP, 5 mg at 10/07/23 2221   ondansetron  (ZOFRAN ) tablet 4 mg, 4 mg, Oral, Q6H PRN **OR** ondansetron  (ZOFRAN ) injection 4 mg, 4 mg, Intravenous, Q6H PRN, Sundil, Subrina, MD   polyethylene glycol (MIRALAX  / GLYCOLAX )  packet 17 g, 17 g, Oral, BID, Sundil, Subrina, MD, 17 g at 10/07/23 2216   senna-docusate (Senokot-S) tablet 1 tablet, 1 tablet, Oral, QHS PRN, Amin, Ankit C, MD   sodium chloride  flush (NS) 0.9 % injection 3 mL, 3 mL, Intravenous, Q12H, Sundil, Subrina, MD, 3 mL at 10/07/23 1122   sodium chloride  flush (NS) 0.9 % injection 3 mL, 3 mL, Intravenous, Q12H, Sundil, Subrina, MD, 3 mL at 10/07/23 2222   sodium chloride  flush (NS) 0.9 % injection 3 mL, 3 mL, Intravenous, PRN, Sundil, Subrina, MD  Allergies: Allergies  Allergen Reactions   Tomato Swelling and Rash   Protonix  [Pantoprazole ] Diarrhea    Roslynn Coombes, NP

## 2023-10-08 NOTE — Plan of Care (Signed)

## 2023-10-08 NOTE — Progress Notes (Signed)
 PHARMACY - PHYSICIAN COMMUNICATION CRITICAL VALUE ALERT - BLOOD CULTURE IDENTIFICATION (BCID)  Jacqueline Foley is an 89 y.o. female who presented to [facility] on 10/06/2023 with acute metabolic encephalopathy likely in the setting of UTI  Assessment:  BCID + Streptococcus species (no resistance) in 1 out of 4 bottles. Possible contaminant  Name of physician (or Provider) Contacted: Bailey Lesser, FNP  Current antibiotics: Ceftriaxone  1g IV q24h  Changes to prescribed antibiotics recommended:  Patient is on recommended antibiotics - No changes needed  Results for orders placed or performed during the hospital encounter of 10/06/23  Blood Culture ID Panel (Reflexed) (Collected: 10/06/2023 10:49 PM)  Result Value Ref Range   Enterococcus faecalis NOT DETECTED NOT DETECTED   Enterococcus Faecium NOT DETECTED NOT DETECTED   Listeria monocytogenes NOT DETECTED NOT DETECTED   Staphylococcus species NOT DETECTED NOT DETECTED   Staphylococcus aureus (BCID) NOT DETECTED NOT DETECTED   Staphylococcus epidermidis NOT DETECTED NOT DETECTED   Staphylococcus lugdunensis NOT DETECTED NOT DETECTED   Streptococcus species DETECTED (A) NOT DETECTED   Streptococcus agalactiae NOT DETECTED NOT DETECTED   Streptococcus pneumoniae NOT DETECTED NOT DETECTED   Streptococcus pyogenes NOT DETECTED NOT DETECTED   A.calcoaceticus-baumannii NOT DETECTED NOT DETECTED   Bacteroides fragilis NOT DETECTED NOT DETECTED   Enterobacterales NOT DETECTED NOT DETECTED   Enterobacter cloacae complex NOT DETECTED NOT DETECTED   Escherichia coli NOT DETECTED NOT DETECTED   Klebsiella aerogenes NOT DETECTED NOT DETECTED   Klebsiella oxytoca NOT DETECTED NOT DETECTED   Klebsiella pneumoniae NOT DETECTED NOT DETECTED   Proteus species NOT DETECTED NOT DETECTED   Salmonella species NOT DETECTED NOT DETECTED   Serratia marcescens NOT DETECTED NOT DETECTED   Haemophilus influenzae NOT DETECTED NOT DETECTED   Neisseria  meningitidis NOT DETECTED NOT DETECTED   Pseudomonas aeruginosa NOT DETECTED NOT DETECTED   Stenotrophomonas maltophilia NOT DETECTED NOT DETECTED   Candida albicans NOT DETECTED NOT DETECTED   Candida auris NOT DETECTED NOT DETECTED   Candida glabrata NOT DETECTED NOT DETECTED   Candida krusei NOT DETECTED NOT DETECTED   Candida parapsilosis NOT DETECTED NOT DETECTED   Candida tropicalis NOT DETECTED NOT DETECTED   Cryptococcus neoformans/gattii NOT DETECTED NOT DETECTED    Rulon Councilman, PharmD 10/08/2023  12:59 AM

## 2023-10-08 NOTE — TOC Progression Note (Signed)
 Transition of Care [facility]) - Progression Note    Patient Details  Name: Jacqueline Foley MRN: [id number] Date of Birth: 04-05-1933  Transition [facility]) CM/SW Contact  Katrine Parody, [place] Phone Number: 10/08/2023, 3:07 PM  Clinical Narrative:    CSW received a [place] from RN that daughter would like to speak with me. We eventually spoke and daughter provided some hx and has some continued questions. Pt was at [facility] from 3/20 to 5/8.  Medicare DC pt financially 4/24 -Prior to that daughter had  2 successful appeals, extending the stay. 4/24-5/8 pt was self pay no therapeutic services were offered, per daughter. Daughter and husband are not certain they can manage care at home with HHPT only.  Daughter has two specific questions. First question is if recommended- to return to SNF will Medicare pay/approve  since she discharged 4/24 and second- can she be provided memory care facilities to look into when the time comes that the Dementia has increased and pt may need care.  CSW advised daughter that Medicare is the best option to discuss care plan and coverage- but that I would make a note of concern to offer support planning. Daughter feels this hospital stay is a new episode and is interested in pt DC to SNF. TOC to follow.    Expected Discharge Plan: Home w Home Health Services Barriers to Discharge: Continued Medical Work up  Expected Discharge Plan and Services In-house Referral: Chaplain Discharge Planning Services: CM Consult Post Acute Care Choice: NA Living arrangements for the past 2 months: Assisted Living Facility Administrator Living)                 DME Arranged: N/A DME Agency: NA       HH Arranged: NA HH Agency: NA         Social Determinants of Health (SDOH) Interventions SDOH Screenings   Food Insecurity: No Food Insecurity (10/06/2023)  Housing: Low Risk  (10/06/2023)  Transportation Needs: No Transportation Needs (10/06/2023)  Utilities: Not At Risk  (10/06/2023)  Social Connections: Socially Isolated (10/06/2023)  Tobacco Use: Low Risk  (10/06/2023)    Readmission Risk Interventions    10/07/2023    2:59 PM 08/14/2023    8:41 AM  Readmission Risk Prevention Plan  Transportation Screening Complete Complete  PCP or Specialist Appt within 5-7 Days Complete Complete  Home Care Screening Complete Complete  Medication Review (RN CM) Complete Complete

## 2023-10-08 NOTE — Progress Notes (Signed)
 PROGRESS NOTE    Jacqueline Foley  ZOX:[id number] DOB: 11-05-1932 DOA: 10/06/2023 PCP: [name], DO    Brief Narrative:  89 year old with history of GAD, depression, dementia/memory impairment, left hip fracture status post IM nailing 08/15/2023, CKD 3A, HTN, HLD comes to the ED for evaluation of altered mental status/paranoia and delusion.  Upon admission basic metabolic workup was overall unremarkable besides urinary tract infection.  Started on IV Rocephin .   Assessment & Plan:  Principal Problem:   Acute metabolic encephalopathy Active Problems:   History of closed left femoral fracture (HCC)   Acute cystitis   Essential hypertension   Hyperlipidemia   Reactive airway disease   Generalized anxiety disorder   History of dementia   CKD (chronic kidney disease), stage III (HCC)   Acute metabolic encephalopathy-UTI vs dementia progression vs delusion/hallucination/GAD disorder Delusion and hallucination Paranoid behavior History of generalized anxiety disorder -Patient does not have any focal deficit.  I suspect this is delirium induced by possible underlying urinary tract infection.  Seen by psychiatry.  Increase Zyprexa , restarted Lexapro, started gabapentin  and discontinued Remeron. - B12 ammonia and TSH are normal -Gentle hydration - MRI brain, CTA head and neck are pending   Acute cystitis, E. coli - Cultures for E. coli are pansensitive, continue current antibiotics.  Hypokalemia - As needed repletion   Essential hypertension - Hold Maxide as patient is getting gentle hydration.  IV as needed   Hyperlipidemia -Continue Lipitor   Reactive airway disease Stable.  As needed bronchodilators   CKD stage IIIa -Creatinine 0.9 and GFR 58.  Renal function at baseline.  Continue to monitor   Left proximal fever intertrochanteric fracture secondary to fall -Status post IM nailing by Dr. Bernard Brick 08/15/2023.   PT/OT   DVT prophylaxis: Lovenox     Code Status: Full  Code Family Communication: Family at bedside Status is: Inpatient Remains inpatient appropriate because: Continue hospital stay for at least 2 days    Subjective:  Overnight patient had some signs of agitation requiring Zyprexa  This morning resting comfortably.  Daughter is present at bedside  Examination:  General exam: Appears calm and comfortable, elderly frail Respiratory system: Clear to auscultation. Respiratory effort normal. Cardiovascular system: S1 & S2 heard, RRR. No JVD, murmurs, rubs, gallops or clicks. No pedal edema. Gastrointestinal system: Abdomen is nondistended, soft and nontender. No organomegaly or masses felt. Normal bowel sounds heard. Central nervous system: Alert and oriented to name and place.  No obvious focal neurodeficits Extremities: Symmetric 5 x 5 power. Skin: No rashes, lesions or ulcers Psychiatry: Judgement and insight appear poor but follows all commands appropriately                Diet Orders (From admission, onward)     Start     Ordered   10/06/23 2251  Diet Heart Room service appropriate? Yes; Fluid consistency: Thin  Diet effective now       Question Answer Comment  Room service appropriate? Yes   Fluid consistency: Thin      10/06/23 2250            Objective: Vitals:   10/07/23 2352 10/08/23 0444 10/08/23 1032 10/08/23 1106  BP: (!) 175/110 (!) 175/95  (!) 136/100  Pulse: (!) 102 89  (!) 116  Resp: 18 20    Temp:  97.9 F (36.6 C)    TempSrc:      SpO2: 99% 99% 97% 100%  Weight:      Height:  Intake/Output Summary (Last 24 hours) at 10/08/2023 1114 Last data filed at 10/08/2023 0219 Gross per 24 hour  Intake 915.16 ml  Output --  Net 915.16 ml   Filed Weights   10/06/23 2316  Weight: 64 kg    Scheduled Meds:  atorvastatin   20 mg Oral Daily   enoxaparin  (LOVENOX ) injection  30 mg Subcutaneous Q24H   escitalopram  10 mg Oral Daily   fluticasone   1 spray Each Nare Daily   fluticasone   furoate-vilanterol  1 puff Inhalation Daily   LORazepam  1 mg Intravenous Once   OLANZapine   7.5 mg Oral QHS   polyethylene glycol  17 g Oral BID   sodium chloride  flush  3 mL Intravenous Q12H   sodium chloride  flush  3 mL Intravenous Q12H   Continuous Infusions:  cefTRIAXone  (ROCEPHIN )  IV Stopped (10/07/23 2245)    Nutritional status     Body mass index is 22.1 kg/m.  Data Reviewed:   CBC: Recent Labs  Lab 10/06/23 2036 10/07/23 0423 10/08/23 0535  WBC 6.9 5.8 5.4  NEUTROABS 4.2  --   --   HGB 11.3* 10.1* 10.1*  HCT 37.2 34.0* 33.4*  MCV 92.1 94.2 92.3  PLT 384 311 297   Basic Metabolic Panel: Recent Labs  Lab 10/06/23 2036 10/07/23 0423 10/08/23 0535  NA 139 139 142  K 4.1 3.4* 3.6  CL 104 106 110  CO2 26 25 25   GLUCOSE 108* 103* 101*  BUN 25* 22 16  CREATININE 0.94 0.70 0.69  CALCIUM  9.6 8.9 8.9  MG  --   --  2.1  PHOS  --   --  3.3   GFR: Estimated Creatinine Clearance: 47.3 mL/min (by C-G formula based on SCr of 0.69 mg/dL). Liver Function Tests: Recent Labs  Lab 10/06/23 2036  AST 23  ALT 19  ALKPHOS 101  BILITOT 0.6  PROT 7.8  ALBUMIN 4.0   No results for input(s): "LIPASE", "AMYLASE" in the last 168 hours. Recent Labs  Lab 10/07/23 1016  AMMONIA 18   Coagulation Profile: No results for input(s): "INR", "PROTIME" in the last 168 hours. Cardiac Enzymes: No results for input(s): "CKTOTAL", "CKMB", "CKMBINDEX", "TROPONINI" in the last 168 hours. BNP (last 3 results) No results for input(s): "PROBNP" in the last 8760 hours. HbA1C: No results for input(s): "HGBA1C" in the last 72 hours. CBG: No results for input(s): "GLUCAP" in the last 168 hours. Lipid Profile: No results for input(s): "CHOL", "HDL", "LDLCALC", "TRIG", "CHOLHDL", "LDLDIRECT" in the last 72 hours. Thyroid  Function Tests: Recent Labs    10/07/23 1016  TSH 0.990   Anemia Panel: Recent Labs    10/07/23 1016 10/08/23 0535  VITAMINB12 574  --   FOLATE  --  6.7    Sepsis Labs: Recent Labs  Lab 10/06/23 2258  LATICACIDVEN 1.0    Recent Results (from the past 240 hours)  Urine Culture     Status: Abnormal   Collection Time: 10/06/23  9:18 PM   Specimen: Urine, Catheterized  Result Value Ref Range Status   Specimen Description   Final    URINE, CATHETERIZED Performed at [facility], [address] [place], [place] [address]    Special Requests   Final    NONE Performed at [facility], [address], [place], [place] [address]    Culture >=100,000 COLONIES/mL ESCHERICHIA COLI (A)  Final   Report Status 10/08/2023 FINAL  Final   Organism ID, Bacteria ESCHERICHIA COLI (  A)  Final      Susceptibility   Escherichia coli - MIC*    AMPICILLIN 16 INTERMEDIATE Intermediate     CEFAZOLIN  <=4 SENSITIVE Sensitive     CEFEPIME <=0.12 SENSITIVE Sensitive     CEFTRIAXONE  <=0.25 SENSITIVE Sensitive     CIPROFLOXACIN  <=0.25 SENSITIVE Sensitive     GENTAMICIN <=1 SENSITIVE Sensitive     IMIPENEM <=0.25 SENSITIVE Sensitive     NITROFURANTOIN  <=16 SENSITIVE Sensitive     TRIMETH/SULFA >=320 RESISTANT Resistant     AMPICILLIN/SULBACTAM 8 SENSITIVE Sensitive     PIP/TAZO 8 SENSITIVE Sensitive ug/mL    * >=100,000 COLONIES/mL ESCHERICHIA COLI  Culture, blood (routine x 2)     Status: None (Preliminary result)   Collection Time: 10/06/23 10:49 PM   Specimen: BLOOD  Result Value Ref Range Status   Specimen Description   Final    BLOOD RIGHT ANTECUBITAL Performed at [facility], [address], [place], [place] [address]    Special Requests   Final    BOTTLES DRAWN AEROBIC AND ANAEROBIC Blood Culture results may not be optimal due to an inadequate volume of blood received in culture bottles Performed at [facility], [address] [place], [place] [address]    Culture  Setup Time   Final    GRAM POSITIVE COCCI AEROBIC BOTTLE ONLY Organism ID to follow CRITICAL RESULT  CALLED TO, READ BACK BY AND VERIFIED WITH: L POINDEXTER,[email or web address]  10/08/23 MK    Culture   Final    GRAM POSITIVE COCCI IDENTIFICATION TO FOLLOW Performed at [facility], [address] [place], [place] [address]    Report Status PENDING  Incomplete  Culture, blood (routine x 2)     Status: None (Preliminary result)   Collection Time: 10/06/23 10:49 PM   Specimen: BLOOD LEFT HAND  Result Value Ref Range Status   Specimen Description   Final    BLOOD LEFT HAND Performed at [facility], [address], [place], [place] [address]    Special Requests   Final    BOTTLES DRAWN AEROBIC AND ANAEROBIC Blood Culture results may not be optimal due to an inadequate volume of blood received in culture bottles Performed at [facility], [address], [place], [place] [address]    Culture   Final    NO GROWTH 1 DAY Performed at [facility], [address], [place], [place] [address]    Report Status PENDING  Incomplete  Blood Culture ID Panel (Reflexed)     Status: Abnormal   Collection Time: 10/06/23 10:49 PM  Result Value Ref Range Status   Enterococcus faecalis NOT DETECTED NOT DETECTED Final   Enterococcus Faecium NOT DETECTED NOT DETECTED Final   Listeria monocytogenes NOT DETECTED NOT DETECTED Final   Staphylococcus species NOT DETECTED NOT DETECTED Final   Staphylococcus aureus (BCID) NOT DETECTED NOT DETECTED Final   Staphylococcus epidermidis NOT DETECTED NOT DETECTED Final   Staphylococcus lugdunensis NOT DETECTED NOT DETECTED Final   Streptococcus species DETECTED (A) NOT DETECTED Final    Comment: Not Enterococcus species, Streptococcus agalactiae, Streptococcus pyogenes, or Streptococcus pneumoniae. CRITICAL RESULT CALLED TO, READ BACK BY AND VERIFIED WITH: L POINDEXTER,[email or web address]  10/08/23 MK    Streptococcus agalactiae NOT DETECTED NOT DETECTED Final   Streptococcus pneumoniae NOT DETECTED NOT DETECTED Final   Streptococcus  pyogenes NOT DETECTED NOT DETECTED Final   A.calcoaceticus-baumannii NOT DETECTED NOT DETECTED Final   Bacteroides fragilis NOT DETECTED  NOT DETECTED Final   Enterobacterales NOT DETECTED NOT DETECTED Final   Enterobacter cloacae complex NOT DETECTED NOT DETECTED Final   Escherichia coli NOT DETECTED NOT DETECTED Final   Klebsiella aerogenes NOT DETECTED NOT DETECTED Final   Klebsiella oxytoca NOT DETECTED NOT DETECTED Final   Klebsiella pneumoniae NOT DETECTED NOT DETECTED Final   Proteus species NOT DETECTED NOT DETECTED Final   Salmonella species NOT DETECTED NOT DETECTED Final   Serratia marcescens NOT DETECTED NOT DETECTED Final   Haemophilus influenzae NOT DETECTED NOT DETECTED Final   Neisseria meningitidis NOT DETECTED NOT DETECTED Final   Pseudomonas aeruginosa NOT DETECTED NOT DETECTED Final   Stenotrophomonas maltophilia NOT DETECTED NOT DETECTED Final   Candida albicans NOT DETECTED NOT DETECTED Final   Candida auris NOT DETECTED NOT DETECTED Final   Candida glabrata NOT DETECTED NOT DETECTED Final   Candida krusei NOT DETECTED NOT DETECTED Final   Candida parapsilosis NOT DETECTED NOT DETECTED Final   Candida tropicalis NOT DETECTED NOT DETECTED Final   Cryptococcus neoformans/gattii NOT DETECTED NOT DETECTED Final    Comment: Performed at [facility], [address], [place], [place] [address]         Radiology Studies: No results found.         LOS: 2 days   Time spent= 35 mins    Maggie Schooner, MD [facility]  If 7PM-7AM, please contact night-coverage  10/08/2023, 11:14 AM

## 2023-10-08 NOTE — Progress Notes (Signed)
 Ct called about 6 am for pt STAT CT.  Took pt down and both tech and myself tried multiple times to start IV on pt and was unsuccessful.  Returned pt to room and placed an IV team order.

## 2023-10-08 NOTE — Progress Notes (Signed)
 At 1344 on 5/9 a STAT CT was ordered for this pt.  Day shift nurse Ivin Marrow) stated in shift report around 1500 one person was on the table getting a CT and this pt would be next.  Nurse stated that was last time she was able to speak with someone in CT.  After getting shift report, I asked the secretary for this unit to call and see what the hold up for the MRI and CT and when this pt would be able to get them done.  MRI called back and stated that they could get it done if someone brought the pt down- we were not able to speak with someone from CT.   Currently, the MRI was completed but the STAT CT has not be finished.  MRI has a sign posted showing hours M-F 0530-1900 and Sat & Sun hrs 1000 -1900. We have called CT multiple times and wasn't able to get a hold of anyone.

## 2023-10-09 ENCOUNTER — Inpatient Hospital Stay (HOSPITAL_COMMUNITY)

## 2023-10-09 DIAGNOSIS — R7881 Bacteremia: Secondary | ICD-10-CM

## 2023-10-09 DIAGNOSIS — G9341 Metabolic encephalopathy: Secondary | ICD-10-CM | POA: Diagnosis not present

## 2023-10-09 LAB — CBC
HCT: 37.7 % (ref 36.0–46.0)
Hemoglobin: 11.2 g/dL — ABNORMAL LOW (ref 12.0–15.0)
MCH: 27.8 pg (ref 26.0–34.0)
MCHC: 29.7 g/dL — ABNORMAL LOW (ref 30.0–36.0)
MCV: 93.5 fL (ref 80.0–100.0)
Platelets: 320 10*3/uL (ref 150–400)
RBC: 4.03 MIL/uL (ref 3.87–5.11)
RDW: 14.1 % (ref 11.5–15.5)
WBC: 6.5 10*3/uL (ref 4.0–10.5)
nRBC: 0 % (ref 0.0–0.2)

## 2023-10-09 LAB — ECHOCARDIOGRAM COMPLETE
AR max vel: 1.6 cm2
AV Area VTI: 1.28 cm2
AV Area mean vel: 1.35 cm2
AV Mean grad: 3 mmHg
AV Peak grad: 6.2 mmHg
Ao pk vel: 1.24 m/s
Area-P 1/2: 4.57 cm2
Calc EF: 61.1 %
Height: 67 in
MV VTI: 1.67 cm2
S' Lateral: 2.2 cm
Single Plane A2C EF: 54.1 %
Single Plane A4C EF: 68.9 %
Weight: 2257.51 [oz_av]

## 2023-10-09 LAB — BASIC METABOLIC PANEL WITH GFR
Anion gap: 11 (ref 5–15)
BUN: 18 mg/dL (ref 8–23)
CO2: 23 mmol/L (ref 22–32)
Calcium: 9.4 mg/dL (ref 8.9–10.3)
Chloride: 107 mmol/L (ref 98–111)
Creatinine, Ser: 0.77 mg/dL (ref 0.44–1.00)
GFR, Estimated: >60 mL/min (ref >60)
Glucose, Bld: 100 mg/dL — ABNORMAL HIGH (ref 70–99)
Potassium: 3.6 mmol/L (ref 3.5–5.1)
Sodium: 141 mmol/L (ref 135–145)

## 2023-10-09 LAB — CULTURE, BLOOD (ROUTINE X 2)

## 2023-10-09 LAB — MAGNESIUM: Magnesium: 2.4 mg/dL (ref 1.7–2.4)

## 2023-10-09 MED ORDER — DM-GUAIFENESIN ER 30-600 MG PO TB12
1.0000 | ORAL_TABLET | Freq: Two times a day (BID) | ORAL | Status: DC | PRN
  Administered 2023-10-09 – 2023-10-21 (×14): 1 via ORAL
  Filled 2023-10-09 (×15): qty 1

## 2023-10-09 MED ORDER — HALOPERIDOL LACTATE 5 MG/ML IJ SOLN: 2.0000 mg | Freq: Once | INTRAMUSCULAR | Status: DC

## 2023-10-09 MED ORDER — CARVEDILOL 6.25 MG PO TABS
6.2500 mg | ORAL_TABLET | Freq: Two times a day (BID) | ORAL | Status: DC
Start: 2023-10-09 — End: 2023-10-22
  Administered 2023-10-09 – 2023-10-21 (×26): 6.25 mg via ORAL
  Filled 2023-10-09: qty 2
  Filled 2023-10-09 (×4): qty 1
  Filled 2023-10-09 (×8): qty 2
  Filled 2023-10-09 (×3): qty 1
  Filled 2023-10-09 (×3): qty 2
  Filled 2023-10-09: qty 1
  Filled 2023-10-09: qty 2
  Filled 2023-10-09 (×4): qty 1
  Filled 2023-10-09 (×2): qty 2

## 2023-10-09 MED ORDER — ACETAMINOPHEN 325 MG PO TABS
325.0000 mg | ORAL_TABLET | Freq: Two times a day (BID) | ORAL | Status: DC | PRN
  Administered 2023-10-14: 325 mg via ORAL

## 2023-10-09 MED ORDER — PANTOPRAZOLE SODIUM 40 MG PO TBEC
40.0000 mg | DELAYED_RELEASE_TABLET | Freq: Every day | ORAL | Status: DC
  Administered 2023-10-09 – 2023-10-21 (×13): 40 mg via ORAL
  Filled 2023-10-09 (×14): qty 1

## 2023-10-09 NOTE — Progress Notes (Signed)
 PROGRESS NOTE    Jacqueline Foley  ZOX:[id number] DOB: 22-Nov-1932 DOA: 10/06/2023 PCP: [name], DO    Brief Narrative:  89 year old with history of GAD, depression, dementia/memory impairment, left hip fracture status post IM nailing 08/15/2023, CKD 3A, HTN, HLD comes to the ED for evaluation of altered mental status/paranoia and delusion.  Upon admission basic metabolic workup was overall unremarkable besides urinary tract infection.  Started on IV Rocephin .   Assessment & Plan:  Principal Problem:   Acute metabolic encephalopathy Active Problems:   History of closed left femoral fracture (HCC)   Acute cystitis   Essential hypertension   Hyperlipidemia   Reactive airway disease   Generalized anxiety disorder   History of dementia   CKD (chronic kidney disease), stage III (HCC)   Acute metabolic encephalopathy-UTI vs dementia progression vs delusion/hallucination/GAD disorder Delusion and hallucination Paranoid behavior History of generalized anxiety disorder -Patient does not have any focal deficit.  I suspect this is delirium induced by possible underlying urinary tract infection.  Seen by psychiatry.  Increase Zyprexa , restarted Lexapro, started gabapentin  and discontinued Remeron. - B12 ammonia and TSH are normal -Gentle hydration - MRI brain, CTA head and neck are unremarkable   Acute cystitis, E. coli - Cultures for E. coli are pansensitive, continue current antibiotics.  Currently on Ancef   Gram-positive bacteremia, Streptococcus mitis - Will order echocardiogram to rule out any endocarditis.  Will repeat blood cultures.    Hypokalemia - As needed repletion   Essential hypertension, uncontrolled - Hold Maxide as patient is getting gentle hydration.  IV as needed -Start Coreg twice daily   Hyperlipidemia -Continue Lipitor   Reactive airway disease Stable.  As needed bronchodilators   CKD stage IIIa -Creatinine 0.9 and GFR 58.  Renal function at baseline.   Continue to monitor   Left proximal fever intertrochanteric fracture secondary to fall -Status post IM nailing by Dr. Bernard Brick 08/15/2023.   PT/OT   DVT prophylaxis: Lovenox     Code Status: Full Code Family Communication: Family at bedside Status is: Inpatient Remains inpatient appropriate because: Continue hospital stay for at least 2 days    Subjective: Patient awake eating her breakfast.  Does not have any complaints at this time. Family present at bedside  Examination:  General exam: Appears calm and comfortable, elderly frail Respiratory system: Clear to auscultation. Respiratory effort normal. Cardiovascular system: S1 & S2 heard, RRR. No JVD, murmurs, rubs, gallops or clicks. No pedal edema. Gastrointestinal system: Abdomen is nondistended, soft and nontender. No organomegaly or masses felt. Normal bowel sounds heard. Central nervous system: Alert and oriented to name and place.  No obvious focal neurodeficits Extremities: Symmetric 5 x 5 power. Skin: No rashes, lesions or ulcers Psychiatry: Judgement and insight appear poor but follows all commands appropriately                Diet Orders (From admission, onward)     Start     Ordered   10/06/23 2251  Diet Heart Room service appropriate? Yes; Fluid consistency: Thin  Diet effective now       Question Answer Comment  Room service appropriate? Yes   Fluid consistency: Thin      10/06/23 2250            Objective: Vitals:   10/09/23 0908 10/09/23 0916 10/09/23 0932 10/09/23 1300  BP:   (!) 128/93 (!) 161/104  Pulse:   (!) 104 95  Resp:    18  Temp:    (!) 97.5  F (36.4 C)  TempSrc:    Oral  SpO2: 98% 98%  100%  Weight:      Height:        Intake/Output Summary (Last 24 hours) at 10/09/2023 1324 Last data filed at 10/09/2023 1053 Gross per 24 hour  Intake 713 ml  Output --  Net 713 ml   Filed Weights   10/06/23 2316  Weight: 64 kg    Scheduled Meds:  atorvastatin   20 mg Oral Daily    carvedilol  6.25 mg Oral BID WC   enoxaparin  (LOVENOX ) injection  40 mg Subcutaneous Q24H   escitalopram  10 mg Oral Daily   fluticasone   1 spray Each Nare Daily   fluticasone  furoate-vilanterol  1 puff Inhalation Daily   LORazepam  1 mg Intravenous Once   OLANZapine   7.5 mg Oral QHS   pantoprazole   40 mg Oral Daily   polyethylene glycol  17 g Oral BID   sodium chloride  flush  3 mL Intravenous Q12H   sodium chloride  flush  3 mL Intravenous Q12H   Continuous Infusions:   ceFAZolin  (ANCEF ) IV 2 g (10/09/23 0535)    Nutritional status     Body mass index is 22.1 kg/m.  Data Reviewed:   CBC: Recent Labs  Lab 10/06/23 2036 10/07/23 0423 10/08/23 0535 10/09/23 0453  WBC 6.9 5.8 5.4 6.5  NEUTROABS 4.2  --   --   --   HGB 11.3* 10.1* 10.1* 11.2*  HCT 37.2 34.0* 33.4* 37.7  MCV 92.1 94.2 92.3 93.5  PLT 384 311 297 320   Basic Metabolic Panel: Recent Labs  Lab 10/06/23 2036 10/07/23 0423 10/08/23 0535 10/09/23 0453  NA 139 139 142 141  K 4.1 3.4* 3.6 3.6  CL 104 106 110 107  CO2 26 25 25 23   GLUCOSE 108* 103* 101* 100*  BUN 25* 22 16 18   CREATININE 0.94 0.70 0.69 0.77  CALCIUM  9.6 8.9 8.9 9.4  MG  --   --  2.1 2.4  PHOS  --   --  3.3  --    GFR: Estimated Creatinine Clearance: 47.3 mL/min (by C-G formula based on SCr of 0.77 mg/dL). Liver Function Tests: Recent Labs  Lab 10/06/23 2036  AST 23  ALT 19  ALKPHOS 101  BILITOT 0.6  PROT 7.8  ALBUMIN 4.0   No results for input(s): "LIPASE", "AMYLASE" in the last 168 hours. Recent Labs  Lab 10/07/23 1016  AMMONIA 18   Coagulation Profile: No results for input(s): "INR", "PROTIME" in the last 168 hours. Cardiac Enzymes: No results for input(s): "CKTOTAL", "CKMB", "CKMBINDEX", "TROPONINI" in the last 168 hours. BNP (last 3 results) No results for input(s): "PROBNP" in the last 8760 hours. HbA1C: No results for input(s): "HGBA1C" in the last 72 hours. CBG: No results for input(s): "GLUCAP" in the last  168 hours. Lipid Profile: No results for input(s): "CHOL", "HDL", "LDLCALC", "TRIG", "CHOLHDL", "LDLDIRECT" in the last 72 hours. Thyroid  Function Tests: Recent Labs    10/07/23 1016  TSH 0.990   Anemia Panel: Recent Labs    10/07/23 1016 10/08/23 0535  VITAMINB12 574  --   FOLATE  --  6.7   Sepsis Labs: Recent Labs  Lab 10/06/23 2258  LATICACIDVEN 1.0    Recent Results (from the past 240 hours)  Urine Culture     Status: Abnormal   Collection Time: 10/06/23  9:18 PM   Specimen: Urine, Catheterized  Result Value Ref Range Status   Specimen Description  Final    URINE, CATHETERIZED Performed at [facility], [address], [place], [place] [address]    Special Requests   Final    NONE Performed at [facility], [address], [place], [place] [address]    Culture >=100,000 COLONIES/mL ESCHERICHIA COLI (A)  Final   Report Status 10/08/2023 FINAL  Final   Organism ID, Bacteria ESCHERICHIA COLI (A)  Final      Susceptibility   Escherichia coli - MIC*    AMPICILLIN 16 INTERMEDIATE Intermediate     CEFAZOLIN  <=4 SENSITIVE Sensitive     CEFEPIME <=0.12 SENSITIVE Sensitive     CEFTRIAXONE  <=0.25 SENSITIVE Sensitive     CIPROFLOXACIN  <=0.25 SENSITIVE Sensitive     GENTAMICIN <=1 SENSITIVE Sensitive     IMIPENEM <=0.25 SENSITIVE Sensitive     NITROFURANTOIN  <=16 SENSITIVE Sensitive     TRIMETH/SULFA >=320 RESISTANT Resistant     AMPICILLIN/SULBACTAM 8 SENSITIVE Sensitive     PIP/TAZO 8 SENSITIVE Sensitive ug/mL    * >=100,000 COLONIES/mL ESCHERICHIA COLI  Culture, blood (routine x 2)     Status: Abnormal (Preliminary result)   Collection Time: 10/06/23 10:49 PM   Specimen: BLOOD  Result Value Ref Range Status   Specimen Description   Final    BLOOD RIGHT ANTECUBITAL Performed at [facility], [address], [place], [place] [address]    Special Requests   Final    BOTTLES DRAWN AEROBIC AND ANAEROBIC  Blood Culture results may not be optimal due to an inadequate volume of blood received in culture bottles Performed at [facility], [address] [place], [place] [address]    Culture  Setup Time   Final    GRAM POSITIVE COCCI AEROBIC BOTTLE ONLY Organism ID to follow CRITICAL RESULT CALLED TO, READ BACK BY AND VERIFIED WITH: L POINDEXTER,[email or web address]@0040  10/08/23 MK    Culture (A)  Final    STREPTOCOCCUS MITIS/ORALIS SUSCEPTIBILITIES TO FOLLOW Performed at [facility], [address] [place], [place] [address]    Report Status PENDING  Incomplete  Culture, blood (routine x 2)     Status: None (Preliminary result)   Collection Time: 10/06/23 10:49 PM   Specimen: BLOOD LEFT HAND  Result Value Ref Range Status   Specimen Description   Final    BLOOD LEFT HAND Performed at [facility], [address], [place], [place] [address]    Special Requests   Final    BOTTLES DRAWN AEROBIC AND ANAEROBIC Blood Culture results may not be optimal due to an inadequate volume of blood received in culture bottles Performed at [facility], [address] [place], [place] [address]    Culture   Final    NO GROWTH 2 DAYS Performed at [facility], [address], [place], [place] [address]    Report Status PENDING  Incomplete  Blood Culture ID Panel (Reflexed)     Status: Abnormal   Collection Time: 10/06/23 10:49 PM  Result Value Ref Range Status   Enterococcus faecalis NOT DETECTED NOT DETECTED Final   Enterococcus Faecium NOT DETECTED NOT DETECTED Final   Listeria monocytogenes NOT DETECTED NOT DETECTED Final   Staphylococcus species NOT DETECTED NOT DETECTED Final   Staphylococcus aureus (BCID) NOT DETECTED NOT DETECTED Final   Staphylococcus epidermidis NOT DETECTED NOT DETECTED Final   Staphylococcus lugdunensis NOT DETECTED NOT DETECTED Final   Streptococcus species DETECTED (A) NOT DETECTED Final  Comment: Not Enterococcus  species, Streptococcus agalactiae, Streptococcus pyogenes, or Streptococcus pneumoniae. CRITICAL RESULT CALLED TO, READ BACK BY AND VERIFIED WITH: L POINDEXTER,[email or web address]@0040  10/08/23 MK    Streptococcus agalactiae NOT DETECTED NOT DETECTED Final   Streptococcus pneumoniae NOT DETECTED NOT DETECTED Final   Streptococcus pyogenes NOT DETECTED NOT DETECTED Final   A.calcoaceticus-baumannii NOT DETECTED NOT DETECTED Final   Bacteroides fragilis NOT DETECTED NOT DETECTED Final   Enterobacterales NOT DETECTED NOT DETECTED Final   Enterobacter cloacae complex NOT DETECTED NOT DETECTED Final   Escherichia coli NOT DETECTED NOT DETECTED Final   Klebsiella aerogenes NOT DETECTED NOT DETECTED Final   Klebsiella oxytoca NOT DETECTED NOT DETECTED Final   Klebsiella pneumoniae NOT DETECTED NOT DETECTED Final   Proteus species NOT DETECTED NOT DETECTED Final   Salmonella species NOT DETECTED NOT DETECTED Final   Serratia marcescens NOT DETECTED NOT DETECTED Final   Haemophilus influenzae NOT DETECTED NOT DETECTED Final   Neisseria meningitidis NOT DETECTED NOT DETECTED Final   Pseudomonas aeruginosa NOT DETECTED NOT DETECTED Final   Stenotrophomonas maltophilia NOT DETECTED NOT DETECTED Final   Candida albicans NOT DETECTED NOT DETECTED Final   Candida auris NOT DETECTED NOT DETECTED Final   Candida glabrata NOT DETECTED NOT DETECTED Final   Candida krusei NOT DETECTED NOT DETECTED Final   Candida parapsilosis NOT DETECTED NOT DETECTED Final   Candida tropicalis NOT DETECTED NOT DETECTED Final   Cryptococcus neoformans/gattii NOT DETECTED NOT DETECTED Final    Comment: Performed at [facility], [address], [place], [place] [address]         Radiology Studies: CT ANGIO HEAD NECK W WO CM Result Date: 10/08/2023 CLINICAL DATA:  Neuro deficit, acute, stroke suspected EXAM: CT ANGIOGRAPHY HEAD AND NECK WITH AND WITHOUT CONTRAST TECHNIQUE: Multidetector CT imaging of the head and neck was  performed using the standard protocol during bolus administration of intravenous contrast. Multiplanar CT image reconstructions and MIPs were obtained to evaluate the vascular anatomy. Carotid stenosis measurements (when applicable) are obtained utilizing NASCET criteria, using the distal internal carotid diameter as the denominator. RADIATION DOSE REDUCTION: This exam was performed according to the departmental dose-optimization program which includes automated exposure control, adjustment of the mA and/or kV according to patient size and/or use of iterative reconstruction technique. CONTRAST:  75mL OMNIPAQUE IOHEXOL 350 MG/ML SOLN COMPARISON:  Same day MRI head. FINDINGS: CT HEAD FINDINGS Brain: No evidence of acute infarction, hemorrhage, hydrocephalus, extra-axial collection or mass lesion/mass effect. Vascular: See below. Skull: No acute fracture. Sinuses/Orbits: Clear sinuses.  No acute orbital findings. Other: No mastoid effusions. Review of the MIP images confirms the above findings CTA NECK FINDINGS Aortic arch: Great vessel origins are patent without significant stenosis. Right carotid system: No evidence of dissection, stenosis (50% or greater), or occlusion. Left carotid system: No evidence of dissection, stenosis (50% or greater), or occlusion. Vertebral arteries: Left dominant. No evidence of dissection, stenosis (50% or greater), or occlusion. Skeleton: Multilevel degenerative change. No evidence of acute abnormality on limited assessment. Other neck: No acute abnormality on limited assessment. Upper chest: Visualized lung apices are clear. Review of the MIP images confirms the above findings CTA HEAD FINDINGS Anterior circulation: Bilateral intracranial ICAs, MCAs, and ACAs, are patent without proximal hemodynamically significant stenosis. Posterior circulation: Bilateral intradural vertebral arteries, basilar artery, and bilateral posterior cerebral arteries are patent without proximal  hemodynamically significant stenosis. Venous sinuses: As permitted by contrast timing, patent. Review of the MIP images confirms the above findings IMPRESSION:  No large vessel occlusion or proximal hemodynamically significant stenosis. Electronically Signed   By: Stevenson Elbe M.D.   On: 10/08/2023 12:04   MR BRAIN WO CONTRAST Result Date: 10/08/2023 CLINICAL DATA:  Transient ischemic attack (TIA) EXAM: MRI HEAD WITHOUT CONTRAST TECHNIQUE: Multiplanar, multiecho pulse sequences of the brain and surrounding structures were obtained without intravenous contrast. COMPARISON:  CT head March 12, 2023. FINDINGS: Brain: No acute infarction, hemorrhage, hydrocephalus, extra-axial collection or mass lesion. Vascular: Normal flow voids. Skull and upper cervical spine: Normal marrow signal. Sinuses/Orbits: Clear sinuses.  No acute orbital findings. Other: No mastoid effusions. IMPRESSION: No evidence of acute intracranial abnormality. Electronically Signed   By: Stevenson Elbe M.D.   On: 10/08/2023 11:31           LOS: 3 days   Time spent= 35 mins    Maggie Schooner, MD [facility]  If 7PM-7AM, please contact night-coverage  10/09/2023, 1:24 PM

## 2023-10-09 NOTE — Progress Notes (Signed)
 Daughter called and notified about restraints and aggressive behavior. Care plan continued

## 2023-10-09 NOTE — Progress Notes (Signed)
 Physical Therapy Treatment Patient Details Name: Jacqueline Foley MRN: [id number] DOB: Oct 22, 1932 Today's Date: 10/09/2023   History of Present Illness 89 y.o. female adm 5/8 with medical history significant of generalized anxiety disorder with depression, memory impairment/dementia, polio,left femoral intertrochanteric fracture s/p IM nailing repair 08/15/2023, CKD stage IIIa, hypertension, and hyperlipidemia presented to emergency department for mental status change include increased paranoia and delusion.  Just discharged from [facility], with planned [facility] at daughter's home.  Cleared for WBAT.    PT Comments  Pt is progressing toward acute PT goals with ambulation of ~51ft today and MIN A for stability and RW management, pt noted to be impulsive throughout session and required repeated cuing for sequencing- easily distracted.  Per pt's daughter she was performing transfers to/from w/c and [facility] in bathroom with assist. Daughter reports this is most mobility pt has done. Pt's daughter is concerned as pt's cognition has declined. Daughter and her spouse work, so there are times during the day when pt would be unsupervised/assisted if return home. Pt currently requires assist for all mobility. Pt's daughter to bring L AFO and pt's personal RW for height adjustment to hospital. Updating d/c rec- Patient will benefit from continued inpatient follow up therapy, <3 hours/day. Daughter plans to continue discussion for d/c resources with CM. If pt return home will need 24hr supervision and assist for all mobility.    If plan is discharge home, recommend the following: A little help with walking and/or transfers;Help with stairs or ramp for entrance;Supervision due to cognitive status;Assist for transportation;Direct supervision/assist for financial management;Direct supervision/assist for medications management;Assistance with cooking/housework;A little help with bathing/dressing/bathroom   Can travel by private  vehicle     Yes  Equipment Recommendations  None recommended by PT (pt has RW and w/c per daughter)    Recommendations for Other Services       Precautions / Restrictions Precautions Precautions: Fall Precaution/Restrictions Comments: has recently been fitted for AFO for Left foot, post polio Restrictions Weight Bearing Restrictions Per Provider Order: No     Mobility  Bed Mobility Overal bed mobility: Needs Assistance Bed Mobility: Sit to Supine     Supine to sit: Min assist     General bed mobility comments: pt on [facility] with staff present upon PT entry. assist for LE back into bed    Transfers Overall transfer level: Needs assistance Equipment used: Rolling walker (2 wheels) Transfers: Sit to/from Stand Sit to Stand: Min assist           General transfer comment: impulsive, MIN A cues for hand placement.    Ambulation/Gait Ambulation/Gait assistance: Min assist Gait Distance (Feet): 10 Feet Assistive device: Rolling walker (2 wheels) Gait Pattern/deviations: Step-to pattern, Decreased stride length, Narrow base of support, Trunk flexed, Shuffle Gait velocity: decreased     General Gait Details: forward flexion, increased time and assist with problem solving for avoiding obstacles with RW in tight spaces around room. Cues for close proximity to RW.   Stairs             Wheelchair Mobility     Tilt Bed    Modified Rankin (Stroke Patients Only)       Balance Overall balance assessment: Needs assistance Sitting-balance support: Feet supported Sitting balance-Leahy Scale: Good     Standing balance support: Reliant on assistive device for balance, During functional activity, Bilateral upper extremity supported Standing balance-Leahy Scale: Poor Standing balance comment: Narrow BOS, decreased safety with RW  Communication Communication Communication: No apparent difficulties  Cognition Arousal:  Alert Behavior During Therapy: WFL for tasks assessed/performed   PT - Cognitive impairments: History of cognitive impairments                         Following commands: Impaired Following commands impaired: Follows one step commands with increased time    Cueing Cueing Techniques: Verbal cues, Tactile cues, Visual cues  Exercises      General Comments        Pertinent Vitals/Pain Pain Assessment Pain Assessment: Faces Faces Pain Scale: Hurts a little bit Pain Location: L hip Pain Descriptors / Indicators: Sore Pain Intervention(s): Limited activity within patient's tolerance, Monitored during session, Repositioned    Home Living                          Prior Function            PT Goals (current goals can now be found in the care plan section) Acute Rehab PT Goals Patient Stated Goal: to  be able to walk alone PT Goal Formulation: With patient/family Time For Goal Achievement: 10/21/23 Potential to Achieve Goals: Fair Progress towards PT goals: Progressing toward goals    Frequency    Min 3X/week      PT Plan      Co-evaluation              AM-PAC PT "6 Clicks" Mobility   Outcome Measure  Help needed turning from your back to your side while in a flat bed without using bedrails?: A Little Help needed moving from lying on your back to sitting on the side of a flat bed without using bedrails?: A Little Help needed moving to and from a bed to a chair (including a wheelchair)?: A Little Help needed standing up from a chair using your arms (e.g., wheelchair or bedside chair)?: A Little Help needed to walk in hospital room?: Total Help needed climbing 3-5 steps with a railing? : A Lot 6 Click Score: 15    End of Session   Activity Tolerance: Patient tolerated treatment well Patient left: in bed;with call bell/phone within reach;with bed alarm set;with family/visitor present (in bed for echo) Nurse Communication: Mobility  status PT Visit Diagnosis: Unsteadiness on feet (R26.81);History of falling (Z91.81);Difficulty in walking, not elsewhere classified (R26.2)     Time: 1610-9604 PT Time Calculation (min) (ACUTE ONLY): 26 min  Charges:    $Therapeutic Activity: 23-37 mins PT General Charges $$ ACUTE PT VISIT: 1 Visit                    Faustine Hoof PT, DPT  Acute Rehabilitation Services  Office [phone]  10/09/2023, 4:07 PM

## 2023-10-09 NOTE — Progress Notes (Signed)
 Patient continuously jumping out of bed. While this RN was trying to get patient back in bed patient started swinging her fists and kicking legs at this RN.  Patient screams "Im not  going to bed I am leaving. You can just go ahead and get my clothes and I am leaving. And you better get out of my way before I hurt you." RN paged security. This RN tried to tell patient she was at the hospital and I was her nurse for the night. Patient states "I dont care what you say you better leave me the fuck alone before I call the cops. I am leaving here". This RN paged Sharion Davidson, NP. See orders. Care plan continued.

## 2023-10-09 NOTE — Plan of Care (Signed)
  Problem: Education: Goal: Knowledge of General Education information will improve Description: Including pain rating scale, medication(s)/side effects and non-pharmacologic comfort measures Outcome: Progressing   Problem: Clinical Measurements: Goal: Ability to maintain clinical measurements within normal limits will improve Outcome: Progressing Goal: Will remain free from infection Outcome: Progressing Goal: Diagnostic test results will improve Outcome: Progressing Goal: Respiratory complications will improve Outcome: Progressing Goal: Cardiovascular complication will be avoided Outcome: Progressing   Problem: Activity: Goal: Risk for activity intolerance will decrease Outcome: Progressing   Problem: Nutrition: Goal: Adequate nutrition will be maintained Outcome: Progressing   Problem: Pain Managment: Goal: General experience of comfort will improve and/or be controlled Outcome: Progressing   Problem: Safety: Goal: Ability to remain free from injury will improve Outcome: Progressing   Problem: Skin Integrity: Goal: Risk for impaired skin integrity will decrease Outcome: Progressing

## 2023-10-09 NOTE — Progress Notes (Signed)
  Echocardiogram 2D Echocardiogram has been performed.  Mahonri Seiden L Eleora Sutherland RDCS 10/09/2023, 3:59 PM

## 2023-10-09 NOTE — Plan of Care (Signed)

## 2023-10-10 DIAGNOSIS — G9341 Metabolic encephalopathy: Secondary | ICD-10-CM | POA: Diagnosis not present

## 2023-10-10 LAB — BASIC METABOLIC PANEL WITH GFR
Anion gap: 9 (ref 5–15)
BUN: 19 mg/dL (ref 8–23)
CO2: 25 mmol/L (ref 22–32)
Calcium: 9.1 mg/dL (ref 8.9–10.3)
Chloride: 105 mmol/L (ref 98–111)
Creatinine, Ser: 0.79 mg/dL (ref 0.44–1.00)
GFR, Estimated: >60 mL/min (ref >60)
Glucose, Bld: 111 mg/dL — ABNORMAL HIGH (ref 70–99)
Potassium: 3.3 mmol/L — ABNORMAL LOW (ref 3.5–5.1)
Sodium: 139 mmol/L (ref 135–145)

## 2023-10-10 LAB — CBC
HCT: 35.7 % — ABNORMAL LOW (ref 36.0–46.0)
Hemoglobin: 11.5 g/dL — ABNORMAL LOW (ref 12.0–15.0)
MCH: 29 pg (ref 26.0–34.0)
MCHC: 32.2 g/dL (ref 30.0–36.0)
MCV: 89.9 fL (ref 80.0–100.0)
Platelets: 353 10*3/uL (ref 150–400)
RBC: 3.97 MIL/uL (ref 3.87–5.11)
RDW: 14 % (ref 11.5–15.5)
WBC: 7.2 10*3/uL (ref 4.0–10.5)
nRBC: 0 % (ref 0.0–0.2)

## 2023-10-10 LAB — MAGNESIUM: Magnesium: 2.3 mg/dL (ref 1.7–2.4)

## 2023-10-10 MED ORDER — OLANZAPINE 5 MG PO TABS
5.0000 mg | ORAL_TABLET | Freq: Four times a day (QID) | ORAL | Status: AC | PRN
Start: 2023-10-10 — End: 2023-10-13
  Administered 2023-10-10: 5 mg via ORAL
  Filled 2023-10-10: qty 1

## 2023-10-10 MED ORDER — POTASSIUM CHLORIDE 20 MEQ PO PACK
40.0000 meq | PACK | Freq: Once | ORAL | Status: AC
  Administered 2023-10-10: 40 meq via ORAL
  Filled 2023-10-10: qty 2

## 2023-10-10 NOTE — Plan of Care (Signed)

## 2023-10-10 NOTE — Progress Notes (Signed)
 PROGRESS NOTE    Jacqueline Foley  WUJ:[id number] DOB: April 19, 1933 DOA: 10/06/2023 PCP: [name], DO    Brief Narrative:  89 year old with history of GAD, depression, dementia/memory impairment, left hip fracture status post IM nailing 08/15/2023, CKD 3A, HTN, HLD comes to the ED for evaluation of altered mental status/paranoia and delusion.  Upon admission basic metabolic workup was overall unremarkable besides urinary tract infection.  Started on IV Rocephin .   Assessment & Plan:  Principal Problem:   Acute metabolic encephalopathy Active Problems:   History of closed left femoral fracture (HCC)   Acute cystitis   Essential hypertension   Hyperlipidemia   Reactive airway disease   Generalized anxiety disorder   History of dementia   CKD (chronic kidney disease), stage III (HCC)   Acute metabolic encephalopathy-UTI vs dementia progression vs delusion/hallucination/GAD disorder Delusion and hallucination Paranoid behavior History of generalized anxiety disorder -Patient does not have any focal deficit.  I suspect this is delirium induced by possible underlying urinary tract infection.  Seen by psychiatry.  Increase Zyprexa , restarted Lexapro, started gabapentin  and discontinued Remeron. - B12 ammonia and TSH are normal -Gentle hydration - MRI brain, CTA head and neck are unremarkable   Acute cystitis, E. coli - Cultures for E. coli are pansensitive, continue current antibiotics.  Currently on Ancef   Gram-positive bacteremia, Streptococcus mitis - Echocardiogram negative for endocarditis.  Blood cultures remain negative  Hypokalemia - As needed repletion   Essential hypertension, uncontrolled - Hold Maxide as patient is getting gentle hydration.  IV as needed -Start Coreg twice daily   Hyperlipidemia -Continue Lipitor   Reactive airway disease Stable.  As needed bronchodilators   CKD stage IIIa -Creatinine 0.9 and GFR 58.  Renal function at baseline.  Continue to  monitor   Left proximal fever intertrochanteric fracture secondary to fall -Status post IM nailing by Dr. Bernard Brick 08/15/2023.   PT/OT   DVT prophylaxis: Lovenox     Code Status: Full Code Family Communication: Family at bedside Status is: Inpatient Remains inpatient appropriate because: Continue hospital stay for at least 2 days    Subjective: Overnight had a lot of agitation This morning slightly confused but calm Met with daughter at bedside  Examination:  General exam: Appears calm and comfortable, elderly frail Respiratory system: Clear to auscultation. Respiratory effort normal. Cardiovascular system: S1 & S2 heard, RRR. No JVD, murmurs, rubs, gallops or clicks. No pedal edema. Gastrointestinal system: Abdomen is nondistended, soft and nontender. No organomegaly or masses felt. Normal bowel sounds heard. Central nervous system: Alert and oriented to name and place.  No obvious focal neurodeficits Extremities: Symmetric 5 x 5 power. Skin: No rashes, lesions or ulcers Psychiatry: Judgement and insight appear poor                Diet Orders (From admission, onward)     Start     Ordered   10/06/23 2251  Diet Heart Room service appropriate? Yes; Fluid consistency: Thin  Diet effective now       Question Answer Comment  Room service appropriate? Yes   Fluid consistency: Thin      10/06/23 2250            Objective: Vitals:   10/09/23 2053 10/10/23 0446 10/10/23 0909 10/10/23 0943  BP: (!) 170/92 (!) 156/99 124/83   Pulse: 95 (!) 107 (!) 110   Resp: 19 19    Temp: 97.8 F (36.6 C) 98.4 F (36.9 C)    TempSrc: Oral Oral  SpO2: 99% 100%  97%  Weight:      Height:        Intake/Output Summary (Last 24 hours) at 10/10/2023 1329 Last data filed at 10/10/2023 0911 Gross per 24 hour  Intake 243 ml  Output 1000 ml  Net -757 ml   Filed Weights   10/06/23 2316  Weight: 64 kg    Scheduled Meds:  atorvastatin   20 mg Oral Daily   carvedilol  6.25  mg Oral BID WC   enoxaparin  (LOVENOX ) injection  40 mg Subcutaneous Q24H   escitalopram  10 mg Oral Daily   fluticasone   1 spray Each Nare Daily   fluticasone  furoate-vilanterol  1 puff Inhalation Daily   haloperidol  lactate  2 mg Intravenous Once   LORazepam  1 mg Intravenous Once   OLANZapine   7.5 mg Oral QHS   pantoprazole   40 mg Oral Daily   polyethylene glycol  17 g Oral BID   sodium chloride  flush  3 mL Intravenous Q12H   sodium chloride  flush  3 mL Intravenous Q12H   Continuous Infusions:   ceFAZolin  (ANCEF ) IV 2 g (10/10/23 1323)    Nutritional status     Body mass index is 22.1 kg/m.  Data Reviewed:   CBC: Recent Labs  Lab 10/06/23 2036 10/07/23 0423 10/08/23 0535 10/09/23 0453 10/10/23 0747  WBC 6.9 5.8 5.4 6.5 7.2  NEUTROABS 4.2  --   --   --   --   HGB 11.3* 10.1* 10.1* 11.2* 11.5*  HCT 37.2 34.0* 33.4* 37.7 35.7*  MCV 92.1 94.2 92.3 93.5 89.9  PLT 384 311 297 320 353   Basic Metabolic Panel: Recent Labs  Lab 10/06/23 2036 10/07/23 0423 10/08/23 0535 10/09/23 0453 10/10/23 0747  NA 139 139 142 141 139  K 4.1 3.4* 3.6 3.6 3.3*  CL 104 106 110 107 105  CO2 26 25 25 23 25   GLUCOSE 108* 103* 101* 100* 111*  BUN 25* 22 16 18 19   CREATININE 0.94 0.70 0.69 0.77 0.79  CALCIUM  9.6 8.9 8.9 9.4 9.1  MG  --   --  2.1 2.4 2.3  PHOS  --   --  3.3  --   --    GFR: Estimated Creatinine Clearance: 47.3 mL/min (by C-G formula based on SCr of 0.79 mg/dL). Liver Function Tests: Recent Labs  Lab 10/06/23 2036  AST 23  ALT 19  ALKPHOS 101  BILITOT 0.6  PROT 7.8  ALBUMIN 4.0   No results for input(s): "LIPASE", "AMYLASE" in the last 168 hours. Recent Labs  Lab 10/07/23 1016  AMMONIA 18   Coagulation Profile: No results for input(s): "INR", "PROTIME" in the last 168 hours. Cardiac Enzymes: No results for input(s): "CKTOTAL", "CKMB", "CKMBINDEX", "TROPONINI" in the last 168 hours. BNP (last 3 results) No results for input(s): "PROBNP" in the last  8760 hours. HbA1C: No results for input(s): "HGBA1C" in the last 72 hours. CBG: No results for input(s): "GLUCAP" in the last 168 hours. Lipid Profile: No results for input(s): "CHOL", "HDL", "LDLCALC", "TRIG", "CHOLHDL", "LDLDIRECT" in the last 72 hours. Thyroid  Function Tests: No results for input(s): "TSH", "T4TOTAL", "FREET4", "T3FREE", "THYROIDAB" in the last 72 hours. Anemia Panel: Recent Labs    10/08/23 0535  FOLATE 6.7   Sepsis Labs: Recent Labs  Lab 10/06/23 2258  LATICACIDVEN 1.0    Recent Results (from the past 240 hours)  Urine Culture     Status: Abnormal   Collection Time: 10/06/23  9:18 PM  Specimen: Urine, Catheterized  Result Value Ref Range Status   Specimen Description   Final    URINE, CATHETERIZED Performed at [facility], [address] [place], [place] [address]    Special Requests   Final    NONE Performed at [facility], [address], [place], [place] [address]    Culture >=100,000 COLONIES/mL ESCHERICHIA COLI (A)  Final   Report Status 10/08/2023 FINAL  Final   Organism ID, Bacteria ESCHERICHIA COLI (A)  Final      Susceptibility   Escherichia coli - MIC*    AMPICILLIN 16 INTERMEDIATE Intermediate     CEFAZOLIN  <=4 SENSITIVE Sensitive     CEFEPIME <=0.12 SENSITIVE Sensitive     CEFTRIAXONE  <=0.25 SENSITIVE Sensitive     CIPROFLOXACIN  <=0.25 SENSITIVE Sensitive     GENTAMICIN <=1 SENSITIVE Sensitive     IMIPENEM <=0.25 SENSITIVE Sensitive     NITROFURANTOIN  <=16 SENSITIVE Sensitive     TRIMETH/SULFA >=320 RESISTANT Resistant     AMPICILLIN/SULBACTAM 8 SENSITIVE Sensitive     PIP/TAZO 8 SENSITIVE Sensitive ug/mL    * >=100,000 COLONIES/mL ESCHERICHIA COLI  Culture, blood (routine x 2)     Status: Abnormal   Collection Time: 10/06/23 10:49 PM   Specimen: BLOOD  Result Value Ref Range Status   Specimen Description   Final    BLOOD RIGHT ANTECUBITAL Performed at [facility],  [address] [place], [place] [address]    Special Requests   Final    BOTTLES DRAWN AEROBIC AND ANAEROBIC Blood Culture results may not be optimal due to an inadequate volume of blood received in culture bottles Performed at [facility], [address] [place], [place] [address]    Culture  Setup Time   Final    GRAM POSITIVE COCCI AEROBIC BOTTLE ONLY Organism ID to follow CRITICAL RESULT CALLED TO, READ BACK BY AND VERIFIED WITH: L POINDEXTER,[email or web address]  10/08/23 MK Performed at [facility], [address] [place], [place] [address]    Culture STREPTOCOCCUS MITIS/ORALIS (A)  Final   Report Status 10/09/2023 FINAL  Final   Organism ID, Bacteria STREPTOCOCCUS MITIS/ORALIS  Final      Susceptibility   Streptococcus mitis/oralis - MIC*    PENICILLIN <=0.06 SENSITIVE Sensitive     CEFTRIAXONE  <=0.12 SENSITIVE Sensitive     LEVOFLOXACIN 1 SENSITIVE Sensitive     VANCOMYCIN 0.5 SENSITIVE Sensitive     * STREPTOCOCCUS MITIS/ORALIS  Culture, blood (routine x 2)     Status: None (Preliminary result)   Collection Time: 10/06/23 10:49 PM   Specimen: BLOOD LEFT HAND  Result Value Ref Range Status   Specimen Description   Final    BLOOD LEFT HAND Performed at [facility], [address], [place], [place] [address]    Special Requests   Final    BOTTLES DRAWN AEROBIC AND ANAEROBIC Blood Culture results may not be optimal due to an inadequate volume of blood received in culture bottles Performed at [facility], [address] [place], [place] [address]    Culture   Final    NO GROWTH 3 DAYS Performed at [facility], [address], [place], [place] [address]    Report Status PENDING  Incomplete  Blood Culture ID Panel (Reflexed)     Status: Abnormal   Collection Time: 10/06/23 10:49 PM  Result Value Ref Range Status   Enterococcus faecalis NOT DETECTED NOT DETECTED Final  Enterococcus Faecium NOT DETECTED NOT DETECTED  Final   Listeria monocytogenes NOT DETECTED NOT DETECTED Final   Staphylococcus species NOT DETECTED NOT DETECTED Final   Staphylococcus aureus (BCID) NOT DETECTED NOT DETECTED Final   Staphylococcus epidermidis NOT DETECTED NOT DETECTED Final   Staphylococcus lugdunensis NOT DETECTED NOT DETECTED Final   Streptococcus species DETECTED (A) NOT DETECTED Final    Comment: Not Enterococcus species, Streptococcus agalactiae, Streptococcus pyogenes, or Streptococcus pneumoniae. CRITICAL RESULT CALLED TO, READ BACK BY AND VERIFIED WITH: L POINDEXTER,[email or web address]  10/08/23 MK    Streptococcus agalactiae NOT DETECTED NOT DETECTED Final   Streptococcus pneumoniae NOT DETECTED NOT DETECTED Final   Streptococcus pyogenes NOT DETECTED NOT DETECTED Final   A.calcoaceticus-baumannii NOT DETECTED NOT DETECTED Final   Bacteroides fragilis NOT DETECTED NOT DETECTED Final   Enterobacterales NOT DETECTED NOT DETECTED Final   Enterobacter cloacae complex NOT DETECTED NOT DETECTED Final   Escherichia coli NOT DETECTED NOT DETECTED Final   Klebsiella aerogenes NOT DETECTED NOT DETECTED Final   Klebsiella oxytoca NOT DETECTED NOT DETECTED Final   Klebsiella pneumoniae NOT DETECTED NOT DETECTED Final   Proteus species NOT DETECTED NOT DETECTED Final   Salmonella species NOT DETECTED NOT DETECTED Final   Serratia marcescens NOT DETECTED NOT DETECTED Final   Haemophilus influenzae NOT DETECTED NOT DETECTED Final   Neisseria meningitidis NOT DETECTED NOT DETECTED Final   Pseudomonas aeruginosa NOT DETECTED NOT DETECTED Final   Stenotrophomonas maltophilia NOT DETECTED NOT DETECTED Final   Candida albicans NOT DETECTED NOT DETECTED Final   Candida auris NOT DETECTED NOT DETECTED Final   Candida glabrata NOT DETECTED NOT DETECTED Final   Candida krusei NOT DETECTED NOT DETECTED Final   Candida parapsilosis NOT DETECTED NOT DETECTED Final   Candida tropicalis NOT DETECTED NOT DETECTED Final   Cryptococcus  neoformans/gattii NOT DETECTED NOT DETECTED Final    Comment: Performed at [facility], [address] [place], [place] [address]         Radiology Studies: ECHOCARDIOGRAM COMPLETE Result Date: 10/09/2023    ECHOCARDIOGRAM REPORT   Patient Name:   Jacqueline Foley Date of Exam: 10/09/2023 Medical Rec #:  [id number]        Height:       67.0 in Accession #:    [id number]       Weight:       141.1 lb Date of Birth:  1933/01/18        BSA:          1.744 m Patient Age:    89 years         BP:           128/93 mmHg Patient Gender: F                HR:           98 bpm. Exam Location:  Inpatient Procedure: 2D Echo, Color Doppler and Cardiac Doppler (Both Spectral and Color            Flow Doppler were utilized during procedure). Indications:    Bacteremia  History:        Patient has prior history of Echocardiogram examinations, most                 recent 07/08/2023. TIA, Signs/Symptoms:Chest Pain; Risk                 Factors:Diabetes, Hypertension and Dyslipidemia. CKD stage 3.  Sonographer:    Juanita Shaw Referring Phys:  [id number] Kaden Dunkel C Hieu Herms IMPRESSIONS  1. Left ventricular ejection fraction, by estimation, is 60 to 65%. The left ventricle has normal function. The left ventricle has no regional wall motion abnormalities. There is moderate asymmetric left ventricular hypertrophy of the basal-septal segment. Indeterminate diastolic filling due to E-A fusion.  2. Right ventricular systolic function is normal. The right ventricular size is normal.  3. The mitral valve is normal in structure. Mild mitral valve regurgitation. No evidence of mitral stenosis.  4. The aortic valve is calcified. Aortic valve regurgitation is not visualized. Aortic valve sclerosis is present, with no evidence of aortic valve stenosis.  5. The inferior vena cava is normal in size with greater than 50% respiratory variability, suggesting right atrial pressure of 3 mmHg. FINDINGS  Left Ventricle: Left ventricular ejection fraction,  by estimation, is 60 to 65%. The left ventricle has normal function. The left ventricle has no regional wall motion abnormalities. Strain was performed and the global longitudinal strain is indeterminate. The left ventricular internal cavity size was normal in size. There is moderate asymmetric left ventricular hypertrophy of the basal-septal segment. Indeterminate diastolic filling due to E-A fusion. Right Ventricle: The right ventricular size is normal. No increase in right ventricular wall thickness. Right ventricular systolic function is normal. Left Atrium: Left atrial size was normal in size. Right Atrium: Right atrial size was normal in size. Pericardium: There is no evidence of pericardial effusion. Mitral Valve: The mitral valve is normal in structure. Mild mitral valve regurgitation. No evidence of mitral valve stenosis. MV peak gradient, 4.7 mmHg. The mean mitral valve gradient is 2.0 mmHg. Tricuspid Valve: The tricuspid valve is normal in structure. Tricuspid valve regurgitation is not demonstrated. No evidence of tricuspid stenosis. Aortic Valve: The aortic valve is calcified. There is mild to moderate aortic valve annular calcification. Aortic valve regurgitation is not visualized. Aortic valve sclerosis is present, with no evidence of aortic valve stenosis. Aortic valve mean gradient measures 3.0 mmHg. Aortic valve peak gradient measures 6.2 mmHg. Aortic valve area, by VTI measures 1.28 cm. Pulmonic Valve: The pulmonic valve was normal in structure. Pulmonic valve regurgitation is not visualized. No evidence of pulmonic stenosis. Aorta: The aortic root and ascending aorta are structurally normal, with no evidence of dilitation. Venous: The inferior vena cava is normal in size with greater than 50% respiratory variability, suggesting right atrial pressure of 3 mmHg. IAS/Shunts: The atrial septum is grossly normal. Additional Comments: 3D was performed not requiring image post processing on an  independent workstation and was indeterminate.  LEFT VENTRICLE PLAX 2D LVIDd:         3.10 cm     Diastology LVIDs:         2.20 cm     LV e' medial:    5.33 cm/s LV PW:         0.80 cm     LV E/e' medial:  11.8 LV IVS:        0.90 cm     LV e' lateral:   8.38 cm/s LVOT diam:     1.70 cm     LV E/e' lateral: 7.5 LV SV:         25 LV SV Index:   14 LVOT Area:     2.27 cm  LV Volumes (MOD) LV vol d, MOD A2C: 70.3 ml LV vol d, MOD A4C: 91.6 ml LV vol s, MOD A2C: 32.3 ml LV vol s, MOD A4C: 28.5 ml LV SV MOD A2C:  38.0 ml LV SV MOD A4C:     91.6 ml LV SV MOD BP:      49.1 ml RIGHT VENTRICLE             IVC RV Basal diam:  3.10 cm     IVC diam: 1.20 cm RV Mid diam:    1.90 cm RV S prime:     10.60 cm/s TAPSE (M-mode): 1.7 cm LEFT ATRIUM             Index        RIGHT ATRIUM          Index LA diam:        2.60 cm 1.49 cm/m   RA Area:     7.56 cm LA Vol (A2C):   15.8 ml 9.06 ml/m   RA Volume:   12.90 ml 7.40 ml/m LA Vol (A4C):   24.4 ml 13.99 ml/m LA Biplane Vol: 19.8 ml 11.36 ml/m  AORTIC VALVE                    PULMONIC VALVE AV Area (Vmax):    1.60 cm     PV Vmax:       0.95 m/s AV Area (Vmean):   1.35 cm     PV Peak grad:  3.6 mmHg AV Area (VTI):     1.28 cm AV Vmax:           124.00 cm/s AV Vmean:          82.200 cm/s AV VTI:            0.191 m AV Peak Grad:      6.2 mmHg AV Mean Grad:      3.0 mmHg LVOT Vmax:         87.30 cm/s LVOT Vmean:        48.800 cm/s LVOT VTI:          0.108 m LVOT/AV VTI ratio: 0.57  AORTA Ao Root diam: 2.70 cm Ao Asc diam:  2.40 cm MITRAL VALVE MV Area (PHT): 4.57 cm     SHUNTS MV Area VTI:   1.67 cm     Systemic VTI:  0.11 m MV Peak grad:  4.7 mmHg     Systemic Diam: 1.70 cm MV Mean grad:  2.0 mmHg MV Vmax:       1.08 m/s MV Vmean:      63.8 cm/s MV Decel Time: 166 msec MV E velocity: 62.80 cm/s MV A velocity: 104.00 cm/s MV E/A ratio:  0.60 Gloriann Larger MD Electronically signed by Gloriann Larger MD Signature Date/Time: 10/09/2023/4:45:23 PM    Final             LOS: 4 days   Time spent= 35 mins    Maggie Schooner, MD [facility]  If 7PM-7AM, please contact night-coverage  10/10/2023, 1:29 PM

## 2023-10-11 DIAGNOSIS — G9341 Metabolic encephalopathy: Secondary | ICD-10-CM | POA: Diagnosis not present

## 2023-10-11 LAB — CBC
HCT: 31.6 % — ABNORMAL LOW (ref 36.0–46.0)
Hemoglobin: 9.6 g/dL — ABNORMAL LOW (ref 12.0–15.0)
MCH: 28.3 pg (ref 26.0–34.0)
MCHC: 30.4 g/dL (ref 30.0–36.0)
MCV: 93.2 fL (ref 80.0–100.0)
Platelets: 270 10*3/uL (ref 150–400)
RBC: 3.39 MIL/uL — ABNORMAL LOW (ref 3.87–5.11)
RDW: 14.4 % (ref 11.5–15.5)
WBC: 6 10*3/uL (ref 4.0–10.5)
nRBC: 0 % (ref 0.0–0.2)

## 2023-10-11 LAB — BASIC METABOLIC PANEL WITH GFR
Anion gap: 6 (ref 5–15)
BUN: 28 mg/dL — ABNORMAL HIGH (ref 8–23)
CO2: 26 mmol/L (ref 22–32)
Calcium: 8.7 mg/dL — ABNORMAL LOW (ref 8.9–10.3)
Chloride: 107 mmol/L (ref 98–111)
Creatinine, Ser: 0.87 mg/dL (ref 0.44–1.00)
GFR, Estimated: >60 mL/min (ref >60)
Glucose, Bld: 103 mg/dL — ABNORMAL HIGH (ref 70–99)
Potassium: 3.8 mmol/L (ref 3.5–5.1)
Sodium: 139 mmol/L (ref 135–145)

## 2023-10-11 LAB — MAGNESIUM: Magnesium: 2.3 mg/dL (ref 1.7–2.4)

## 2023-10-11 NOTE — NC FL2 (Signed)
 [place]  MEDICAID FL2 LEVEL OF CARE FORM     IDENTIFICATION  Patient Name: Jacqueline Foley Birthdate: January 10, 1933 Sex: female Admission Date (Current Location): 10/06/2023  [facility] and [place] Number:  Producer, television/film/video and Address:  [facility],  [address], [place] [address]      Provider Number: [id number]  Attending Physician Name and Address:  Maggie Schooner, MD  Relative Name and Phone Number:  Neal,Cheri (Daughter)  [phone] (Mobile    Current Level of Care: SNF Recommended Level of Care: Skilled Nursing Facility, Memory Care Prior Approval Number:    Date Approved/Denied:   PASRR Number:    Discharge Plan: SNF    Current Diagnoses: Patient Active Problem List   Diagnosis Date Noted   Major depressive disorder, recurrent episode, moderate (HCC) 10/07/2023   Acute cystitis 10/06/2023   Generalized anxiety disorder 10/06/2023   History of dementia 10/06/2023   CKD (chronic kidney disease), stage III (HCC) 10/06/2023   Acute metabolic encephalopathy 10/06/2023   History of closed left femoral fracture (HCC) 08/13/2023   Fall at home, initial encounter 08/13/2023   Acute kidney injury superimposed on CKD (HCC) 08/13/2023   TIA (transient ischemic attack) 08/13/2023   GERD (gastroesophageal reflux disease) 08/13/2023   Hyperlipidemia 08/13/2023   Dementia without behavioral disturbance (HCC) 08/13/2023   Reactive airway disease 08/13/2023   Femur fracture, left (HCC) 08/13/2023   Aortic atherosclerosis (HCC) 03/18/2020   History of chest pain 03/19/2019   Chest pain 11/08/2017   Lumbar radiculopathy 05/02/2017   Precordial chest pain 12/06/2013   Chest pain at rest, negative MI, negative stress test, may have been GI 12/06/2013   Essential hypertension 12/06/2013   DOE (dyspnea on exertion), may be due to HTN 12/06/2013    Orientation RESPIRATION BLADDER Height & Weight     Self, Time, Place  Normal External catheter Weight: 64  kg Height:  5\' 7"  (170.2 cm)  BEHAVIORAL SYMPTOMS/MOOD NEUROLOGICAL BOWEL NUTRITION STATUS      Continent Diet  AMBULATORY STATUS COMMUNICATION OF NEEDS Skin   Limited Assist Verbally Normal                       Personal Care Assistance Level of Assistance              Functional Limitations Info             SPECIAL CARE FACTORS FREQUENCY  PT (By licensed PT), OT (By licensed OT)     PT Frequency: 5x weekly OT Frequency: 5x weekly            Contractures Contractures Info: Not present    Additional Factors Info  Allergies, Psychotropic   Allergies Info: tomoato, protonix  Psychotropic Info: zoloft          Current Medications (10/11/2023):  This is the current hospital active medication list Current Facility-Administered Medications  Medication Dose Route Frequency Provider Last Rate Last Admin   acetaminophen  (TYLENOL ) tablet 650 mg  650 mg Oral Q6H PRN Sundil, Subrina, MD   650 mg at 10/09/23 5638   Or   acetaminophen  (TYLENOL ) suppository 650 mg  650 mg Rectal Q6H PRN Sundil, Subrina, MD       acetaminophen  (TYLENOL ) tablet 325 mg  325 mg Oral BID PRN Amin, Ankit C, MD       atorvastatin  (LIPITOR) tablet 20 mg  20 mg Oral Daily Sundil, Subrina, MD   20 mg at 10/10/23 0909   carvedilol (COREG)  tablet 6.25 mg  6.25 mg Oral BID WC Amin, Ankit C, MD   6.25 mg at 10/10/23 1803   ceFAZolin  (ANCEF ) IVPB 2g/100 mL premix  2 g Intravenous Q8H Amin, Ankit C, MD 200 mL/hr at 10/11/23 0559 2 g at 10/11/23 0559   dextromethorphan-guaiFENesin (MUCINEX DM) 30-600 MG per 12 hr tablet 1 tablet  1 tablet Oral BID PRN Amin, Ankit C, MD   1 tablet at 10/10/23 1322   enoxaparin  (LOVENOX ) injection 40 mg  40 mg Subcutaneous Q24H Ellington, Abby K, RPH   40 mg at 10/10/23 0909   escitalopram (LEXAPRO) tablet 10 mg  10 mg Oral Daily Lord, Jamison Y, NP   10 mg at 10/10/23 5409   fluticasone  (FLONASE ) 50 MCG/ACT nasal spray 1 spray  1 spray Each Nare Daily Sundil, Subrina, MD   1  spray at 10/10/23 0910   fluticasone  furoate-vilanterol (BREO ELLIPTA ) 200-25 MCG/ACT 1 puff  1 puff Inhalation Daily Sundil, Subrina, MD   1 puff at 10/11/23 8119   gabapentin  (NEURONTIN ) capsule 100 mg  100 mg Oral TID PRN Lord, Jamison Y, NP       glucagon (human recombinant) (GLUCAGEN) injection 1 mg  1 mg Intravenous PRN Amin, Ankit C, MD       haloperidol  lactate (HALDOL ) injection 1 mg  1 mg Intravenous Q6H PRN Sundil, Subrina, MD   1 mg at 10/09/23 2313   haloperidol  lactate (HALDOL ) injection 2 mg  2 mg Intravenous Once Daniels, James K, NP       hydrALAZINE  (APRESOLINE ) injection 10 mg  10 mg Intravenous Q4H PRN Amin, Ankit C, MD   10 mg at 10/09/23 0358   ipratropium-albuterol  (DUONEB) 0.5-2.5 (3) MG/3ML nebulizer solution 3 mL  3 mL Nebulization Q4H PRN Amin, Ankit C, MD       LORazepam (ATIVAN) injection 1 mg  1 mg Intravenous Once Amin, Ankit C, MD       methocarbamol  (ROBAXIN ) tablet 500 mg  500 mg Oral Q6H PRN Sundil, Subrina, MD   500 mg at 10/10/23 1324   metoprolol  tartrate (LOPRESSOR ) injection 5 mg  5 mg Intravenous Q4H PRN Amin, Ankit C, MD   5 mg at 10/08/23 1736   OLANZapine  (ZYPREXA ) tablet 5 mg  5 mg Oral Q6H PRN Amin, Ankit C, MD   5 mg at 10/10/23 1478   OLANZapine  (ZYPREXA ) tablet 7.5 mg  7.5 mg Oral QHS Lissa Riding, NP   7.5 mg at 10/09/23 2114   ondansetron  (ZOFRAN ) tablet 4 mg  4 mg Oral Q6H PRN Sundil, Subrina, MD       Or   ondansetron  (ZOFRAN ) injection 4 mg  4 mg Intravenous Q6H PRN Sundil, Subrina, MD       pantoprazole  (PROTONIX ) EC tablet 40 mg  40 mg Oral Daily Amin, Ankit C, MD   40 mg at 10/10/23 0909   polyethylene glycol (MIRALAX  / GLYCOLAX ) packet 17 g  17 g Oral BID Sundil, Subrina, MD   17 g at 10/07/23 2216   senna-docusate (Senokot-S) tablet 1 tablet  1 tablet Oral QHS PRN Amin, Ankit C, MD       sodium chloride  flush (NS) 0.9 % injection 3 mL  3 mL Intravenous Q12H Sundil, Subrina, MD   3 mL at 10/10/23 0911   sodium chloride  flush (NS) 0.9 %  injection 3 mL  3 mL Intravenous Q12H Sundil, Subrina, MD   3 mL at 10/10/23 2130   sodium chloride  flush (NS) 0.9 %  injection 3 mL  3 mL Intravenous PRN Sundil, Subrina, MD         Discharge Medications: Please see discharge summary for a list of discharge medications.  Relevant Imaging Results:  Relevant Lab Results:   Additional Information SS# [id number]  Kathryn Parish, RN

## 2023-10-11 NOTE — Progress Notes (Signed)
 PROGRESS NOTE    Jacqueline Foley  ZOX:[id number] DOB: [birth year] DOA: 10/06/2023 PCP: [name], DO    Brief Narrative:  89 year old with history of GAD, depression, dementia/memory impairment, left hip fracture status post IM nailing 08/15/2023, CKD 3A, HTN, HLD comes to the ED for evaluation of altered mental status/paranoia and delusion.  Upon admission basic metabolic workup was overall unremarkable besides urinary tract infection.  Started on IV Rocephin .  Blood cultures grew Streptococcus mitis and this was hard to distinguish if this was a contaminant.  Reviewed with ID.  Echocardiogram is negative, surveillance cultures are pending.  No further workup warranted at this time.   Assessment & Plan:  Principal Problem:   Acute metabolic encephalopathy Active Problems:   History of closed left femoral fracture (HCC)   Acute cystitis   Essential hypertension   Hyperlipidemia   Reactive airway disease   Generalized anxiety disorder   History of dementia   CKD (chronic kidney disease), stage III (HCC)   Acute metabolic encephalopathy-UTI vs dementia progression vs delusion/hallucination/GAD disorder Delusion and hallucination Paranoid behavior History of generalized anxiety disorder -Patient does not have any focal deficit.  I suspect this is delirium induced by possible underlying urinary tract infection.  Seen by psychiatry.  Increase Zyprexa , restarted Lexapro, started gabapentin  and discontinued Remeron.  As needed p.o. Zyprexa  - B12 ammonia and TSH are normal -Gentle hydration - MRI brain, CTA head and neck are unremarkable   Acute cystitis, E. coli - Cultures for E. coli are pansensitive, continue current antibiotics.  Currently on Ancef   Gram-positive bacteremia, Streptococcus mitis - Echocardiogram negative for endocarditis.  Repeat blood cultures, pending.  Hypokalemia - As needed repletion   Essential hypertension, uncontrolled - Hold Maxide as patient is  getting gentle hydration.  IV as needed -Start Coreg twice daily   Hyperlipidemia -Continue Lipitor   Reactive airway disease Stable.  As needed bronchodilators   CKD stage IIIa -Creatinine 0.9 and GFR 58.  Renal function at baseline.  Continue to monitor   Left proximal fever intertrochanteric fracture secondary to fall -Status post IM nailing by Dr. Bernard Brick 08/15/2023.   PT/OT   DVT prophylaxis: Lovenox     Code Status: Full Code Family Communication: Family at bedside Status is: Inpatient Remains inpatient appropriate because: Later today should be 48 hours of negative blood cultures.  Alerted TOC for potential discharge tomorrow.  Family is aware   Subjective: Seen at bedside resting comfortably Daughter is also present at bedside  Examination:  General exam: Appears calm and comfortable, elderly frail Respiratory system: Clear to auscultation. Respiratory effort normal. Cardiovascular system: S1 & S2 heard, RRR. No JVD, murmurs, rubs, gallops or clicks. No pedal edema. Gastrointestinal system: Abdomen is nondistended, soft and nontender. No organomegaly or masses felt. Normal bowel sounds heard. Central nervous system: Alert and oriented to name and place.  No obvious focal neurodeficits Extremities: Symmetric 5 x 5 power. Skin: No rashes, lesions or ulcers Psychiatry: Judgement and insight appear poor                Diet Orders (From admission, onward)     Start     Ordered   10/06/23 2251  Diet Heart Room service appropriate? Yes; Fluid consistency: Thin  Diet effective now       Question Answer Comment  Room service appropriate? Yes   Fluid consistency: Thin      10/06/23 2250            Objective: Vitals:  10/11/23 0619 10/11/23 0912 10/11/23 1316 10/11/23 1317  BP: 129/82  (!) 138/95 (!) 138/95  Pulse: 96  97 97  Resp: 17  17   Temp: 97.7 F (36.5 C)  97.7 F (36.5 C)   TempSrc:   Oral   SpO2: 100% 99% 98%   Weight:      Height:         Intake/Output Summary (Last 24 hours) at 10/11/2023 1352 Last data filed at 10/11/2023 0612 Gross per 24 hour  Intake --  Output 300 ml  Net -300 ml   Filed Weights   10/06/23 2316  Weight: 64 kg    Scheduled Meds:  atorvastatin   20 mg Oral Daily   carvedilol  6.25 mg Oral BID WC   enoxaparin  (LOVENOX ) injection  40 mg Subcutaneous Q24H   escitalopram  10 mg Oral Daily   fluticasone   1 spray Each Nare Daily   fluticasone  furoate-vilanterol  1 puff Inhalation Daily   haloperidol  lactate  2 mg Intravenous Once   LORazepam  1 mg Intravenous Once   OLANZapine   7.5 mg Oral QHS   pantoprazole   40 mg Oral Daily   polyethylene glycol  17 g Oral BID   sodium chloride  flush  3 mL Intravenous Q12H   sodium chloride  flush  3 mL Intravenous Q12H   Continuous Infusions:   ceFAZolin  (ANCEF ) IV 2 g (10/11/23 1335)    Nutritional status     Body mass index is 22.1 kg/m.  Data Reviewed:   CBC: Recent Labs  Lab 10/06/23 2036 10/07/23 0423 10/08/23 0535 10/09/23 0453 10/10/23 0747 10/11/23 0436  WBC 6.9 5.8 5.4 6.5 7.2 6.0  NEUTROABS 4.2  --   --   --   --   --   HGB 11.3* 10.1* 10.1* 11.2* 11.5* 9.6*  HCT 37.2 34.0* 33.4* 37.7 35.7* 31.6*  MCV 92.1 94.2 92.3 93.5 89.9 93.2  PLT 384 311 297 320 353 270   Basic Metabolic Panel: Recent Labs  Lab 10/07/23 0423 10/08/23 0535 10/09/23 0453 10/10/23 0747 10/11/23 0436  NA 139 142 141 139 139  K 3.4* 3.6 3.6 3.3* 3.8  CL 106 110 107 105 107  CO2 25 25 23 25 26   GLUCOSE 103* 101* 100* 111* 103*  BUN 22 16 18 19  28*  CREATININE 0.70 0.69 0.77 0.79 0.87  CALCIUM  8.9 8.9 9.4 9.1 8.7*  MG  --  2.1 2.4 2.3 2.3  PHOS  --  3.3  --   --   --    GFR: Estimated Creatinine Clearance: 43.5 mL/min (by C-G formula based on SCr of 0.87 mg/dL). Liver Function Tests: Recent Labs  Lab 10/06/23 2036  AST 23  ALT 19  ALKPHOS 101  BILITOT 0.6  PROT 7.8  ALBUMIN 4.0   No results for input(s): "LIPASE", "AMYLASE" in the last  168 hours. Recent Labs  Lab 10/07/23 1016  AMMONIA 18   Coagulation Profile: No results for input(s): "INR", "PROTIME" in the last 168 hours. Cardiac Enzymes: No results for input(s): "CKTOTAL", "CKMB", "CKMBINDEX", "TROPONINI" in the last 168 hours. BNP (last 3 results) No results for input(s): "PROBNP" in the last 8760 hours. HbA1C: No results for input(s): "HGBA1C" in the last 72 hours. CBG: No results for input(s): "GLUCAP" in the last 168 hours. Lipid Profile: No results for input(s): "CHOL", "HDL", "LDLCALC", "TRIG", "CHOLHDL", "LDLDIRECT" in the last 72 hours. Thyroid  Function Tests: No results for input(s): "TSH", "T4TOTAL", "FREET4", "T3FREE", "THYROIDAB" in the last  72 hours. Anemia Panel: No results for input(s): "VITAMINB12", "FOLATE", "FERRITIN", "TIBC", "IRON ", "RETICCTPCT" in the last 72 hours. Sepsis Labs: Recent Labs  Lab 10/06/23 2258  LATICACIDVEN 1.0    Recent Results (from the past 240 hours)  Urine Culture     Status: Abnormal   Collection Time: 10/06/23  9:18 PM   Specimen: Urine, Catheterized  Result Value Ref Range Status   Specimen Description   Final    URINE, CATHETERIZED Performed at [facility], [address], [place], [place] [address]    Special Requests   Final    NONE Performed at [facility], [address], [place], [place] [address]    Culture >=100,000 COLONIES/mL ESCHERICHIA COLI (A)  Final   Report Status 10/08/2023 FINAL  Final   Organism ID, Bacteria ESCHERICHIA COLI (A)  Final      Susceptibility   Escherichia coli - MIC*    AMPICILLIN 16 INTERMEDIATE Intermediate     CEFAZOLIN  <=4 SENSITIVE Sensitive     CEFEPIME <=0.12 SENSITIVE Sensitive     CEFTRIAXONE  <=0.25 SENSITIVE Sensitive     CIPROFLOXACIN  <=0.25 SENSITIVE Sensitive     GENTAMICIN <=1 SENSITIVE Sensitive     IMIPENEM <=0.25 SENSITIVE Sensitive     NITROFURANTOIN  <=16 SENSITIVE Sensitive     TRIMETH/SULFA >=320  RESISTANT Resistant     AMPICILLIN/SULBACTAM 8 SENSITIVE Sensitive     PIP/TAZO 8 SENSITIVE Sensitive ug/mL    * >=100,000 COLONIES/mL ESCHERICHIA COLI  Culture, blood (routine x 2)     Status: Abnormal   Collection Time: 10/06/23 10:49 PM   Specimen: BLOOD  Result Value Ref Range Status   Specimen Description   Final    BLOOD RIGHT ANTECUBITAL Performed at [facility], [address] [place], [place] [address]    Special Requests   Final    BOTTLES DRAWN AEROBIC AND ANAEROBIC Blood Culture results may not be optimal due to an inadequate volume of blood received in culture bottles Performed at [facility], [address], [place], [place] [address]    Culture  Setup Time   Final    GRAM POSITIVE COCCI AEROBIC BOTTLE ONLY Organism ID to follow CRITICAL RESULT CALLED TO, READ BACK BY AND VERIFIED WITH: L POINDEXTER,[email or web address]  10/08/23 MK Performed at [facility], [address] [place], [place] [address]    Culture STREPTOCOCCUS MITIS/ORALIS (A)  Final   Report Status 10/09/2023 FINAL  Final   Organism ID, Bacteria STREPTOCOCCUS MITIS/ORALIS  Final      Susceptibility   Streptococcus mitis/oralis - MIC*    PENICILLIN <=0.06 SENSITIVE Sensitive     CEFTRIAXONE  <=0.12 SENSITIVE Sensitive     LEVOFLOXACIN 1 SENSITIVE Sensitive     VANCOMYCIN 0.5 SENSITIVE Sensitive     * STREPTOCOCCUS MITIS/ORALIS  Culture, blood (routine x 2)     Status: None (Preliminary result)   Collection Time: 10/06/23 10:49 PM   Specimen: BLOOD LEFT HAND  Result Value Ref Range Status   Specimen Description   Final    BLOOD LEFT HAND Performed at [facility], [address] [place], [place] [address]    Special Requests   Final    BOTTLES DRAWN AEROBIC AND ANAEROBIC Blood Culture results may not be optimal due to an inadequate volume of blood received in culture bottles Performed at [facility], [address] [place],  [place] [address]    Culture   Final  NO GROWTH 4 DAYS Performed at [facility], [address] [place], [place] [address]    Report Status PENDING  Incomplete  Blood Culture ID Panel (Reflexed)     Status: Abnormal   Collection Time: 10/06/23 10:49 PM  Result Value Ref Range Status   Enterococcus faecalis NOT DETECTED NOT DETECTED Final   Enterococcus Faecium NOT DETECTED NOT DETECTED Final   Listeria monocytogenes NOT DETECTED NOT DETECTED Final   Staphylococcus species NOT DETECTED NOT DETECTED Final   Staphylococcus aureus (BCID) NOT DETECTED NOT DETECTED Final   Staphylococcus epidermidis NOT DETECTED NOT DETECTED Final   Staphylococcus lugdunensis NOT DETECTED NOT DETECTED Final   Streptococcus species DETECTED (A) NOT DETECTED Final    Comment: Not Enterococcus species, Streptococcus agalactiae, Streptococcus pyogenes, or Streptococcus pneumoniae. CRITICAL RESULT CALLED TO, READ BACK BY AND VERIFIED WITH: L POINDEXTER,[email or web address]  10/08/23 MK    Streptococcus agalactiae NOT DETECTED NOT DETECTED Final   Streptococcus pneumoniae NOT DETECTED NOT DETECTED Final   Streptococcus pyogenes NOT DETECTED NOT DETECTED Final   A.calcoaceticus-baumannii NOT DETECTED NOT DETECTED Final   Bacteroides fragilis NOT DETECTED NOT DETECTED Final   Enterobacterales NOT DETECTED NOT DETECTED Final   Enterobacter cloacae complex NOT DETECTED NOT DETECTED Final   Escherichia coli NOT DETECTED NOT DETECTED Final   Klebsiella aerogenes NOT DETECTED NOT DETECTED Final   Klebsiella oxytoca NOT DETECTED NOT DETECTED Final   Klebsiella pneumoniae NOT DETECTED NOT DETECTED Final   Proteus species NOT DETECTED NOT DETECTED Final   Salmonella species NOT DETECTED NOT DETECTED Final   Serratia marcescens NOT DETECTED NOT DETECTED Final   Haemophilus influenzae NOT DETECTED NOT DETECTED Final   Neisseria meningitidis NOT DETECTED NOT DETECTED Final   Pseudomonas aeruginosa NOT DETECTED NOT DETECTED Final    Stenotrophomonas maltophilia NOT DETECTED NOT DETECTED Final   Candida albicans NOT DETECTED NOT DETECTED Final   Candida auris NOT DETECTED NOT DETECTED Final   Candida glabrata NOT DETECTED NOT DETECTED Final   Candida krusei NOT DETECTED NOT DETECTED Final   Candida parapsilosis NOT DETECTED NOT DETECTED Final   Candida tropicalis NOT DETECTED NOT DETECTED Final   Cryptococcus neoformans/gattii NOT DETECTED NOT DETECTED Final    Comment: Performed at [facility], [address], [place], [place] [address]         Radiology Studies: ECHOCARDIOGRAM COMPLETE Result Date: 10/09/2023    ECHOCARDIOGRAM REPORT   Patient Name:   Jacqueline Foley Date of Exam: 10/09/2023 Medical Rec #:  [id number]        Height:       67.0 in Accession #:    [id number]       Weight:       141.1 lb Date of Birth:  Mar 12, 1933        BSA:          1.744 m Patient Age:    89 years         BP:           128/93 mmHg Patient Gender: F                HR:           98 bpm. Exam Location:  Inpatient Procedure: 2D Echo, Color Doppler and Cardiac Doppler (Both Spectral and Color            Flow Doppler were utilized during procedure). Indications:    Bacteremia  History:        Patient has  prior history of Echocardiogram examinations, most                 recent 07/08/2023. TIA, Signs/Symptoms:Chest Pain; Risk                 Factors:Diabetes, Hypertension and Dyslipidemia. CKD stage 3.  Sonographer:    Juanita Shaw Referring Phys: [id number] Maxima Skelton C Marrian Bells IMPRESSIONS  1. Left ventricular ejection fraction, by estimation, is 60 to 65%. The left ventricle has normal function. The left ventricle has no regional wall motion abnormalities. There is moderate asymmetric left ventricular hypertrophy of the basal-septal segment. Indeterminate diastolic filling due to E-A fusion.  2. Right ventricular systolic function is normal. The right ventricular size is normal.  3. The mitral valve is normal in structure. Mild mitral valve  regurgitation. No evidence of mitral stenosis.  4. The aortic valve is calcified. Aortic valve regurgitation is not visualized. Aortic valve sclerosis is present, with no evidence of aortic valve stenosis.  5. The inferior vena cava is normal in size with greater than 50% respiratory variability, suggesting right atrial pressure of 3 mmHg. FINDINGS  Left Ventricle: Left ventricular ejection fraction, by estimation, is 60 to 65%. The left ventricle has normal function. The left ventricle has no regional wall motion abnormalities. Strain was performed and the global longitudinal strain is indeterminate. The left ventricular internal cavity size was normal in size. There is moderate asymmetric left ventricular hypertrophy of the basal-septal segment. Indeterminate diastolic filling due to E-A fusion. Right Ventricle: The right ventricular size is normal. No increase in right ventricular wall thickness. Right ventricular systolic function is normal. Left Atrium: Left atrial size was normal in size. Right Atrium: Right atrial size was normal in size. Pericardium: There is no evidence of pericardial effusion. Mitral Valve: The mitral valve is normal in structure. Mild mitral valve regurgitation. No evidence of mitral valve stenosis. MV peak gradient, 4.7 mmHg. The mean mitral valve gradient is 2.0 mmHg. Tricuspid Valve: The tricuspid valve is normal in structure. Tricuspid valve regurgitation is not demonstrated. No evidence of tricuspid stenosis. Aortic Valve: The aortic valve is calcified. There is mild to moderate aortic valve annular calcification. Aortic valve regurgitation is not visualized. Aortic valve sclerosis is present, with no evidence of aortic valve stenosis. Aortic valve mean gradient measures 3.0 mmHg. Aortic valve peak gradient measures 6.2 mmHg. Aortic valve area, by VTI measures 1.28 cm. Pulmonic Valve: The pulmonic valve was normal in structure. Pulmonic valve regurgitation is not visualized. No  evidence of pulmonic stenosis. Aorta: The aortic root and ascending aorta are structurally normal, with no evidence of dilitation. Venous: The inferior vena cava is normal in size with greater than 50% respiratory variability, suggesting right atrial pressure of 3 mmHg. IAS/Shunts: The atrial septum is grossly normal. Additional Comments: 3D was performed not requiring image post processing on an independent workstation and was indeterminate.  LEFT VENTRICLE PLAX 2D LVIDd:         3.10 cm     Diastology LVIDs:         2.20 cm     LV e' medial:    5.33 cm/s LV PW:         0.80 cm     LV E/e' medial:  11.8 LV IVS:        0.90 cm     LV e' lateral:   8.38 cm/s LVOT diam:     1.70 cm     LV E/e' lateral: 7.5 LV SV:  25 LV SV Index:   14 LVOT Area:     2.27 cm  LV Volumes (MOD) LV vol d, MOD A2C: 70.3 ml LV vol d, MOD A4C: 91.6 ml LV vol s, MOD A2C: 32.3 ml LV vol s, MOD A4C: 28.5 ml LV SV MOD A2C:     38.0 ml LV SV MOD A4C:     91.6 ml LV SV MOD BP:      49.1 ml RIGHT VENTRICLE             IVC RV Basal diam:  3.10 cm     IVC diam: 1.20 cm RV Mid diam:    1.90 cm RV S prime:     10.60 cm/s TAPSE (M-mode): 1.7 cm LEFT ATRIUM             Index        RIGHT ATRIUM          Index LA diam:        2.60 cm 1.49 cm/m   RA Area:     7.56 cm LA Vol (A2C):   15.8 ml 9.06 ml/m   RA Volume:   12.90 ml 7.40 ml/m LA Vol (A4C):   24.4 ml 13.99 ml/m LA Biplane Vol: 19.8 ml 11.36 ml/m  AORTIC VALVE                    PULMONIC VALVE AV Area (Vmax):    1.60 cm     PV Vmax:       0.95 m/s AV Area (Vmean):   1.35 cm     PV Peak grad:  3.6 mmHg AV Area (VTI):     1.28 cm AV Vmax:           124.00 cm/s AV Vmean:          82.200 cm/s AV VTI:            0.191 m AV Peak Grad:      6.2 mmHg AV Mean Grad:      3.0 mmHg LVOT Vmax:         87.30 cm/s LVOT Vmean:        48.800 cm/s LVOT VTI:          0.108 m LVOT/AV VTI ratio: 0.57  AORTA Ao Root diam: 2.70 cm Ao Asc diam:  2.40 cm MITRAL VALVE MV Area (PHT): 4.57 cm     SHUNTS MV  Area VTI:   1.67 cm     Systemic VTI:  0.11 m MV Peak grad:  4.7 mmHg     Systemic Diam: 1.70 cm MV Mean grad:  2.0 mmHg MV Vmax:       1.08 m/s MV Vmean:      63.8 cm/s MV Decel Time: 166 msec MV E velocity: 62.80 cm/s MV A velocity: 104.00 cm/s MV E/A ratio:  0.60 Gloriann Larger MD Electronically signed by Gloriann Larger MD Signature Date/Time: 10/09/2023/4:45:23 PM    Final            LOS: 5 days   Time spent= 35 mins    Maggie Schooner, MD [facility]  If 7PM-7AM, please contact night-coverage  10/11/2023, 1:52 PM

## 2023-10-11 NOTE — Progress Notes (Signed)
 Physical Therapy Treatment Patient Details Name: Jacqueline Foley MRN: [id number] DOB: 25-Jan-1933 Today's Date: 10/11/2023   History of Present Illness 89 y.o. female adm 5/8 with medical history significant of generalized anxiety disorder with depression, memory impairment/dementia, polio,left femoral intertrochanteric fracture s/p IM nailing repair 08/15/2023, CKD stage IIIa, hypertension, and hyperlipidemia presented to emergency department for mental status change include increased paranoia and delusion.  Just discharged from [facility], with planned [facility] at daughter's home.  Cleared for WBAT.    PT Comments  The patient is alert and participatory. Patient ambulated x 40' using Rw and min assist with recliner following for safety.Patient will benefit from continued inpatient follow up therapy, <3 hours/day.  Patient reports a pain on the left side of the head_- fronto parietal area.  Continue PT FOR MOBILITY.   If plan is discharge home, recommend the following: A little help with walking and/or transfers;Help with stairs or ramp for entrance;Supervision due to cognitive status;Assist for transportation;Direct supervision/assist for financial management;Direct supervision/assist for medications management;Assistance with cooking/housework;A little help with bathing/dressing/bathroom   Can travel by private vehicle     Yes  Equipment Recommendations  None recommended by PT    Recommendations for Other Services       Precautions / Restrictions Precautions Precautions: Fall Precaution/Restrictions Comments: has recently been fitted for AFO for Left foot, post polio Restrictions LLE Weight Bearing Per Provider Order: Weight bearing as tolerated Other Position/Activity Restrictions: use sketchers     Mobility  Bed Mobility Overal bed mobility: Needs Assistance Bed Mobility: Supine to Sit     Supine to sit: Min assist     General bed mobility comments: extra time , cues to move legs,  asssit legs to bed edge    Transfers Overall transfer level: Needs assistance Equipment used: Rolling walker (2 wheels) Transfers: Sit to/from Stand Sit to Stand: Min assist           General transfer comment: min assistance  to stand at [employer]    Ambulation/Gait Ambulation/Gait assistance: Min assist, +2 safety/equipment Gait Distance (Feet): 40 Feet Assistive device: Rolling walker (2 wheels) Gait Pattern/deviations: Step-to pattern, Narrow base of support, Step-through pattern Gait velocity: decreased     General Gait Details: forward flexion, increased time and assist with problem solving for avoiding obstacles with RW. Cues for close proximity to RW.   Stairs             Wheelchair Mobility     Tilt Bed    Modified Rankin (Stroke Patients Only)       Balance Overall balance assessment: Needs assistance, History of Falls Sitting-balance support: Feet supported Sitting balance-Leahy Scale: Good     Standing balance support: Reliant on assistive device for balance, During functional activity, Bilateral upper extremity supported Standing balance-Leahy Scale: Poor Standing balance comment: Narrow BOS, decreased safety with RW                            Communication Communication Factors Affecting Communication: Difficulty expressing self  Cognition Arousal: Alert Behavior During Therapy: WFL for tasks assessed/performed   PT - Cognitive impairments: History of cognitive impairments   Orientation impairments: Person                   PT - Cognition Comments: knows had left hip surgery Following commands: Impaired Following commands impaired: Follows one step commands with increased time    Cueing Cueing Techniques: Verbal cues, Tactile cues,  Visual cues  Exercises General Exercises - Lower Extremity Short Arc Quad: AAROM, Left, 15 reps Heel Slides: AAROM, Left, 10 reps Hip ABduction/ADduction: AAROM, Left, 10 reps    General  Comments        Pertinent Vitals/Pain Pain Assessment Pain Assessment: PAINAD Breathing: normal Negative Vocalization: occasional moan/groan, low speech, negative/disapproving quality Facial Expression: sad, frightened, frown Body Language: tense, distressed pacing, fidgeting Consolability: no need to console PAINAD Score: 3 Pain Location: left  fronto-parietal_ places hand across left side of head Pain Descriptors / Indicators: Sharp Pain Intervention(s): Monitored during session    Home Living                          Prior Function            PT Goals (current goals can now be found in the care plan section) Progress towards PT goals: Progressing toward goals    Frequency    Min 3X/week      PT Plan      Co-evaluation              AM-PAC PT "6 Clicks" Mobility   Outcome Measure  Help needed turning from your back to your side while in a flat bed without using bedrails?: A Little Help needed moving from lying on your back to sitting on the side of a flat bed without using bedrails?: A Little Help needed moving to and from a bed to a chair (including a wheelchair)?: A Little Help needed standing up from a chair using your arms (e.g., wheelchair or bedside chair)?: A Little Help needed to walk in hospital room?: A Little Help needed climbing 3-5 steps with a railing? : A Lot 6 Click Score: 17    End of Session Equipment Utilized During Treatment: Gait belt Activity Tolerance: Patient tolerated treatment well Patient left: in chair;with call bell/phone within reach;with chair alarm set;with family/visitor present Nurse Communication: Mobility status PT Visit Diagnosis: Unsteadiness on feet (R26.81);History of falling (Z91.81);Difficulty in walking, not elsewhere classified (R26.2)     Time: 2440-1027 PT Time Calculation (min) (ACUTE ONLY): 28 min  Charges:    $Gait Training: 8-22 mins $Therapeutic Exercise: 8-22 mins PT General  Charges $$ ACUTE PT VISIT: 1 Visit                     Abelina Hoes PT Acute Rehabilitation Services Office [phone]     Dareen Ebbing 10/11/2023, 4:14 PM

## 2023-10-11 NOTE — Progress Notes (Signed)
 I assisted Jacqueline Foley with getting her documents notarized.  A copy was placed in her shadow chart and the original was returned to her.  Provided spiritual support as well through prayer.

## 2023-10-11 NOTE — Plan of Care (Signed)

## 2023-10-11 NOTE — TOC Progression Note (Addendum)
 Transition of Care [facility]) - Progression Note    Patient Details  Name: Jacqueline Foley MRN: [id number] Date of Birth: 02/09/1933  Transition of [facility]) CM/SW Contact  Kathryn Parish, RN Phone Number: 10/11/2023, 9:51 AM  Clinical Narrative:    NCM called Cheri daughter. She was driving to hospital and will call me when she arrives. 11:30 AM NCM met with daughter in room to discuss SNF, medicare coverage and private duty agencies. Daughter understands that out of pocket will be about $210 daily and [facility] doesn't have a bed for next 48 hours. Daughter requested bed offers to be sent out to SNF. 3:14 PM Pt daughter requested NCM call via secure chat. No answer left message.  Expected Discharge Plan: Home w Home Health Services Barriers to Discharge: Continued Medical Work up  Expected Discharge Plan and Services In-house Referral: Chaplain Discharge Planning Services: CM Consult Post Acute Care Choice: NA Living arrangements for the past 2 months: Assisted Living Facility Administrator Living)                 DME Arranged: N/A DME Agency: NA       HH Arranged: NA HH Agency: NA         Social Determinants of Health (SDOH) Interventions SDOH Screenings   Food Insecurity: No Food Insecurity (10/06/2023)  Housing: Low Risk  (10/06/2023)  Transportation Needs: No Transportation Needs (10/06/2023)  Utilities: Not At Risk (10/06/2023)  Social Connections: Socially Isolated (10/06/2023)  Tobacco Use: Low Risk  (10/06/2023)    Readmission Risk Interventions    10/07/2023    2:59 PM 08/14/2023    8:41 AM  Readmission Risk Prevention Plan  Transportation Screening Complete Complete  PCP or Specialist Appt within 5-7 Days Complete Complete  Home Care Screening Complete Complete  Medication Review (RN CM) Complete Complete

## 2023-10-11 NOTE — Plan of Care (Signed)

## 2023-10-12 ENCOUNTER — Inpatient Hospital Stay (HOSPITAL_COMMUNITY)

## 2023-10-12 DIAGNOSIS — G9341 Metabolic encephalopathy: Secondary | ICD-10-CM | POA: Diagnosis not present

## 2023-10-12 LAB — MAGNESIUM: Magnesium: 2.3 mg/dL (ref 1.7–2.4)

## 2023-10-12 LAB — CULTURE, BLOOD (ROUTINE X 2): Culture: NO GROWTH

## 2023-10-12 LAB — CBC
HCT: 33.1 % — ABNORMAL LOW (ref 36.0–46.0)
Hemoglobin: 10 g/dL — ABNORMAL LOW (ref 12.0–15.0)
MCH: 28.7 pg (ref 26.0–34.0)
MCHC: 30.2 g/dL (ref 30.0–36.0)
MCV: 94.8 fL (ref 80.0–100.0)
Platelets: 271 10*3/uL (ref 150–400)
RBC: 3.49 MIL/uL — ABNORMAL LOW (ref 3.87–5.11)
RDW: 14.4 % (ref 11.5–15.5)
WBC: 5.1 10*3/uL (ref 4.0–10.5)
nRBC: 0 % (ref 0.0–0.2)

## 2023-10-12 LAB — BASIC METABOLIC PANEL WITH GFR
Anion gap: 7 (ref 5–15)
BUN: 28 mg/dL — ABNORMAL HIGH (ref 8–23)
CO2: 23 mmol/L (ref 22–32)
Calcium: 8.8 mg/dL — ABNORMAL LOW (ref 8.9–10.3)
Chloride: 109 mmol/L (ref 98–111)
Creatinine, Ser: 1.02 mg/dL — ABNORMAL HIGH (ref 0.44–1.00)
GFR, Estimated: 53 mL/min — ABNORMAL LOW (ref >60)
Glucose, Bld: 98 mg/dL (ref 70–99)
Potassium: 4 mmol/L (ref 3.5–5.1)
Sodium: 139 mmol/L (ref 135–145)

## 2023-10-12 NOTE — Care Management Important Message (Signed)
 Important Message  Patient Details  Name: Jacqueline Foley MRN: [id number] Date of Birth: 04-09-1933   Important Message Given:        Peyton Brash 10/12/2023, 4:37 PM

## 2023-10-12 NOTE — Plan of Care (Signed)
  Problem: Education: Goal: Knowledge of General Education information will improve Description: Including pain rating scale, medication(s)/side effects and non-pharmacologic comfort measures Outcome: Progressing   Problem: Clinical Measurements: Goal: Will remain free from infection Outcome: Progressing   Problem: Nutrition: Goal: Adequate nutrition will be maintained Outcome: Progressing   Problem: Elimination: Goal: Will not experience complications related to bowel motility Outcome: Progressing Goal: Will not experience complications related to urinary retention Outcome: Progressing   Problem: Pain Managment: Goal: General experience of comfort will improve and/or be controlled Outcome: Progressing   Problem: Safety: Goal: Ability to remain free from injury will improve Outcome: Progressing   Problem: Skin Integrity: Goal: Risk for impaired skin integrity will decrease Outcome: Progressing

## 2023-10-12 NOTE — TOC Progression Note (Addendum)
 Transition of Care [facility]) - Progression Note    Patient Details  Name: Nima Gassen MRN: [id number] Date of Birth: 1932/11/18  Transition of [facility]) CM/SW Contact  Kathryn Parish, RN Phone Number: 10/12/2023, 8:57 AM  Clinical Narrative:     NCM update FL2. Removed Memory Care as a need. Patient was declined  do to request for Memory Care. Sent text messages to several SNF asking to reconsider patient. 9:06 AM [facility] replied No bed available. Patients daughter updated. 11:27 AM  MD updated that patient with new cough, am getting an xray--lets hold off on dc till tomorrow thanks  4:34 PM Updated patient daughter on SNF that have accepted.   Expected Discharge Plan: Home w Home Health Services Barriers to Discharge: Continued Medical Work up  Expected Discharge Plan and Services In-house Referral: Chaplain Discharge Planning Services: CM Consult Post Acute Care Choice: NA Living arrangements for the past 2 months: Assisted Living Facility Administrator Living)                 DME Arranged: N/A DME Agency: NA       HH Arranged: NA HH Agency: NA         Social Determinants of Health (SDOH) Interventions SDOH Screenings   Food Insecurity: No Food Insecurity (10/06/2023)  Housing: Low Risk  (10/06/2023)  Transportation Needs: No Transportation Needs (10/06/2023)  Utilities: Not At Risk (10/06/2023)  Social Connections: Socially Isolated (10/06/2023)  Tobacco Use: Low Risk  (10/06/2023)    Readmission Risk Interventions    10/07/2023    2:59 PM 08/14/2023    8:41 AM  Readmission Risk Prevention Plan  Transportation Screening Complete Complete  PCP or Specialist Appt within 5-7 Days Complete Complete  Home Care Screening Complete Complete  Medication Review (RN CM) Complete Complete

## 2023-10-12 NOTE — Evaluation (Addendum)
 Clinical/Bedside Swallow Evaluation Patient Details  Name: Jacqueline Foley MRN: [id number] Date of Birth: 1933/03/10  Today's Date: 10/12/2023 Time: SLP Start Time (ACUTE ONLY): 1351 SLP Stop Time (ACUTE ONLY): 1450 SLP Time Calculation (min) (ACUTE ONLY): 59 min  Past Medical History:  Past Medical History:  Diagnosis Date   Chest pain at rest, negative MI, negative stress test, may have been GI 12/06/2013   Diabetes mellitus without complication (HCC)    DOE (dyspnea on exertion), may be due to HTN 12/06/2013   GERD (gastroesophageal reflux disease)    H/O diabetes mellitus    HTN (hypertension) 12/06/2013   Hypercholesteremia    Hypertension    TIA (transient ischemic attack)    Past Surgical History:  Past Surgical History:  Procedure Laterality Date   CATARACT EXTRACTION, BILATERAL  2018   EYE SURGERY     FEMUR IM NAIL Left 08/15/2023   Procedure: INSERTION, INTRAMEDULLARY ROD, FEMUR;  Surgeon: Claiborne Crew, MD;  Location: [facility];  Service: Orthopedics;  Laterality: Left;   FOOT SURGERY     HPI:  Per OT note, "89 y.o. female adm 5/8 with medical history significant of generalized anxiety disorder with depression, memory impairment/dementia, polio,left femoral intertrochanteric fracture s/p IM nailing repair 08/15/2023, CKD stage IIIa, hypertension, and hyperlipidemia presented to emergency department for mental status change include increased paranoia and delusion.  Just discharged from [facility], with planned [facility] at daughter's home.  Cleared for WBAT."  Swallow eval ordered.  Family reports pt coughs with intake - pt reports this has been chronic.  Pt has had weight loss per her daughter and granddaughter. Pt reports she has gustatory changes and gets full quickly.    Assessment / Plan / Recommendation  Clinical Impression  Patient demonstrates suspected primary esophageal dysphagia - based on clinical observations and pt's reported symptoms.  Patient reports issues with xerostomia,  ill fitting dentures contributing to deficits.  She has been on a PPI in the past *was just put on one here* and reports sensing she "gets full" easily- pointing to mid-chest region.  No focal CN deficits present x minimal right facial asymmetry.    Family *daughter and granddaughter* report pt coughs with every meal since in hospital.   This occurs more with foods than liquids.  Granddaughter states this normally occurs after several bites into a meal.   SLP observed pt consuming Ensure, water , gingerale, graham crackers and sweetened applesauce.  Subtle cough noted x3 during entire snack - but no significant episodes.  Discussed with pt, granddaughter and daughter that SLP role is dysphagia/aspiration mitigation.     Fortunately pt has not had pneumonias but suspect she has a component of chronic dysphagia and potentially even some episodic aspiration.  Suspect component of esophageal dysmotility. Discussed general aspiration precautions and posted signs for mitigation strategies.    Advised to consider esophagram (if MD agreeable) if desire to rule out source that may benefit from surgical intervention *i.e. stricture as family is concerned pt's dysphagia is negatively impacting her nutrition.  RN reports family agreeable to esophagram if MD orders.       89 y.o. female adm 5/8 with medical history significant of generalized anxiety disorder with depression, memory impairment/dementia, polio,left femoral intertrochanteric fracture s/p IM nailing repair 08/15/2023, CKD stage IIIa, hypertension, and hyperlipidemia presented to emergency department for mental status change include increased paranoia and delusion.  Just discharged from [facility], with planned [facility] at daughter's home.  Cleared for WBAT.   SLP Visit  Diagnosis: Dysphagia, unspecified (R13.10)    Aspiration Risk  Moderate aspiration risk    Diet Recommendation Dysphagia 3 (Mech soft);Thin liquid (order softer foods, extra gravy/sauce;  consider Ensure supplement)    Liquid Administration via: Cup;Straw Medication Administration: Crushed with puree Supervision: Patient able to self feed;Full supervision/cueing for compensatory strategies Compensations: Slow rate;Small sips/bites Postural Changes: Seated upright at 90 degrees;Remain upright for at least 30 minutes after po intake    Other  Recommendations Oral Care Recommendations: Oral care BID    Recommendations for follow up therapy are one component of a multi-disciplinary discharge planning process, led by the attending physician.  Recommendations may be updated based on patient status, additional functional criteria and insurance authorization.  Follow up Recommendations Other (comment)      Assistance Recommended at Discharge    Functional Status Assessment Patient has had a recent decline in their functional status and demonstrates the ability to make significant improvements in function in a reasonable and predictable amount of time.  Frequency and Duration min 1 x/week  1 week       Prognosis Prognosis for improved oropharyngeal function: Fair Barriers to Reach Goals: Cognitive deficits;Time post onset      Swallow Study   General Date of Onset: 10/12/23 HPI: Per OT note, "89 y.o. female adm 5/8 with medical history significant of generalized anxiety disorder with depression, memory impairment/dementia, polio,left femoral intertrochanteric fracture s/p IM nailing repair 08/15/2023, CKD stage IIIa, hypertension, and hyperlipidemia presented to emergency department for mental status change include increased paranoia and delusion.  Just discharged from [facility], with planned [facility] at daughter's home.  Cleared for WBAT."  Swallow eval ordered.  Family reports pt coughs with intake - pt reports this has been chronic.  Pt has had weight loss per her daughter and granddaughter. Pt reports she has gustatory changes and gets full quickly. Type of Study: Bedside Swallow  Evaluation Diet Prior to this Study: Regular;Thin liquids (Level 0) Temperature Spikes Noted: No Respiratory Status: Nasal cannula (since 10/11/2023) History of Recent Intubation: No Behavior/Cognition: Alert;Cooperative;Pleasant mood Oral Cavity Assessment: Within Functional Limits Oral Care Completed by SLP: No Oral Cavity - Dentition: Dentures, top;Other (Comment) (lower partial) Vision: Functional for self-feeding Self-Feeding Abilities: Able to feed self Patient Positioning: Upright in bed Baseline Vocal Quality: Hoarse;Other (comment) (hoarse - pt reports it has worsened since hospital admission) Volitional Cough: Strong Volitional Swallow: Able to elicit    Oral/Motor/Sensory Function Overall Oral Motor/Sensory Function: Other (comment) (slight facial asymmetry on the right - otherwise no focal CN deficits)   Ice Chips Ice chips: Not tested   Thin Liquid Thin Liquid: Impaired Presentation: Cup;Straw Pharyngeal  Phase Impairments: Cough - Delayed    Nectar Thick Nectar Thick Liquid: Within functional limits Presentation: Cup;Self Fed;Straw   Honey Thick Honey Thick Liquid: Not tested   Puree Puree: Within functional limits Presentation: Self Fed;Spoon   Solid     Solid: Within functional limits Presentation: Self Fed;Spoon      Chantal Comment 10/12/2023,3:34 PM  Maudie Sorrow, MS [facility] SLP Acute Rehab Services Office [phone]

## 2023-10-12 NOTE — Progress Notes (Signed)
 Occupational Therapy Treatment Patient Details Name: Jacqueline Foley MRN: [id number] DOB: August 04, 1932 Today's Date: 10/12/2023   History of present illness 89 y.o. female adm 5/8 with medical history significant of generalized anxiety disorder with depression, memory impairment/dementia, polio,left femoral intertrochanteric fracture s/p IM nailing repair 08/15/2023, CKD stage IIIa, hypertension, and hyperlipidemia presented to emergency department for mental status change include increased paranoia and delusion.  Just discharged from [facility], with planned [facility] at daughter's home.  Cleared for WBAT.   OT comments  Patient seen this am for skilled OT session. Daughter arrived mid session for education and participation in treatment. Patient was open and cooperative to all therapy presented below. Seated and standing level supported and assisted ADL's, UE therex and breathing integration with cognitive retraining components included into treatment. OT will continue to follow acutely to allow for improved function and safety. Patient will benefit from continued inpatient follow up therapy, <3 hours/day.        If plan is discharge home, recommend the following:  A lot of help with walking and/or transfers;A lot of help with bathing/dressing/bathroom;Assist for transportation;Help with stairs or ramp for entrance;Direct supervision/assist for medications management;Assistance with cooking/housework;Supervision due to cognitive status   Equipment Recommendations  Tub/shower bench       Precautions / Restrictions Precautions Precautions: Fall Precaution/Restrictions Comments: has recently been fitted for AFO for Left foot, post polio Restrictions Weight Bearing Restrictions Per Provider Order: No LLE Weight Bearing Per Provider Order: Weight bearing as tolerated Other Position/Activity Restrictions: use sketchers       Mobility Bed Mobility Overal bed mobility: Needs Assistance Bed Mobility:  Supine to Sit     Supine to sit: Min assist     General bed mobility comments: min cues    Transfers Overall transfer level: Needs assistance Equipment used: Rolling walker (2 wheels) Transfers: Sit to/from Stand Sit to Stand: Min assist     Step pivot transfers: Min assist     General transfer comment: RW with cues for hand placement and safety     Balance Overall balance assessment: Needs assistance, History of Falls Sitting-balance support: Feet supported Sitting balance-Leahy Scale: Good     Standing balance support: Reliant on assistive device for balance, During functional activity, Bilateral upper extremity supported Standing balance-Leahy Scale: Poor                             ADL either performed or assessed with clinical judgement   ADL Overall ADL's : Needs assistance/impaired Eating/Feeding: Set up;Sitting   Grooming: Wash/dry hands;Wash/dry face;Oral care;Set up;Sitting                               Functional mobility during ADLs: Minimal assistance;Rolling walker (2 wheels) General ADL Comments: cues for all steps of transfers and ADL's    Extremity/Trunk Assessment Upper Extremity Assessment Upper Extremity Assessment: Overall WFL for tasks assessed;Right hand dominant   Lower Extremity Assessment Lower Extremity Assessment: Defer to PT evaluation        Vision   Vision Assessment?: No apparent visual deficits   Perception Perception Perception: Within Functional Limits   Praxis Praxis Praxis: Impaired Praxis Impairment Details: Motor planning;Organization   Communication Communication Communication: Impaired Factors Affecting Communication: Difficulty expressing self   Cognition Arousal: Alert Behavior During Therapy: WFL for tasks assessed/performed Cognition: Cognition impaired       Memory impairment (select all  impairments): Short-term memory Attention impairment (select first level of impairment):  Sustained attention Executive functioning impairment (select all impairments): Reasoning, Problem solving OT - Cognition Comments: worked with patient and daughterf on categories game with target toss to increased cog/processing skills                 Following commands: Impaired Following commands impaired: Follows one step commands with increased time      Cueing   Cueing Techniques: Verbal cues, Tactile cues, Visual cues  Exercises Exercises: General Upper Extremity (B UE target toss 25 reps x 6 sets with short rests seated)       General Comments no skin issues noted, O2 in place with 100% SpO2, instructed patient and dtr on breathing strategies and IS use    Pertinent Vitals/ Pain       Pain Assessment Pain Assessment: Faces Faces Pain Scale: No hurt   Frequency  Min 2X/week        Progress Toward Goals  OT Goals(current goals can now be found in the care plan section)  Progress towards OT goals: Progressing toward goals  Acute Rehab OT Goals Patient Stated Goal: to get stronger OT Goal Formulation: With patient/family Time For Goal Achievement: 10/21/23 Potential to Achieve Goals: Good ADL Goals Pt Will Perform Grooming: with modified independence;standing Pt Will Perform Lower Body Dressing: with modified independence;sit to/from stand Pt Will Transfer to Toilet: with modified independence;regular height toilet;ambulating  Plan         AM-PAC OT "6 Clicks" Daily Activity     Outcome Measure   Help from another person eating meals?: None Help from another person taking care of personal grooming?: A Little Help from another person toileting, which includes using toliet, bedpan, or urinal?: A Lot Help from another person bathing (including washing, rinsing, drying)?: A Lot Help from another person to put on and taking off regular upper body clothing?: A Little Help from another person to put on and taking off regular lower body clothing?: A Lot 6  Click Score: 16    End of Session Equipment Utilized During Treatment: Gait belt;Rolling walker (2 wheels)  OT Visit Diagnosis: Unsteadiness on feet (R26.81);Muscle weakness (generalized) (M62.81);History of falling (Z91.81);Pain   Activity Tolerance Patient tolerated treatment well   Patient Left in chair;with family/visitor present;with chair alarm set;with call bell/phone within reach   Nurse Communication Mobility status        Time: 1308-6578 OT Time Calculation (min): 36 min  Charges: OT General Charges $OT Visit: 1 Visit OT Treatments $Therapeutic Activity: 23-37 mins  Winefred Hillesheim OT/L Acute Rehabilitation Department  [phone]  10/12/2023, 2:17 PM

## 2023-10-12 NOTE — Progress Notes (Signed)
 PROGRESS NOTE    Jacqueline Foley  KGM:[id number]  DOB: 1932/10/15  DOA: 10/06/2023 PCP: [name], DO Outpatient Specialists:   Hospital course:  89 year old with history of GAD, depression, dementia/memory impairment, left hip fracture status post IM nailing 08/15/2023, CKD 3A, HTN, HLD comes to the ED for evaluation of altered mental status/paranoia and delusion.  Upon admission basic metabolic workup was overall unremarkable besides urinary tract infection.  Started on IV Rocephin .  Blood cultures grew Streptococcus mitis and this was hard to distinguish if this was a contaminant.  Reviewed with ID.  Echocardiogram is negative, surveillance cultures are pending.  No further workup warranted at this time.   Subjective:  Patient seen in conjunction with her daughter.  Patient's daughter is concerned that she has had a couple of episodes of choking while eating.  Patient admits to a cough but no overt shortness of breath.  Denies malaise.   Objective: Vitals:   10/11/23 2042 10/12/23 0415 10/12/23 0852 10/12/23 1214  BP: 99/71 116/81  103/71  Pulse: 87 88  80  Resp: 15 16    Temp: 97.7 F (36.5 C) 98.6 F (37 C)  98 F (36.7 C)  TempSrc:  Oral    SpO2: 100% 96% 95% 100%  Weight:      Height:        Intake/Output Summary (Last 24 hours) at 10/12/2023 1513 Last data filed at 10/11/2023 2045 Gross per 24 hour  Intake 240 ml  Output --  Net 240 ml   Filed Weights   10/06/23 2316  Weight: 64 kg     Exam:  General: Patient sitting up in recliner appearing comfortable with attentive daughter at bedside Eyes: sclera anicteric, conjuctiva mild injection bilaterally CVS: S1-S2, regular  Respiratory:  decreased air entry bilaterally secondary to decreased inspiratory effort, rales at bases  GI: NABS, soft, NT  LE: Warm and well-perfused  Data Reviewed:  Basic Metabolic Panel: Recent Labs  Lab 10/08/23 0535 10/09/23 0453 10/10/23 0747 10/11/23 0436  10/12/23 0426  NA 142 141 139 139 139  K 3.6 3.6 3.3* 3.8 4.0  CL 110 107 105 107 109  CO2 25 23 25 26 23   GLUCOSE 101* 100* 111* 103* 98  BUN 16 18 19  28* 28*  CREATININE 0.69 0.77 0.79 0.87 1.02*  CALCIUM  8.9 9.4 9.1 8.7* 8.8*  MG 2.1 2.4 2.3 2.3 2.3  PHOS 3.3  --   --   --   --     CBC: Recent Labs  Lab 10/06/23 2036 10/07/23 0423 10/08/23 0535 10/09/23 0453 10/10/23 0747 10/11/23 0436 10/12/23 0426  WBC 6.9   < > 5.4 6.5 7.2 6.0 5.1  NEUTROABS 4.2  --   --   --   --   --   --   HGB 11.3*   < > 10.1* 11.2* 11.5* 9.6* 10.0*  HCT 37.2   < > 33.4* 37.7 35.7* 31.6* 33.1*  MCV 92.1   < > 92.3 93.5 89.9 93.2 94.8  PLT 384   < > 297 320 353 270 271   < > = values in this interval not displayed.     Scheduled Meds:  atorvastatin   20 mg Oral Daily   carvedilol  6.25 mg Oral BID WC   enoxaparin  (LOVENOX ) injection  40 mg Subcutaneous Q24H   escitalopram  10 mg Oral Daily   fluticasone   1 spray Each Nare Daily   fluticasone  furoate-vilanterol  1 puff Inhalation Daily  haloperidol  lactate  2 mg Intravenous Once   LORazepam  1 mg Intravenous Once   OLANZapine   7.5 mg Oral QHS   pantoprazole   40 mg Oral Daily   polyethylene glycol  17 g Oral BID   sodium chloride  flush  3 mL Intravenous Q12H   sodium chloride  flush  3 mL Intravenous Q12H   Continuous Infusions:   ceFAZolin  (ANCEF ) IV 2 g (10/12/23 1325)     Assessment & Plan:   Choking episode x 2 New cough New oxygen requirement-2 L of oxygen was placed overnight placed overnight Check chest x-ray to rule out aspiration SLP consult placed  Worsening kidney function on CKD 3 AA Baseline creatinine is 0.9 Mild worsening of creatinine overnight Encourage oral hydration  Acute cystitis Urine culture positive for pansensitive E. Coli Patient is completed 5-day course of cefazolin , will discontinue  Streptococcus mitis bacteremia Discussed with ID Echocardiogram negative for vegetations Repeat blood cultures  are NGTD  HTN Controlled on Coreg Maxide was discontinued  Dementia  Paranoid behavior/delusions and hallucinations Acute metabolic encephalopathy--resolved Continue Zyprexa , Lexapro and gabapentin  per psych Further workup including CTA, MRI, TSH and B12 are negative  Copied from previous note: Reactive airway disease Stable.  As needed bronchodilators    Left proximal fever intertrochanteric fracture secondary to fall -Status post IM nailing by Dr. Bernard Brick 08/15/2023.   PT/OT     DVT prophylaxis: Lovenox  Code Status: Full code Family Communication: Patient's daughter was at bedside throughout     Studies: DG CHEST PORT 1 VIEW Result Date: 10/12/2023 CLINICAL DATA:  Aspiration. EXAM: PORTABLE CHEST 1 VIEW COMPARISON:  August 13, 2023 FINDINGS: The heart size and mediastinal contours are within normal limits. Both lungs are clear. The visualized skeletal structures are unremarkable. IMPRESSION: No active disease. Electronically Signed   By: Rosalene Colon M.D.   On: 10/12/2023 13:50    Principal Problem:   Acute metabolic encephalopathy Active Problems:   History of closed left femoral fracture (HCC)   Acute cystitis   Essential hypertension   Hyperlipidemia   Reactive airway disease   Generalized anxiety disorder   History of dementia   CKD (chronic kidney disease), stage III (HCC)   Major depressive disorder, recurrent episode, moderate (HCC)     Jacqueline Foley Jacqueline Foley, [facility]  If 7PM-7AM, please contact night-coverage [email or web address]   LOS: 6 days

## 2023-10-12 NOTE — NC FL2 (Signed)
   MEDICAID [id number] LEVEL OF CARE FORM     IDENTIFICATION  Patient Name: Jacqueline Foley Birthdate: Jul 03, 1932 Sex: female Admission Date (Current Location): 10/06/2023  [facility] and [place] Number:  Producer, television/film/video and Address:  [facility],  [address], [place] [address]      Provider Number: [id number]  Attending Physician Name and Address:  Donley Furth*  Relative Name and Phone Number:  Neal,Cheri (Daughter)  [phone] (Mobile    Current Level of Care: SNF Recommended Level of Care: Skilled Nursing Facility Prior Approval Number:    Date Approved/Denied:   PASRR Number:    Discharge Plan: SNF    Current Diagnoses: Patient Active Problem List   Diagnosis Date Noted   Major depressive disorder, recurrent episode, moderate (HCC) 10/07/2023   Acute cystitis 10/06/2023   Generalized anxiety disorder 10/06/2023   History of dementia 10/06/2023   CKD (chronic kidney disease), stage III (HCC) 10/06/2023   Acute metabolic encephalopathy 10/06/2023   History of closed left femoral fracture (HCC) 08/13/2023   Fall at home, initial encounter 08/13/2023   Acute kidney injury superimposed on CKD (HCC) 08/13/2023   TIA (transient ischemic attack) 08/13/2023   GERD (gastroesophageal reflux disease) 08/13/2023   Hyperlipidemia 08/13/2023   Dementia without behavioral disturbance (HCC) 08/13/2023   Reactive airway disease 08/13/2023   Femur fracture, left (HCC) 08/13/2023   Aortic atherosclerosis (HCC) 03/18/2020   History of chest pain 03/19/2019   Chest pain 11/08/2017   Lumbar radiculopathy 05/02/2017   Precordial chest pain 12/06/2013   Chest pain at rest, negative MI, negative stress test, may have been GI 12/06/2013   Essential hypertension 12/06/2013   DOE (dyspnea on exertion), may be due to HTN 12/06/2013    Orientation RESPIRATION BLADDER Height & Weight     Self, Time, Place  Normal External catheter Weight: 64  kg Height:  5\' 7"  (170.2 cm)  BEHAVIORAL SYMPTOMS/MOOD NEUROLOGICAL BOWEL NUTRITION STATUS      Continent Diet  AMBULATORY STATUS COMMUNICATION OF NEEDS Skin   Limited Assist Verbally Normal                       Personal Care Assistance Level of Assistance              Functional Limitations Info             SPECIAL CARE FACTORS FREQUENCY  PT (By licensed PT), OT (By licensed OT)     PT Frequency: 5x weekly OT Frequency: 5x weekly            Contractures Contractures Info: Not present    Additional Factors Info  Allergies, Psychotropic   Allergies Info: tomoato, protonix  Psychotropic Info: zoloft          Current Medications (10/12/2023):  This is the current hospital active medication list Current Facility-Administered Medications  Medication Dose Route Frequency Provider Last Rate Last Admin   acetaminophen  (TYLENOL ) tablet 650 mg  650 mg Oral Q6H PRN Sundil, Subrina, MD   650 mg at 10/12/23 0038   Or   acetaminophen  (TYLENOL ) suppository 650 mg  650 mg Rectal Q6H PRN Sundil, Subrina, MD       acetaminophen  (TYLENOL ) tablet 325 mg  325 mg Oral BID PRN Amin, Ankit C, MD       atorvastatin  (LIPITOR) tablet 20 mg  20 mg Oral Daily Sundil, Subrina, MD   20 mg at 10/11/23 1316   carvedilol (COREG) tablet 6.25 mg  6.25 mg Oral BID WC Amin, Ankit C, MD   6.25 mg at 10/11/23 1749   ceFAZolin  (ANCEF ) IVPB 2g/100 mL premix  2 g Intravenous Q8H Amin, Ankit C, MD 200 mL/hr at 10/12/23 0508 2 g at 10/12/23 0508   dextromethorphan-guaiFENesin (MUCINEX DM) 30-600 MG per 12 hr tablet 1 tablet  1 tablet Oral BID PRN Amin, Ankit C, MD   1 tablet at 10/11/23 1325   enoxaparin  (LOVENOX ) injection 40 mg  40 mg Subcutaneous Q24H Ellington, Abby K, RPH   40 mg at 10/11/23 1317   escitalopram (LEXAPRO) tablet 10 mg  10 mg Oral Daily Lord, Jamison Y, NP   10 mg at 10/11/23 1317   fluticasone  (FLONASE ) 50 MCG/ACT nasal spray 1 spray  1 spray Each Nare Daily Sundil, Subrina, MD   1  spray at 10/11/23 1318   fluticasone  furoate-vilanterol (BREO ELLIPTA ) 200-25 MCG/ACT 1 puff  1 puff Inhalation Daily Sundil, Subrina, MD   1 puff at 10/11/23 0911   gabapentin  (NEURONTIN ) capsule 100 mg  100 mg Oral TID PRN Lord, Jamison Y, NP   100 mg at 10/11/23 1504   glucagon (human recombinant) (GLUCAGEN) injection 1 mg  1 mg Intravenous PRN Amin, Ankit C, MD       haloperidol  lactate (HALDOL ) injection 1 mg  1 mg Intravenous Q6H PRN Sundil, Subrina, MD   1 mg at 10/09/23 2313   haloperidol  lactate (HALDOL ) injection 2 mg  2 mg Intravenous Once Daniels, James K, NP       hydrALAZINE  (APRESOLINE ) injection 10 mg  10 mg Intravenous Q4H PRN Amin, Ankit C, MD   10 mg at 10/09/23 0358   ipratropium-albuterol  (DUONEB) 0.5-2.5 (3) MG/3ML nebulizer solution 3 mL  3 mL Nebulization Q4H PRN Amin, Ankit C, MD       LORazepam (ATIVAN) injection 1 mg  1 mg Intravenous Once Amin, Ankit C, MD       methocarbamol  (ROBAXIN ) tablet 500 mg  500 mg Oral Q6H PRN Sundil, Subrina, MD   500 mg at 10/11/23 1325   metoprolol  tartrate (LOPRESSOR ) injection 5 mg  5 mg Intravenous Q4H PRN Amin, Ankit C, MD   5 mg at 10/08/23 1736   OLANZapine  (ZYPREXA ) tablet 5 mg  5 mg Oral Q6H PRN Amin, Ankit C, MD   5 mg at 10/10/23 9562   OLANZapine  (ZYPREXA ) tablet 7.5 mg  7.5 mg Oral QHS Lissa Riding, NP   7.5 mg at 10/11/23 2125   ondansetron  (ZOFRAN ) tablet 4 mg  4 mg Oral Q6H PRN Sundil, Subrina, MD       Or   ondansetron  (ZOFRAN ) injection 4 mg  4 mg Intravenous Q6H PRN Sundil, Subrina, MD   4 mg at 10/11/23 1423   pantoprazole  (PROTONIX ) EC tablet 40 mg  40 mg Oral Daily Amin, Ankit C, MD   40 mg at 10/11/23 1317   polyethylene glycol (MIRALAX  / GLYCOLAX ) packet 17 g  17 g Oral BID Sundil, Subrina, MD   17 g at 10/11/23 1318   senna-docusate (Senokot-S) tablet 1 tablet  1 tablet Oral QHS PRN Amin, Ankit C, MD       sodium chloride  flush (NS) 0.9 % injection 3 mL  3 mL Intravenous Q12H Sundil, Subrina, MD   3 mL at 10/11/23  2126   sodium chloride  flush (NS) 0.9 % injection 3 mL  3 mL Intravenous Q12H Sundil, Subrina, MD   3 mL at 10/11/23 2126   sodium chloride  flush (  NS) 0.9 % injection 3 mL  3 mL Intravenous PRN Sundil, Subrina, MD         Discharge Medications: Please see discharge summary for a list of discharge medications.  Relevant Imaging Results:  Relevant Lab Results:   Additional Information SS# [id number]  Kathryn Parish, RN

## 2023-10-13 ENCOUNTER — Inpatient Hospital Stay (HOSPITAL_COMMUNITY)

## 2023-10-13 DIAGNOSIS — G9341 Metabolic encephalopathy: Secondary | ICD-10-CM | POA: Diagnosis not present

## 2023-10-13 LAB — BASIC METABOLIC PANEL WITH GFR
Anion gap: 6 (ref 5–15)
BUN: 24 mg/dL — ABNORMAL HIGH (ref 8–23)
CO2: 26 mmol/L (ref 22–32)
Calcium: 8.7 mg/dL — ABNORMAL LOW (ref 8.9–10.3)
Chloride: 107 mmol/L (ref 98–111)
Creatinine, Ser: 0.59 mg/dL (ref 0.44–1.00)
GFR, Estimated: >60 mL/min (ref >60)
Glucose, Bld: 98 mg/dL (ref 70–99)
Potassium: 4 mmol/L (ref 3.5–5.1)
Sodium: 139 mmol/L (ref 135–145)

## 2023-10-13 NOTE — Plan of Care (Signed)

## 2023-10-13 NOTE — Progress Notes (Signed)
 PROGRESS NOTE    Jacqueline Foley  WGN:[id number]  DOB: 12-May-1933  DOA: 10/06/2023 PCP: [name], DO Outpatient Specialists:   Hospital course:  89 year old with history of GAD, depression, dementia/memory impairment, left hip fracture status post IM nailing 08/15/2023, CKD 3A, HTN, HLD comes to the ED for evaluation of altered mental status/paranoia and delusion.  Upon admission basic metabolic workup was overall unremarkable besides urinary tract infection.  Started on IV Rocephin .  Blood cultures grew Streptococcus mitis and this was hard to distinguish if this was a contaminant.  Reviewed with ID.  Echocardiogram is negative, surveillance cultures are pending.  No further workup warranted at this time.   Subjective:  Patient seen without family today.  She states she feels fine.  Notes she is hungry.  Has no complaints.   Objective: Vitals:   10/12/23 2029 10/13/23 0411 10/13/23 1050 10/13/23 1330  BP: 118/70 121/77  118/71  Pulse: 89 85  95  Resp: 18 18  17   Temp: 98.6 F (37 C) 98.2 F (36.8 C)  98.2 F (36.8 C)  TempSrc:  Oral  Oral  SpO2: 95% 96% 96% 100%  Weight:      Height:        Intake/Output Summary (Last 24 hours) at 10/13/2023 1739 Last data filed at 10/13/2023 1300 Gross per 24 hour  Intake 480 ml  Output --  Net 480 ml   Filed Weights   10/06/23 2316  Weight: 64 kg     Exam:  General: Patient lying comfortably in bed in good spirits in NAD.   Eyes: sclera anicteric, conjuctiva mild injection bilaterally CVS: S1-S2, regular  Respiratory:  decreased air entry bilaterally secondary to decreased inspiratory effort, rales at bases  GI: NABS, soft, NT  LE: Warm and well-perfused  Data Reviewed:  Basic Metabolic Panel: Recent Labs  Lab 10/08/23 0535 10/09/23 0453 10/10/23 0747 10/11/23 0436 10/12/23 0426 10/13/23 0444  NA 142 141 139 139 139 139  K 3.6 3.6 3.3* 3.8 4.0 4.0  CL 110 107 105 107 109 107  CO2 25 23 25 26 23 26    GLUCOSE 101* 100* 111* 103* 98 98  BUN 16 18 19  28* 28* 24*  CREATININE 0.69 0.77 0.79 0.87 1.02* 0.59  CALCIUM  8.9 9.4 9.1 8.7* 8.8* 8.7*  MG 2.1 2.4 2.3 2.3 2.3  --   PHOS 3.3  --   --   --   --   --     CBC: Recent Labs  Lab 10/06/23 2036 10/07/23 0423 10/08/23 0535 10/09/23 0453 10/10/23 0747 10/11/23 0436 10/12/23 0426  WBC 6.9   < > 5.4 6.5 7.2 6.0 5.1  NEUTROABS 4.2  --   --   --   --   --   --   HGB 11.3*   < > 10.1* 11.2* 11.5* 9.6* 10.0*  HCT 37.2   < > 33.4* 37.7 35.7* 31.6* 33.1*  MCV 92.1   < > 92.3 93.5 89.9 93.2 94.8  PLT 384   < > 297 320 353 270 271   < > = values in this interval not displayed.     Scheduled Meds:  atorvastatin   20 mg Oral Daily   carvedilol  6.25 mg Oral BID WC   enoxaparin  (LOVENOX ) injection  40 mg Subcutaneous Q24H   escitalopram  10 mg Oral Daily   fluticasone   1 spray Each Nare Daily   fluticasone  furoate-vilanterol  1 puff Inhalation Daily   haloperidol  lactate  2 mg Intravenous Once   LORazepam  1 mg Intravenous Once   OLANZapine   7.5 mg Oral QHS   pantoprazole   40 mg Oral Daily   polyethylene glycol  17 g Oral BID   sodium chloride  flush  3 mL Intravenous Q12H   sodium chloride  flush  3 mL Intravenous Q12H   Continuous Infusions:     Assessment & Plan:   Choking episode x 2 New cough Very much appreciate SLP evaluation and recommendations for chopped food Chest x-ray done yesterday shows no evidence of infiltrate or aspiration pneumonia/pneumonitis  Worsening kidney function on CKD 3 AA Creatinine has normalized today at 0.59 with encouraged oral hydration Baseline creatinine is 0.9  Acute cystitis Urine culture positive for pansensitive E. Coli Patient is completed 5-day course of cefazolin , will discontinue  Streptococcus mitis bacteremia Discussed with ID Echocardiogram negative for vegetations Repeat blood cultures are NGTD  HTN Controlled on Coreg Maxide was discontinued  Dementia  Paranoid  behavior/delusions and hallucinations Acute metabolic encephalopathy--resolved Continue Zyprexa , Lexapro and gabapentin  per psych Further workup including CTA, MRI, TSH and B12 are negative  Copied from previous note: Reactive airway disease Stable.  As needed bronchodilators    Left proximal fever intertrochanteric fracture secondary to fall -Status post IM nailing by Dr. Bernard Brick 08/15/2023.   PT/OT     DVT prophylaxis: Lovenox  Code Status: Full code Family Communication: Patient's daughter was at bedside throughout     Studies: DG ESOPHAGUS W SINGLE CM (SOL OR THIN BA) Result Date: 10/13/2023 CLINICAL DATA:  Patient is a 89 y/o female with history of anxiety, depression, memory impairment, CKD, HTN, left hip fracture-s/p IM nailing, and HLD. Patient reports difficulty with dysphagia and "fullness" in the chest. She notes food will get stuck in her throat. EXAM: ESOPHAGUS/BARIUM SWALLOW TECHNIQUE: Single contrast examination was performed using thin liquid barium. This exam was performed by [facility] and was supervised and interpreted by Dr. Ritchie Cheshire. FLUOROSCOPY: Radiation Exposure Index (as provided by the fluoroscopic device): 3.30 mGy Kerma COMPARISON:  None Available. FINDINGS: Swallowing: Evidence of penetration.  No aspiration visualized. Pharynx: Unremarkable. Esophagus: Normal appearance. Esophageal motility: Within normal limits. Hiatal Hernia: None. Gastroesophageal reflux: None visualized. Ingested 13mm barium tablet: Patient unable to swallow tablet. Other: Study limited. Patient was unable to get in proper positions. IMPRESSION: *Single contrast esophagram with evidence of penetration without frank aspiration. Overall limited study due to patient's inability to get in proper position. Electronically Signed   By: Beula Brunswick M.D.   On: 10/13/2023 12:26   DG CHEST PORT 1 VIEW Result Date: 10/12/2023 CLINICAL DATA:  Aspiration. EXAM: PORTABLE CHEST 1 VIEW  COMPARISON:  August 13, 2023 FINDINGS: The heart size and mediastinal contours are within normal limits. Both lungs are clear. The visualized skeletal structures are unremarkable. IMPRESSION: No active disease. Electronically Signed   By: Rosalene Colon M.D.   On: 10/12/2023 13:50    Principal Problem:   Acute metabolic encephalopathy Active Problems:   History of closed left femoral fracture (HCC)   Acute cystitis   Essential hypertension   Hyperlipidemia   Reactive airway disease   Generalized anxiety disorder   History of dementia   CKD (chronic kidney disease), stage III (HCC)   Major depressive disorder, recurrent episode, moderate (HCC)     Dieter Hane Tublu Rowyn Spilde, [facility]  If 7PM-7AM, please contact night-coverage [email or web address]   LOS: 7 days

## 2023-10-13 NOTE — Progress Notes (Signed)
 Speech Language Pathology Treatment: Dysphagia  Patient Details Name: Jacqueline Foley MRN: [id number] DOB: 1933-01-26 Today's Date: 10/13/2023 Time: 4010-2725 SLP Time Calculation (min) (ACUTE ONLY): 20 min  Assessment / Plan / Recommendation Clinical Impression  Pt seen for skilled SLP to address dysphagia goals.  SLP noted esophagram results that showed penetration of liquids - and given pt report of dysphagia, coughing with intake - RMT initiated to improve laryngeal closure/pharyngeal contraction.   RN reports pt tolerated her meals well today without coughing.  No family present at this time.   SLP provided pt with RMT device "EMST 75 lite" clinically set at 5 cm H20 as pt reports issues with dizziness today.  Pt conducted 10 repetitions reporting her effort to be moderate - Use of this apparatus improved phonatory strength - which pt independently noted.    No negative changes in vitals observed during session.  Written instructions left with pt and RN instructed to its usage.   Recommend continue SLP for dysphagia treatment - and for family/pt education re: compensation strategies.  SLP will continue to follow.    HPI HPI: Per OT note, "89 y.o. female adm 5/8 with medical history significant of generalized anxiety disorder with depression, memory impairment/dementia, polio,left femoral intertrochanteric fracture s/p IM nailing repair 08/15/2023, CKD stage IIIa, hypertension, and hyperlipidemia presented to emergency department for mental status change include increased paranoia and delusion.  Just discharged from [facility], with planned [facility] at daughter's home.  Cleared for WBAT."  Swallow eval ordered.  Family reports pt coughs with intake - pt reports this has been chronic.  Pt has had weight loss per her daughter and granddaughter. Pt reports she has gustatory changes and gets full quickly.  Pt is s/p esophagram that showed penetration but no aspiration and normal motility - SLP subsequently  followed up to provide pt with RMT device to improve laryngeal closure, cough, voice and swallowing. RN reports pt tolerating po well.      SLP Plan  Continue with current plan of care      Recommendations for follow up therapy are one component of a multi-disciplinary discharge planning process, led by the attending physician.  Recommendations may be updated based on patient status, additional functional criteria and insurance authorization.    Recommendations  Diet recommendations: Regular;Thin liquid-- Please chop pt's meats for her. Liquids provided via: Cup;Straw Medication Administration: Other (Comment) Supervision: Patient able to self feed;Intermittent supervision to cue for compensatory strategies Compensations: Slow rate;Small sips/bites Postural Changes and/or Swallow Maneuvers: Seated upright 90 degrees;Upright 30-60 min after meal                  Oral care BID     Dysphagia, unspecified (R13.10)     Continue with current plan of care    Maudie Sorrow, MS [facility] SLP Acute Rehab Services Office [phone]  Chantal Comment  10/13/2023, 2:55 PM

## 2023-10-13 NOTE — Plan of Care (Signed)
  Problem: Safety: Goal: Non-violent Restraint(s) Outcome: Progressing   Problem: Skin Integrity: Goal: Risk for impaired skin integrity will decrease Outcome: Progressing   Problem: Safety: Goal: Ability to remain free from injury will improve Outcome: Progressing   Problem: Pain Managment: Goal: General experience of comfort will improve and/or be controlled Outcome: Progressing   Problem: Elimination: Goal: Will not experience complications related to bowel motility Outcome: Progressing Goal: Will not experience complications related to urinary retention Outcome: Progressing   Problem: Coping: Goal: Level of anxiety will decrease Outcome: Progressing   Problem: Nutrition: Goal: Adequate nutrition will be maintained Outcome: Progressing   Problem: Clinical Measurements: Goal: Ability to maintain clinical measurements within normal limits will improve Outcome: Progressing Goal: Will remain free from infection Outcome: Progressing Goal: Diagnostic test results will improve Outcome: Progressing Goal: Respiratory complications will improve Outcome: Progressing Goal: Cardiovascular complication will be avoided Outcome: Progressing   Problem: Health Behavior/Discharge Planning: Goal: Ability to manage health-related needs will improve Outcome: Progressing   Problem: Education: Goal: Knowledge of General Education information will improve Description: Including pain rating scale, medication(s)/side effects and non-pharmacologic comfort measures Outcome: Progressing

## 2023-10-13 NOTE — TOC Progression Note (Addendum)
 Transition of Care [facility]) - Progression Note    Patient Details  Name: Jacqueline Foley MRN: [id number] Date of Birth: Jul 22, 1932  Transition of Care [facility]) CM/SW Contact  Kathryn Parish, RN Phone Number: 10/13/2023, 8:35 AM  Clinical Narrative:    NCM receieved text message from daughter to call first thing this morning. NCM spoke with daughter. Updated on all SNF that I have contracted either via telephone or text message. [facility] - still considering [facility] - dlt said No [facility] - no beds [facility] Daughter also asked about Esophageal studies. NCM informed daughter that procedure was ordered and sure when it will be completed. 9:51 AM NCM informed daughter that [facility] has extended a bed offer.  Expected Discharge Plan: Home w Home Health Services Barriers to Discharge: Continued Medical Work up  Expected Discharge Plan and Services In-house Referral: Chaplain Discharge Planning Services: CM Consult Post Acute Care Choice: NA Living arrangements for the past 2 months: Assisted Living Facility [facility])                 DME Arranged: N/A DME Agency: NA       HH Arranged: NA HH Agency: NA         Social Determinants of Health (SDOH) Interventions SDOH Screenings   Food Insecurity: No Food Insecurity (10/06/2023)  Housing: Low Risk  (10/06/2023)  Transportation Needs: No Transportation Needs (10/06/2023)  Utilities: Not At Risk (10/06/2023)  Social Connections: Socially Isolated (10/06/2023)  Tobacco Use: Low Risk  (10/06/2023)    Readmission Risk Interventions    10/07/2023    2:59 PM 08/14/2023    8:41 AM  Readmission Risk Prevention Plan  Transportation Screening Complete Complete  PCP or Specialist Appt within 5-7 Days Complete Complete  Home Care Screening Complete Complete  Medication Review (RN CM) Complete Complete

## 2023-10-14 DIAGNOSIS — G9341 Metabolic encephalopathy: Secondary | ICD-10-CM | POA: Diagnosis not present

## 2023-10-14 LAB — CULTURE, BLOOD (ROUTINE X 2)
Culture: NO GROWTH
Culture: NO GROWTH

## 2023-10-14 NOTE — Progress Notes (Signed)
 SLP Cancellation Note  Patient Details Name: Maridee Leithead MRN: [id number] DOB: 1932/06/13   Cancelled treatment:       Reason Eval/Treat Not Completed: Other (comment) (RN report pt with good appetitie and no family present at this time; Recommend follow up with SLP at [facility] for dysphagia management and RMST. Thanks so much!) Maudie Sorrow, MS [facility] SLP Acute Rehab Services Office [phone]   Chantal Comment 10/14/2023, 3:25 PM

## 2023-10-14 NOTE — Plan of Care (Signed)

## 2023-10-14 NOTE — Progress Notes (Signed)
 Physical Therapy Treatment Patient Details Name: Jacqueline Foley MRN: [id number] DOB: 1932-10-07 Today's Date: 10/14/2023   History of Present Illness 89 y.o. female adm 5/8 with medical history significant of generalized anxiety disorder with depression, memory impairment/dementia, polio,left femoral intertrochanteric fracture s/p IM nailing repair 08/15/2023, CKD stage IIIa, hypertension, and hyperlipidemia presented to emergency department for mental status change include increased paranoia and delusion.  Just discharged from [facility], with planned [facility] at daughter's home.  Cleared for WBAT.    PT Comments   Pt admitted with above diagnosis.  Pt currently with functional limitations due to the deficits listed below (see PT Problem List). Pt in bed resting when PT arrived. Pt agreeable to therapy intervention. Pt demonstrated increased IND with all functional mobility tasks today. Pt reports no pain, SOB or dizziness. Pt required increased time and CGA with use of hospital bed for supine to sit, A to don shoes and L AFO, CGA for sit to stand  from EOB, cues for safety and proper approach to sitting surfaces for bed, commode and recliner transfers with RW and CGA, gait tasks in personal room short distances up to 25 feet with a focus on RW management and obstacle navigation with CGA and cues, pt exhibits excessive trunk flexion and narrow BOS with shortened stride length and slow cadence. Pt left seated in recliner and all needs in place. Patient will benefit from continued inpatient follow up therapy, <3 hours/day. Pt will benefit from acute skilled PT to increase their independence and safety with mobility to allow discharge.      If plan is discharge home, recommend the following: A little help with walking and/or transfers;Help with stairs or ramp for entrance;Supervision due to cognitive status;Assist for transportation;Direct supervision/assist for financial management;Direct supervision/assist for  medications management;Assistance with cooking/housework;A little help with bathing/dressing/bathroom   Can travel by private vehicle     Yes  Equipment Recommendations  None recommended by PT    Recommendations for Other Services       Precautions / Restrictions Precautions Precautions: Fall Precaution/Restrictions Comments: has recently been fitted for AFO for Left foot, post polio Restrictions Weight Bearing Restrictions Per Provider Order: No     Mobility  Bed Mobility Overal bed mobility: Needs Assistance Bed Mobility: Supine to Sit     Supine to sit: Contact guard, HOB elevated, Used rails     General bed mobility comments: min cues    Transfers Overall transfer level: Needs assistance Equipment used: Rolling walker (2 wheels) Transfers: Sit to/from Stand Sit to Stand: Contact guard assist           General transfer comment: RW with min cues for hand placement and safety for bed, commode and recliner transfers    Ambulation/Gait Ambulation/Gait assistance: Contact guard assist Gait Distance (Feet): 25 Feet Assistive device: Rolling walker (2 wheels) Gait Pattern/deviations: Step-to pattern, Narrow base of support, Trunk flexed Gait velocity: decreased     General Gait Details: excessive trunk flexion, L AFO donned, min cues for safety and obstacle navigation with functional  gait tasks in  personal room- bed to commode and commode to recliner   Stairs             Wheelchair Mobility     Tilt Bed    Modified Rankin (Stroke Patients Only)       Balance Overall balance assessment: Needs assistance, History of Falls Sitting-balance support: Feet supported Sitting balance-Leahy Scale: Good     Standing balance support: Reliant on assistive  device for balance, During functional activity, Bilateral upper extremity supported Standing balance-Leahy Scale: Poor Standing balance comment: Narrow BOS, trunk flexion and B UE support                             Communication Communication Communication: Impaired Factors Affecting Communication: Difficulty expressing self  Cognition Arousal: Alert Behavior During Therapy: WFL for tasks assessed/performed   PT - Cognitive impairments: History of cognitive impairments   Orientation impairments: Person                     Following commands: Impaired Following commands impaired: Only follows one step commands consistently    Cueing Cueing Techniques: Verbal cues, Tactile cues, Visual cues  Exercises      General Comments General comments (skin integrity, edema, etc.): pt on RA and no reports nor signs/symtoms of SOB      Pertinent Vitals/Pain Pain Assessment Pain Assessment: No/denies pain    Home Living                          Prior Function            PT Goals (current goals can now be found in the care plan section) Acute Rehab PT Goals Patient Stated Goal: to  be able to walk alone PT Goal Formulation: With patient/family Time For Goal Achievement: 10/21/23 Potential to Achieve Goals: Fair Progress towards PT goals: Progressing toward goals    Frequency    Min 3X/week      PT Plan      Co-evaluation              AM-PAC PT "6 Clicks" Mobility   Outcome Measure  Help needed turning from your back to your side while in a flat bed without using bedrails?: A Little Help needed moving from lying on your back to sitting on the side of a flat bed without using bedrails?: A Little Help needed moving to and from a bed to a chair (including a wheelchair)?: A Little Help needed standing up from a chair using your arms (e.g., wheelchair or bedside chair)?: A Little Help needed to walk in hospital room?: A Little Help needed climbing 3-5 steps with a railing? : A Lot 6 Click Score: 17    End of Session Equipment Utilized During Treatment: Gait belt Activity Tolerance: Patient tolerated treatment well Patient left:  in chair;with call bell/phone within reach;with chair alarm set Nurse Communication: Mobility status PT Visit Diagnosis: Unsteadiness on feet (R26.81);History of falling (Z91.81);Difficulty in walking, not elsewhere classified (R26.2)     Time: 4098-1191 PT Time Calculation (min) (ACUTE ONLY): 24 min  Charges:    $Gait Training: 8-22 mins $Therapeutic Activity: 8-22 mins PT General Charges $$ ACUTE PT VISIT: 1 Visit                     Cary Clarks, PT Acute Rehab    Jacqueline Foley 10/14/2023, 12:19 PM

## 2023-10-14 NOTE — TOC PASRR Note (Signed)
 30 Day PASRR Note   Patient Details  Name: Jacqueline Foley Date of Birth: 30-Oct-1932   Transition of Care [facility]) CM/SW Contact:    Kathryn Parish, RN Phone Number: 10/14/2023, 2:25 PM  To Whom It May Concern:  Please be advised that this patient will require a short-term nursing home stay - anticipated 30 days or less for rehabilitation and strengthening.   The plan is for return home.

## 2023-10-14 NOTE — TOC Progression Note (Addendum)
 Transition of Care [facility]) - Progression Note    Patient Details  Name: Jacqueline Foley MRN: [id number] Date of Birth: Sep 11, 1932  Transition of Care [facility]) CM/SW Contact  Kathryn Parish, RN Phone Number: 10/14/2023, 8:31 AM  Clinical Narrative:    NCM spoke with daughter. Thet have decided on [facility] for SNF. Still waiting on PASRR. Additional information submitted on 5/15 for Level 2  9:26 AM NCM called [place] must and asked if additional documents were required or if process can be accelerated since patient is medically ready for discharge. Informed that they are behind and will process in order received. 10:23 AM NCM spoke with [facility] w/ [facility]. Informed her that we are waiting on Level 2 PASRR. Sheree said that they will accept patient up to 4:00 PM today and over the weekend. Report will called to [phone], ask for the [address], and patient will go to Rm - 508. 2:33 PM 30 day uploading to [place] [facility]  Expected Discharge Plan: Home w Home Health Services Barriers to Discharge: Continued Medical Work up  Expected Discharge Plan and Services In-house Referral: Chaplain Discharge Planning Services: CM Consult Post Acute Care Choice: NA Living arrangements for the past 2 months: Assisted Living Facility [facility] Living)                 DME Arranged: N/A DME Agency: NA       HH Arranged: NA HH Agency: NA         Social Determinants of Health (SDOH) Interventions SDOH Screenings   Food Insecurity: No Food Insecurity (10/06/2023)  Housing: Low Risk  (10/06/2023)  Transportation Needs: No Transportation Needs (10/06/2023)  Utilities: Not At Risk (10/06/2023)  Social Connections: Socially Isolated (10/06/2023)  Tobacco Use: Low Risk  (10/06/2023)    Readmission Risk Interventions    10/07/2023    2:59 PM 08/14/2023    8:41 AM  Readmission Risk Prevention Plan  Transportation Screening Complete Complete  PCP or Specialist Appt within 5-7 Days Complete  Complete  Home Care Screening Complete Complete  Medication Review (RN CM) Complete Complete

## 2023-10-14 NOTE — Progress Notes (Signed)
 PROGRESS NOTE    Jacqueline Foley  ZOX:[id number]  DOB: 1932/11/22  DOA: 10/06/2023 PCP: [name], DO Outpatient Specialists:   Hospital course:  89 year old with history of GAD, depression, dementia/memory impairment, left hip fracture status post IM nailing 08/15/2023, CKD 3A, HTN, HLD comes to the ED for evaluation of altered mental status/paranoia and delusion.  Upon admission basic metabolic workup was overall unremarkable besides urinary tract infection.  Started on IV Rocephin .  Blood cultures grew Streptococcus mitis and this was hard to distinguish if this was a contaminant.  Reviewed with ID.  Echocardiogram is negative, surveillance cultures are pending.  No further workup warranted at this time.   Subjective:  Patient is in good spirits with no new complaints.  Notes she is no longer choking as much since her food has been chopped up.   Objective: Vitals:   10/14/23 0607 10/14/23 0833 10/14/23 0838 10/14/23 1219  BP: (!) 149/84 122/88 122/88 (!) 129/95  Pulse: 88 82 82 88  Resp: 20  20   Temp: 98.5 F (36.9 C)  98.5 F (36.9 C) 98 F (36.7 C)  TempSrc:      SpO2: 100%  97% 100%  Weight:      Height:        Intake/Output Summary (Last 24 hours) at 10/14/2023 1629 Last data filed at 10/14/2023 0900 Gross per 24 hour  Intake 660 ml  Output --  Net 660 ml   Filed Weights   10/06/23 2316  Weight: 64 kg     Exam:  General: Patient lying comfortably in bed in good spirits in NAD.   Eyes: sclera anicteric, conjuctiva mild injection bilaterally CVS: S1-S2, regular  Respiratory:  decreased air entry bilaterally secondary to decreased inspiratory effort, rales at bases  GI: NABS, soft, NT  LE: Warm and well-perfused  Data Reviewed:  Basic Metabolic Panel: Recent Labs  Lab 10/08/23 0535 10/09/23 0453 10/10/23 0747 10/11/23 0436 10/12/23 0426 10/13/23 0444  NA 142 141 139 139 139 139  K 3.6 3.6 3.3* 3.8 4.0 4.0  CL 110 107 105 107 109 107   CO2 25 23 25 26 23 26   GLUCOSE 101* 100* 111* 103* 98 98  BUN 16 18 19  28* 28* 24*  CREATININE 0.69 0.77 0.79 0.87 1.02* 0.59  CALCIUM  8.9 9.4 9.1 8.7* 8.8* 8.7*  MG 2.1 2.4 2.3 2.3 2.3  --   PHOS 3.3  --   --   --   --   --     CBC: Recent Labs  Lab 10/08/23 0535 10/09/23 0453 10/10/23 0747 10/11/23 0436 10/12/23 0426  WBC 5.4 6.5 7.2 6.0 5.1  HGB 10.1* 11.2* 11.5* 9.6* 10.0*  HCT 33.4* 37.7 35.7* 31.6* 33.1*  MCV 92.3 93.5 89.9 93.2 94.8  PLT 297 320 353 270 271     Scheduled Meds:  atorvastatin   20 mg Oral Daily   carvedilol  6.25 mg Oral BID WC   enoxaparin  (LOVENOX ) injection  40 mg Subcutaneous Q24H   escitalopram  10 mg Oral Daily   fluticasone   1 spray Each Nare Daily   fluticasone  furoate-vilanterol  1 puff Inhalation Daily   haloperidol  lactate  2 mg Intravenous Once   LORazepam  1 mg Intravenous Once   OLANZapine   7.5 mg Oral QHS   pantoprazole   40 mg Oral Daily   polyethylene glycol  17 g Oral BID   sodium chloride  flush  3 mL Intravenous Q12H   sodium chloride  flush  3  mL Intravenous Q12H   Continuous Infusions:     Assessment & Plan:   Patient is now medically stable and ready to transfer to SNF when a bed is available  Choking episode x 2 New cough Very much appreciate SLP evaluation and recommendations for chopped food Chest x-ray done yesterday shows no evidence of infiltrate or aspiration pneumonia/pneumonitis  Worsening kidney function on CKD 3 AA Creatinine has normalized today at 0.59 with encouraged oral hydration Baseline creatinine is 0.9  Acute cystitis Urine culture positive for pansensitive E. Coli Patient is completed 5-day course of cefazolin , will discontinue  Streptococcus mitis bacteremia Discussed with ID Echocardiogram negative for vegetations Repeat blood cultures are NGTD  HTN Controlled on Coreg Maxide was discontinued  Dementia  Paranoid behavior/delusions and hallucinations Acute metabolic  encephalopathy--resolved Continue Zyprexa , Lexapro and gabapentin  per psych Further workup including CTA, MRI, TSH and B12 are negative  Copied from previous note: Reactive airway disease Stable.  As needed bronchodilators    Left proximal fever intertrochanteric fracture secondary to fall -Status post IM nailing by Dr. Bernard Brick 08/15/2023.   PT/OT     DVT prophylaxis: Lovenox  Code Status: Full code Family Communication: Spoke with patient's daughter on the phone.    Studies: DG ESOPHAGUS W SINGLE CM (SOL OR THIN BA) Result Date: 10/13/2023 CLINICAL DATA:  Patient is a 89 y/o female with history of anxiety, depression, memory impairment, CKD, HTN, left hip fracture-s/p IM nailing, and HLD. Patient reports difficulty with dysphagia and "fullness" in the chest. She notes food will get stuck in her throat. EXAM: ESOPHAGUS/BARIUM SWALLOW TECHNIQUE: Single contrast examination was performed using thin liquid barium. This exam was performed by [facility] and was supervised and interpreted by Dr. Ritchie Cheshire. FLUOROSCOPY: Radiation Exposure Index (as provided by the fluoroscopic device): 3.30 mGy Kerma COMPARISON:  None Available. FINDINGS: Swallowing: Evidence of penetration.  No aspiration visualized. Pharynx: Unremarkable. Esophagus: Normal appearance. Esophageal motility: Within normal limits. Hiatal Hernia: None. Gastroesophageal reflux: None visualized. Ingested 13mm barium tablet: Patient unable to swallow tablet. Other: Study limited. Patient was unable to get in proper positions. IMPRESSION: *Single contrast esophagram with evidence of penetration without frank aspiration. Overall limited study due to patient's inability to get in proper position. Electronically Signed   By: Beula Brunswick M.D.   On: 10/13/2023 12:26    Principal Problem:   Acute metabolic encephalopathy Active Problems:   History of closed left femoral fracture (HCC)   Acute cystitis   Essential hypertension    Hyperlipidemia   Reactive airway disease   Generalized anxiety disorder   History of dementia   CKD (chronic kidney disease), stage III (HCC)   Major depressive disorder, recurrent episode, moderate (HCC)     Keawe Marcello Tublu Keonta Monceaux, [facility]  If 7PM-7AM, please contact night-coverage [email or web address]   LOS: 8 days

## 2023-10-15 DIAGNOSIS — G9341 Metabolic encephalopathy: Secondary | ICD-10-CM | POA: Diagnosis not present

## 2023-10-15 NOTE — Progress Notes (Signed)
 PROGRESS NOTE    Jacqueline Foley  BMW:[id number]  DOB: July 11, 1932  DOA: 10/06/2023 PCP: [name], DO Outpatient Specialists:   Hospital course:  As per prior documentation: "89 year old with history of GAD, depression, dementia/memory impairment, left hip fracture status post IM nailing 08/15/2023, CKD 3A, HTN, HLD comes to the ED for evaluation of altered mental status/paranoia and delusion.  Upon admission basic metabolic workup was overall unremarkable besides urinary tract infection.  Started on IV Rocephin .  Blood cultures grew Streptococcus mitis and this was hard to distinguish if this was a contaminant.  Reviewed with ID.  Echocardiogram is negative, surveillance cultures are pending.  No further workup warranted at this time".  10/15/2023: Patient seen.  No new complaints.  Awaiting disposition.  Input from the social worker is highly appreciated.    Subjective: No new complaints.  Objective: Vitals:   10/14/23 2108 10/15/23 0507 10/15/23 0922 10/15/23 1422  BP: 128/65 134/80  134/81  Pulse: 80 79  96  Resp: 18 18  16   Temp: 98 F (36.7 C) 97.6 F (36.4 C)  98.4 F (36.9 C)  TempSrc: Oral Oral    SpO2: 99% 92% 97% 99%  Weight:      Height:        Intake/Output Summary (Last 24 hours) at 10/15/2023 1843 Last data filed at 10/14/2023 2040 Gross per 24 hour  Intake 200 ml  Output --  Net 200 ml   Filed Weights   10/06/23 2316  Weight: 64 kg     Exam:  General: Patient is not in any distress.  Awake and alert.   Eyes: Patient is pale.   CVS: S1-S2  Respiratory: Clear to auscultation.   GI: Soft and nontender.  Data Reviewed:  Basic Metabolic Panel: Recent Labs  Lab 10/09/23 0453 10/10/23 0747 10/11/23 0436 10/12/23 0426 10/13/23 0444  NA 141 139 139 139 139  K 3.6 3.3* 3.8 4.0 4.0  CL 107 105 107 109 107  CO2 23 25 26 23 26   GLUCOSE 100* 111* 103* 98 98  BUN 18 19 28* 28* 24*  CREATININE 0.77 0.79 0.87 1.02* 0.59  CALCIUM  9.4 9.1 8.7*  8.8* 8.7*  MG 2.4 2.3 2.3 2.3  --     CBC: Recent Labs  Lab 10/09/23 0453 10/10/23 0747 10/11/23 0436 10/12/23 0426  WBC 6.5 7.2 6.0 5.1  HGB 11.2* 11.5* 9.6* 10.0*  HCT 37.7 35.7* 31.6* 33.1*  MCV 93.5 89.9 93.2 94.8  PLT 320 353 270 271     Scheduled Meds:  atorvastatin   20 mg Oral Daily   carvedilol   6.25 mg Oral BID WC   enoxaparin  (LOVENOX ) injection  40 mg Subcutaneous Q24H   escitalopram   10 mg Oral Daily   fluticasone   1 spray Each Nare Daily   fluticasone  furoate-vilanterol  1 puff Inhalation Daily   haloperidol  lactate  2 mg Intravenous Once   LORazepam   1 mg Intravenous Once   OLANZapine   7.5 mg Oral QHS   pantoprazole   40 mg Oral Daily   polyethylene glycol  17 g Oral BID   sodium chloride  flush  3 mL Intravenous Q12H   sodium chloride  flush  3 mL Intravenous Q12H   Continuous Infusions:     Assessment & Plan:   Patient is now medically stable and ready to transfer to SNF when a bed is available  Choking episode x 2 New cough -Speech input is highly appreciated.   -Chest x-ray revealed no evidence of infiltrate  or aspiration pneumonia/pneumonitis  Worsening kidney function on CKD 3 AA Creatinine has normalized today at 0.59 with encouraged oral hydration Baseline creatinine is 0.9  Acute cystitis Urine culture positive for pansensitive E. Coli Patient has completed 5-day course of cefazolin , will discontinue  Streptococcus mitis bacteremia Discussed with ID Echocardiogram negative for vegetations Repeat blood cultures are NGTD  HTN Controlled on Coreg  Maxide was discontinued  Dementia  Paranoid behavior/delusions and hallucinations Acute metabolic encephalopathy--resolved Continue Zyprexa , Lexapro  and gabapentin  per psych Further workup including CTA, MRI, TSH and B12 are negative  Reactive airway disease Stable.  As needed bronchodilators    Left proximal fever intertrochanteric fracture secondary to fall -Status post IM nailing  by Dr. Bernard Brick 08/15/2023.   PT/OT   Pursue disposition.  DVT prophylaxis: Lovenox  Code Status: Full code Family Communication:     Studies: No results found.   Principal Problem:   Acute metabolic encephalopathy Active Problems:   Essential hypertension   History of closed left femoral fracture (HCC)   Hyperlipidemia   Reactive airway disease   Acute cystitis   Generalized anxiety disorder   History of dementia   CKD (chronic kidney disease), stage III (HCC)   Major depressive disorder, recurrent episode, moderate (HCC)     Doroteo Gasmen, [facility]  If 7PM-7AM, please contact night-coverage [email or web address]   LOS: 9 days

## 2023-10-15 NOTE — TOC Progression Note (Signed)
 Transition of Care [facility]) - Progression Note    Patient Details  Name: Jacqueline Foley MRN: [id number] Date of Birth: 07-May-1933  [facility]) CM/SW Contact  Katrine Parody, [place] Phone Number: 10/15/2023, 3:08 PM  Clinical Narrative:     CSW received [place] from MD clarifying placement for DC when pt ready. CSW responded that pt will go to different SNF-[facility]- issue Is Level 2 PASSR review - meaning she has to have State approve plan- possible face to face - I will continue to check database-this weekend. If they need a face to face it could be Monday earliest. TOC to continue to follow.  Expected Discharge Plan: Home w Home Health Services Barriers to Discharge: Continued Medical Work up  Expected Discharge Plan and Services In-house Referral: Chaplain Discharge Planning Services: CM Consult Post Acute Care Choice: Skilled Nursing Facility [facility]) Living arrangements for the past 2 months: Assisted Living Facility [facility])                 DME Arranged: N/A DME Agency: NA       HH Arranged: NA HH Agency: NA         Social Determinants of Health (SDOH) Interventions SDOH Screenings   Food Insecurity: No Food Insecurity (10/06/2023)  Housing: Low Risk  (10/06/2023)  Transportation Needs: No Transportation Needs (10/06/2023)  Utilities: Not At Risk (10/06/2023)  Social Connections: Socially Isolated (10/06/2023)  Tobacco Use: Low Risk  (10/06/2023)    Readmission Risk Interventions    10/07/2023    2:59 PM 08/14/2023    8:41 AM  Readmission Risk Prevention Plan  Transportation Screening Complete Complete  PCP or Specialist Appt within 5-7 Days Complete Complete  Home Care Screening Complete Complete  Medication Review (RN CM) Complete Complete

## 2023-10-15 NOTE — Plan of Care (Signed)

## 2023-10-16 DIAGNOSIS — G9341 Metabolic encephalopathy: Secondary | ICD-10-CM | POA: Diagnosis not present

## 2023-10-16 MED ORDER — LISINOPRIL 20 MG PO TABS
20.0000 mg | ORAL_TABLET | Freq: Every day | ORAL | Status: DC
  Administered 2023-10-16 – 2023-10-21 (×6): 20 mg via ORAL
  Filled 2023-10-16 (×7): qty 1

## 2023-10-16 NOTE — Progress Notes (Signed)
 Mobility Specialist - Progress Note   10/16/23 1140  Mobility  Activity Ambulated with assistance in room  Level of Assistance Contact guard assist, steadying assist  Assistive Device Front wheel walker  Distance Ambulated (ft) 30 ft  Range of Motion/Exercises Active  Activity Response Tolerated well  Mobility Referral Yes  Mobility visit 1 Mobility  Mobility Specialist Start Time (ACUTE ONLY) 1125  Mobility Specialist Stop Time (ACUTE ONLY) 1140  Mobility Specialist Time Calculation (min) (ACUTE ONLY) 15 min   Pt was found on recliner chair and agreeable to ambulate. No complaints and at EOS returned to recliner chair with all needs met. Call bell in reach.  Lorna Rose, BS  Mobility Specialist Can be reached via Secure Chat

## 2023-10-16 NOTE — Progress Notes (Signed)
 PROGRESS NOTE    Jacqueline Foley  ZOX:[id number]  DOB: [birth year]  DOA: 10/06/2023 PCP: [name], DO Outpatient Specialists:   Hospital course:  As per prior documentation: "89 year old with history of GAD, depression, dementia/memory impairment, left hip fracture status post IM nailing 08/15/2023, CKD 3A, HTN, HLD comes to the ED for evaluation of altered mental status/paranoia and delusion.  Upon admission basic metabolic workup was overall unremarkable besides urinary tract infection.  Started on IV Rocephin .  Blood cultures grew Streptococcus mitis and this was hard to distinguish if this was a contaminant.  Reviewed with ID.  Echocardiogram is negative, surveillance cultures are pending.  No further workup warranted at this time".  10/16/2023: Patient seen.  No new complaints.  Awaiting disposition.  Input from the social worker is highly appreciated.    Subjective: No new complaints.  Objective: Vitals:   10/15/23 1422 10/15/23 1954 10/16/23 0512 10/16/23 1145  BP: 134/81 (!) 154/79 (!) 141/90 (!) 150/87  Pulse: 96 89 77 78  Resp: 16 18 18 18   Temp: 98.4 F (36.9 C) 98.3 F (36.8 C) 97.7 F (36.5 C) 98 F (36.7 C)  TempSrc:  Oral    SpO2: 99% 100% 100% 100%  Weight:      Height:        Intake/Output Summary (Last 24 hours) at 10/16/2023 1629 Last data filed at 10/16/2023 1300 Gross per 24 hour  Intake 600 ml  Output --  Net 600 ml   Filed Weights   10/06/23 2316  Weight: 64 kg     Exam:  General: Patient is not in any distress.  Awake and alert.   Eyes: Patient is pale.   CVS: S1-S2  Respiratory: Clear to auscultation.   GI: Soft and nontender.  Data Reviewed:  Basic Metabolic Panel: Recent Labs  Lab 10/10/23 0747 10/11/23 0436 10/12/23 0426 10/13/23 0444  NA 139 139 139 139  K 3.3* 3.8 4.0 4.0  CL 105 107 109 107  CO2 25 26 23 26   GLUCOSE 111* 103* 98 98  BUN 19 28* 28* 24*  CREATININE 0.79 0.87 1.02* 0.59  CALCIUM  9.1 8.7* 8.8* 8.7*   MG 2.3 2.3 2.3  --     CBC: Recent Labs  Lab 10/10/23 0747 10/11/23 0436 10/12/23 0426  WBC 7.2 6.0 5.1  HGB 11.5* 9.6* 10.0*  HCT 35.7* 31.6* 33.1*  MCV 89.9 93.2 94.8  PLT 353 270 271     Scheduled Meds:  atorvastatin   20 mg Oral Daily   carvedilol   6.25 mg Oral BID WC   enoxaparin  (LOVENOX ) injection  40 mg Subcutaneous Q24H   escitalopram   10 mg Oral Daily   fluticasone   1 spray Each Nare Daily   fluticasone  furoate-vilanterol  1 puff Inhalation Daily   haloperidol  lactate  2 mg Intravenous Once   LORazepam   1 mg Intravenous Once   OLANZapine   7.5 mg Oral QHS   pantoprazole   40 mg Oral Daily   polyethylene glycol  17 g Oral BID   sodium chloride  flush  3 mL Intravenous Q12H   sodium chloride  flush  3 mL Intravenous Q12H   Continuous Infusions:     Assessment & Plan:   Patient is now medically stable and ready to transfer to SNF when a bed is available  Choking episode x 2 New cough -Speech input is highly appreciated.   -Chest x-ray revealed no evidence of infiltrate or aspiration pneumonia/pneumonitis  Worsening kidney function on CKD 3 AA Creatinine  has normalized today at 0.59 with encouraged oral hydration Baseline creatinine is 0.9  Acute cystitis Urine culture positive for pansensitive E. Coli Patient has completed 5-day course of cefazolin , will discontinue  Streptococcus mitis bacteremia Discussed with ID Echocardiogram negative for vegetations Repeat blood cultures are NGTD  HTN Controlled on Coreg  Maxide was discontinued  Dementia  Paranoid behavior/delusions and hallucinations Acute metabolic encephalopathy--resolved Continue Zyprexa , Lexapro  and gabapentin  per psych Further workup including CTA, MRI, TSH and B12 are negative  Reactive airway disease Stable.  As needed bronchodilators    Left proximal fever intertrochanteric fracture secondary to fall -Status post IM nailing by Dr. Bernard Brick 08/15/2023.   PT/OT   Pursue  disposition.  DVT prophylaxis: Lovenox  Code Status: Full code Family Communication:     Studies: No results found.   Principal Problem:   Acute metabolic encephalopathy Active Problems:   Essential hypertension   History of closed left femoral fracture (HCC)   Hyperlipidemia   Reactive airway disease   Acute cystitis   Generalized anxiety disorder   History of dementia   CKD (chronic kidney disease), stage III (HCC)   Major depressive disorder, recurrent episode, moderate (HCC)     Doroteo Gasmen, [facility]  If 7PM-7AM, please contact night-coverage [email or web address]   LOS: 10 days

## 2023-10-16 NOTE — Plan of Care (Signed)

## 2023-10-17 DIAGNOSIS — G9341 Metabolic encephalopathy: Secondary | ICD-10-CM | POA: Diagnosis not present

## 2023-10-17 NOTE — Progress Notes (Signed)
 PROGRESS NOTE    Jacqueline Foley  ZOX:[id number]  DOB: 14-Feb-1933  DOA: 10/06/2023 PCP: [name], DO Outpatient Specialists:   Hospital course:  As per prior documentation: "89 year old with history of GAD, depression, dementia/memory impairment, left hip fracture status post IM nailing 08/15/2023, CKD 3A, HTN, HLD comes to the ED for evaluation of altered mental status/paranoia and delusion.  Upon admission basic metabolic workup was overall unremarkable besides urinary tract infection.  Started on IV Rocephin .  Blood cultures grew Streptococcus mitis and this was hard to distinguish if this was a contaminant.  Reviewed with ID.  Echocardiogram is negative, surveillance cultures are pending.  No further workup warranted at this time".  10/17/2023: Patient seen.  No new complaints.  Awaiting disposition.  Input from the social worker is highly appreciated.    Subjective: No new complaints.  Objective: Vitals:   10/16/23 1145 10/16/23 1923 10/17/23 0348 10/17/23 1345  BP: (!) 150/87 134/76 (!) 133/94 139/79  Pulse: 78 88 87 78  Resp: 18 18 18 16   Temp: 98 F (36.7 C) 98.2 F (36.8 C) 97.8 F (36.6 C) 97.7 F (36.5 C)  TempSrc:    Oral  SpO2: 100% 100% 99% 96%  Weight:      Height:        Intake/Output Summary (Last 24 hours) at 10/17/2023 1909 Last data filed at 10/17/2023 0945 Gross per 24 hour  Intake 240 ml  Output --  Net 240 ml   Filed Weights   10/06/23 2316  Weight: 64 kg     Exam:  General: Patient is not in any distress.  Awake and alert.   Eyes: Patient is pale.   CVS: S1-S2  Respiratory: Clear to auscultation.   GI: Soft and nontender.  Data Reviewed:  Basic Metabolic Panel: Recent Labs  Lab 10/11/23 0436 10/12/23 0426 10/13/23 0444  NA 139 139 139  K 3.8 4.0 4.0  CL 107 109 107  CO2 26 23 26   GLUCOSE 103* 98 98  BUN 28* 28* 24*  CREATININE 0.87 1.02* 0.59  CALCIUM  8.7* 8.8* 8.7*  MG 2.3 2.3  --     CBC: Recent Labs  Lab  10/11/23 0436 10/12/23 0426  WBC 6.0 5.1  HGB 9.6* 10.0*  HCT 31.6* 33.1*  MCV 93.2 94.8  PLT 270 271     Scheduled Meds:  atorvastatin   20 mg Oral Daily   carvedilol   6.25 mg Oral BID WC   enoxaparin  (LOVENOX ) injection  40 mg Subcutaneous Q24H   escitalopram   10 mg Oral Daily   fluticasone   1 spray Each Nare Daily   fluticasone  furoate-vilanterol  1 puff Inhalation Daily   haloperidol  lactate  2 mg Intravenous Once   lisinopril   20 mg Oral Daily   LORazepam   1 mg Intravenous Once   OLANZapine   7.5 mg Oral QHS   pantoprazole   40 mg Oral Daily   polyethylene glycol  17 g Oral BID   sodium chloride  flush  3 mL Intravenous Q12H   sodium chloride  flush  3 mL Intravenous Q12H   Continuous Infusions:     Assessment & Plan:   Patient is now medically stable and ready to transfer to SNF when a bed is available  Choking episode x 2 New cough -Speech input is highly appreciated.   -Chest x-ray revealed no evidence of infiltrate or aspiration pneumonia/pneumonitis  Worsening kidney function on CKD 3 AA Creatinine has normalized today at 0.59 with encouraged oral hydration Baseline creatinine  is 0.9  Acute cystitis Urine culture positive for pansensitive E. Coli Patient has completed 5-day course of cefazolin , will discontinue  Streptococcus mitis bacteremia Discussed with ID Echocardiogram negative for vegetations Repeat blood cultures are NGTD  HTN Controlled on Coreg  Maxide was discontinued  Dementia  Paranoid behavior/delusions and hallucinations Acute metabolic encephalopathy--resolved Continue Zyprexa , Lexapro  and gabapentin  per psych Further workup including CTA, MRI, TSH and B12 are negative  Reactive airway disease Stable.  As needed bronchodilators    Left proximal fever intertrochanteric fracture secondary to fall -Status post IM nailing by Dr. Bernard Brick 08/15/2023.   PT/OT   Pursue disposition.  DVT prophylaxis: Lovenox  Code Status: Full  code Family Communication:     Studies: No results found.   Principal Problem:   Acute metabolic encephalopathy Active Problems:   Essential hypertension   History of closed left femoral fracture (HCC)   Hyperlipidemia   Reactive airway disease   Acute cystitis   Generalized anxiety disorder   History of dementia   CKD (chronic kidney disease), stage III (HCC)   Major depressive disorder, recurrent episode, moderate (HCC)     Doroteo Gasmen, [facility]  If 7PM-7AM, please contact night-coverage [email or web address]   LOS: 11 days

## 2023-10-17 NOTE — Progress Notes (Signed)
 Occupational Therapy Treatment Patient Details Name: Jacqueline Foley MRN: [id number] DOB: 1933-05-11 Today's Date: 10/17/2023   History of present illness 89 y.o. female adm 5/8 with medical history significant of generalized anxiety disorder with depression, memory impairment/dementia, polio,left femoral intertrochanteric fracture s/p IM nailing repair 08/15/2023, CKD stage IIIa, hypertension, and hyperlipidemia presented to emergency department for mental status change include increased paranoia and delusion.  Just discharged from [facility], with planned [facility] at daughter's home.  Cleared for WBAT.   OT comments  Patient seen for skilled OT sesison this am. Patient requesting to use bathroom and OT worked with patient on RW use for access, falls prevention with balance for ADL progression, standing tolerance sink side for BADL's and safety. See below for current functional level. OT will continue to progress while in Acute setting. Patient will benefit from continued inpatient follow up therapy, <3 hours/day.       If plan is discharge home, recommend the following:  A lot of help with walking and/or transfers;A lot of help with bathing/dressing/bathroom;Assist for transportation;Help with stairs or ramp for entrance;Direct supervision/assist for medications management;Assistance with cooking/housework;Supervision due to cognitive status   Equipment Recommendations  Tub/shower bench       Precautions / Restrictions Precautions Precautions: Fall Restrictions Weight Bearing Restrictions Per Provider Order: No       Mobility Bed Mobility Overal bed mobility: Needs Assistance Bed Mobility: Supine to Sit     Supine to sit: HOB elevated, Used rails, Contact guard     General bed mobility comments: min vc's    Transfers Overall transfer level: Needs assistance Equipment used: Rolling walker (2 wheels) Transfers: Sit to/from Stand, Bed to chair/wheelchair/BSC Sit to Stand: Contact  guard assist     Step pivot transfers: Min assist     General transfer comment: mod cues for hand placement and to stay within RW fram     Balance Overall balance assessment: Needs assistance, History of Falls Sitting-balance support: Feet supported Sitting balance-Leahy Scale: Good     Standing balance support: Reliant on assistive device for balance, During functional activity, Bilateral upper extremity supported Standing balance-Leahy Scale: Poor Standing balance comment: mod cues for widening BOS                           ADL either performed or assessed with clinical judgement   ADL Overall ADL's : Needs assistance/impaired Eating/Feeding: Set up;Sitting   Grooming: Wash/dry hands;Wash/dry face;Standing (worked on standing sink side x 3 trials from recliner with RW with mod cues for upright posture and min A for simple grooming)   Upper Body Bathing: Supervision/ safety;Sitting   Lower Body Bathing: Minimal assistance;Cueing for sequencing;Cueing for safety;Sitting/lateral leans;Sit to/from stand   Upper Body Dressing : Supervision/safety;Sitting   Lower Body Dressing: Moderate assistance;Sitting/lateral leans;Sit to/from stand   Toilet Transfer: Minimal assistance;Rolling walker (2 wheels);BSC/3in1 Toilet Transfer Details (indicate cue type and reason): ambulation to BSC 15 ft x 2 from bed to commode to recliner in room, cues for safety and upright posture Toileting- Clothing Manipulation and Hygiene: Minimal assistance       Functional mobility during ADLs: Minimal assistance;Rolling walker (2 wheels) General ADL Comments: cues for all steps of transfers and ADL's    Extremity/Trunk Assessment Upper Extremity Assessment Upper Extremity Assessment: Overall WFL for tasks assessed   Lower Extremity Assessment Lower Extremity Assessment: Generalized weakness        Vision   Vision Assessment?: No apparent visual  deficits         Communication  Communication Communication: Impaired Factors Affecting Communication: Difficulty expressing self   Cognition Arousal: Alert Behavior During Therapy: WFL for tasks assessed/performed Cognition: Cognition impaired     Awareness: Intellectual awareness impaired, Online awareness impaired Memory impairment (select all impairments): Short-term memory Attention impairment (select first level of impairment): Sustained attention Executive functioning impairment (select all impairments): Reasoning, Problem solving OT - Cognition Comments: patient able to teach back 3/4 posted safety swallowing tips and falls prevention strategies with min cues this visit                 Following commands: Impaired Following commands impaired: Only follows one step commands consistently      Cueing   Cueing Techniques: Verbal cues, Tactile cues, Visual cues        General Comments no SOB or limitations this session    Pertinent Vitals/ Pain       Pain Assessment Pain Assessment: Faces Faces Pain Scale: No hurt   Frequency  Min 2X/week        Progress Toward Goals  OT Goals(current goals can now be found in the care plan section)  Progress towards OT goals: Progressing toward goals  Acute Rehab OT Goals Patient Stated Goal: to continue to improve OT Goal Formulation: With patient Time For Goal Achievement: 10/21/23 Potential to Achieve Goals: Good ADL Goals Pt Will Perform Grooming: with modified independence;standing Pt Will Perform Lower Body Dressing: with modified independence;sit to/from stand Pt Will Transfer to Toilet: with modified independence;regular height toilet;ambulating  Plan      AM-PAC OT "6 Clicks" Daily Activity     Outcome Measure   Help from another person eating meals?: None Help from another person taking care of personal grooming?: A Little Help from another person toileting, which includes using toliet, bedpan, or urinal?: A Lot Help from another  person bathing (including washing, rinsing, drying)?: A Lot Help from another person to put on and taking off regular upper body clothing?: A Little Help from another person to put on and taking off regular lower body clothing?: A Lot 6 Click Score: 16    End of Session Equipment Utilized During Treatment: Gait belt;Rolling walker (2 wheels)  OT Visit Diagnosis: Unsteadiness on feet (R26.81);Muscle weakness (generalized) (M62.81);History of falling (Z91.81);Pain   Activity Tolerance Patient tolerated treatment well   Patient Left in chair;with chair alarm set;with call bell/phone within reach   Nurse Communication Mobility status;Other (comment) (flowsheets for voiding data)        Time: 1610-9604 OT Time Calculation (min): 21 min  Charges: OT General Charges $OT Visit: 1 Visit OT Treatments $Self Care/Home Management : 8-22 mins  Kalon Erhardt OT/L Acute Rehabilitation Department  [phone]  10/17/2023, 12:15 PM

## 2023-10-17 NOTE — Plan of Care (Signed)

## 2023-10-17 NOTE — NC FL2 (Signed)
 [place]  MEDICAID FL2 LEVEL OF CARE FORM     IDENTIFICATION  Patient Name: Jacqueline Foley Birthdate: February 11, 1933 Sex: female Admission Date (Current Location): 10/06/2023  [facility] and [place] Number:  Producer, television/film/video and Address:  [facility],  [address], [place] [address]      Provider Number: [id number]  Attending Physician Name and Address:  Doroteo Gasmen, MD  Relative Name and Phone Number:  Neal,Cheri (Daughter)  [phone] (Mobile    Current Level of Care: Hospital Recommended Level of Care: Skilled Nursing Facility Prior Approval Number:    Date Approved/Denied:   PASRR Number:    Discharge Plan: SNF    Current Diagnoses: Patient Active Problem List   Diagnosis Date Noted   Major depressive disorder, recurrent episode, moderate (HCC) 10/07/2023   Acute cystitis 10/06/2023   Generalized anxiety disorder 10/06/2023   History of dementia 10/06/2023   CKD (chronic kidney disease), stage III (HCC) 10/06/2023   Acute metabolic encephalopathy 10/06/2023   History of closed left femoral fracture (HCC) 08/13/2023   Fall at home, initial encounter 08/13/2023   Acute kidney injury superimposed on CKD (HCC) 08/13/2023   TIA (transient ischemic attack) 08/13/2023   GERD (gastroesophageal reflux disease) 08/13/2023   Hyperlipidemia 08/13/2023   Dementia without behavioral disturbance (HCC) 08/13/2023   Reactive airway disease 08/13/2023   Femur fracture, left (HCC) 08/13/2023   Aortic atherosclerosis (HCC) 03/18/2020   History of chest pain 03/19/2019   Chest pain 11/08/2017   Lumbar radiculopathy 05/02/2017   Precordial chest pain 12/06/2013   Chest pain at rest, negative MI, negative stress test, may have been GI 12/06/2013   Essential hypertension 12/06/2013   DOE (dyspnea on exertion), may be due to HTN 12/06/2013    Orientation RESPIRATION BLADDER Height & Weight     Self, Time, Situation, Place  Normal Continent Weight: 64  kg Height:  5\' 7"  (170.2 cm)  BEHAVIORAL SYMPTOMS/MOOD NEUROLOGICAL BOWEL NUTRITION STATUS      Continent Diet  AMBULATORY STATUS COMMUNICATION OF NEEDS Skin   Limited Assist Verbally Normal                       Personal Care Assistance Level of Assistance              Functional Limitations Info             SPECIAL CARE FACTORS FREQUENCY  PT (By licensed PT), OT (By licensed OT)     PT Frequency: 5x weekly OT Frequency: 5x weekly            Contractures Contractures Info: Not present    Additional Factors Info  Allergies, Psychotropic   Allergies Info: tomato, protonix  Psychotropic Info: zoloftft         Current Medications (10/17/2023):  This is the current hospital active medication list Current Facility-Administered Medications  Medication Dose Route Frequency Provider Last Rate Last Admin   acetaminophen  (TYLENOL ) tablet 650 mg  650 mg Oral Q6H PRN Sundil, Subrina, MD   650 mg at 10/17/23 0403   Or   acetaminophen  (TYLENOL ) suppository 650 mg  650 mg Rectal Q6H PRN Sundil, Subrina, MD       acetaminophen  (TYLENOL ) tablet 325 mg  325 mg Oral BID PRN Amin, Ankit C, MD   325 mg at 10/14/23 0209   atorvastatin  (LIPITOR) tablet 20 mg  20 mg Oral Daily Sundil, Subrina, MD   20 mg at 10/17/23 1026   carvedilol  (  COREG ) tablet 6.25 mg  6.25 mg Oral BID WC Amin, Ankit C, MD   6.25 mg at 10/17/23 1026   dextromethorphan -guaiFENesin  (MUCINEX  DM) 30-600 MG per 12 hr tablet 1 tablet  1 tablet Oral BID PRN Amin, Ankit C, MD   1 tablet at 10/17/23 1026   enoxaparin  (LOVENOX ) injection 40 mg  40 mg Subcutaneous Q24H Ellington, Abby K, RPH   40 mg at 10/17/23 1027   escitalopram  (LEXAPRO ) tablet 10 mg  10 mg Oral Daily Lissa Riding, NP   10 mg at 10/17/23 1026   fluticasone  (FLONASE ) 50 MCG/ACT nasal spray 1 spray  1 spray Each Nare Daily Sundil, Subrina, MD   1 spray at 10/17/23 1043   fluticasone  furoate-vilanterol (BREO ELLIPTA ) 200-25 MCG/ACT 1 puff  1 puff  Inhalation Daily Sundil, Subrina, MD   1 puff at 10/17/23 1324   gabapentin  (NEURONTIN ) capsule 100 mg  100 mg Oral TID PRN Lissa Riding, NP   100 mg at 10/17/23 1026   glucagon  (human recombinant) (GLUCAGEN) injection 1 mg  1 mg Intravenous PRN Amin, Ankit C, MD       haloperidol  lactate (HALDOL ) injection 1 mg  1 mg Intravenous Q6H PRN Sundil, Subrina, MD   1 mg at 10/09/23 2313   haloperidol  lactate (HALDOL ) injection 2 mg  2 mg Intravenous Once Daniels, James K, NP       hydrALAZINE  (APRESOLINE ) injection 10 mg  10 mg Intravenous Q4H PRN Amin, Ankit C, MD   10 mg at 10/09/23 0358   ipratropium-albuterol  (DUONEB) 0.5-2.5 (3) MG/3ML nebulizer solution 3 mL  3 mL Nebulization Q4H PRN Amin, Ankit C, MD       lisinopril  (ZESTRIL ) tablet 20 mg  20 mg Oral Daily Ogbata, Sylvester I, MD   20 mg at 10/17/23 1027   LORazepam  (ATIVAN ) injection 1 mg  1 mg Intravenous Once Amin, Ankit C, MD       methocarbamol  (ROBAXIN ) tablet 500 mg  500 mg Oral Q6H PRN Sundil, Subrina, MD   500 mg at 10/17/23 1027   metoprolol  tartrate (LOPRESSOR ) injection 5 mg  5 mg Intravenous Q4H PRN Amin, Ankit C, MD   5 mg at 10/08/23 1736   OLANZapine  (ZYPREXA ) tablet 7.5 mg  7.5 mg Oral QHS Lissa Riding, NP   7.5 mg at 10/16/23 2118   ondansetron  (ZOFRAN ) tablet 4 mg  4 mg Oral Q6H PRN Sundil, Subrina, MD       Or   ondansetron  (ZOFRAN ) injection 4 mg  4 mg Intravenous Q6H PRN Sundil, Subrina, MD   4 mg at 10/11/23 1423   pantoprazole  (PROTONIX ) EC tablet 40 mg  40 mg Oral Daily Amin, Ankit C, MD   40 mg at 10/17/23 1026   polyethylene glycol (MIRALAX  / GLYCOLAX ) packet 17 g  17 g Oral BID Sundil, Subrina, MD   17 g at 10/17/23 1027   senna-docusate (Senokot-S) tablet 1 tablet  1 tablet Oral QHS PRN Amin, Ankit C, MD       sodium chloride  flush (NS) 0.9 % injection 3 mL  3 mL Intravenous Q12H Sundil, Subrina, MD   3 mL at 10/17/23 1027   sodium chloride  flush (NS) 0.9 % injection 3 mL  3 mL Intravenous Q12H Sundil,  Subrina, MD   3 mL at 10/17/23 1027   sodium chloride  flush (NS) 0.9 % injection 3 mL  3 mL Intravenous PRN Sundil, Subrina, MD         Discharge  Medications: Please see discharge summary for a list of discharge medications.  Relevant Imaging Results:  Relevant Lab Results:   Additional Information SS# [id number]  Kathryn Parish, RN

## 2023-10-17 NOTE — Progress Notes (Signed)
 Speech Language Pathology Treatment: Dysphagia  Patient Details Name: Jacqueline Foley MRN: [id number] DOB: [birth year] Today's Date: 10/17/2023 Time: 8938-1017 SLP Time Calculation (min) (ACUTE ONLY): 15 min  Assessment / Plan / Recommendation Clinical Impression  Patient seen by SLP for skilled treatment focused on dysphagia goals. Patient was awake, alert and sitting up in bed. During discussion of her swallow function, she told SLP that she knows she has difficulty "if I put too much food in my mouth". She endorses current incidents of coughing when eating/drinking as well as h/o of this same problem. As SLP and patient talked, she sipped on water  via straw sips. No overt s/s aspiration observed, however patient did exhibit a brief episode of belching. SLP will continue to follow while admitted and recommending SLP intervention at next venue of care (likely SNF) for RMT and dysphagia treatment.   HPI HPI: Per OT note, "89 y.o. female adm 5/8 with medical history significant of generalized anxiety disorder with depression, memory impairment/dementia, polio,left femoral intertrochanteric fracture s/p IM nailing repair 08/15/2023, CKD stage IIIa, hypertension, and hyperlipidemia presented to emergency department for mental status change include increased paranoia and delusion.  Just discharged from [facility], with planned [facility] at daughter's home.  Cleared for WBAT."  Swallow eval ordered.  Family reports pt coughs with intake - pt reports this has been chronic.  Pt has had weight loss per her daughter and granddaughter. Pt reports she has gustatory changes and gets full quickly.  Pt is s/p esophagram that showed penetration but no aspiration and normal motility - SLP subsequently followed up to provide pt with RMT device to improve laryngeal closure, cough, voice and swallowing. RN reports pt tolerating po well.      SLP Plan  Continue with current plan of care      Recommendations for follow up  therapy are one component of a multi-disciplinary discharge planning process, led by the attending physician.  Recommendations may be updated based on patient status, additional functional criteria and insurance authorization.    Recommendations  Diet recommendations: Regular;Thin liquid Liquids provided via: Cup;Straw Medication Administration: Other (Comment) (as tolerated) Supervision: Patient able to self feed;Intermittent supervision to cue for compensatory strategies Compensations: Slow rate;Small sips/bites Postural Changes and/or Swallow Maneuvers: Seated upright 90 degrees;Upright 30-60 min after meal                  Oral care BID   Intermittent Supervision/Assistance Dysphagia, unspecified (R13.10)     Continue with current plan of care     Jacqualine Mater, MA, CCC-SLP Speech Therapy

## 2023-10-17 NOTE — Progress Notes (Signed)
 Physical Therapy Treatment Patient Details Name: Jacqueline Foley MRN: [id number] DOB: [birth year] Today's Date: 10/17/2023   History of Present Illness 89 y.o. female adm 5/8 with medical history significant of generalized anxiety disorder with depression, memory impairment/dementia, polio,left femoral intertrochanteric fracture s/p IM nailing repair 08/15/2023, CKD stage IIIa, hypertension, and hyperlipidemia presented to emergency department for mental status change include increased paranoia and delusion.  Just discharged from [facility], with planned [facility] at daughter's home.  Cleared for WBAT.    PT Comments   Pt admitted with above diagnosis.  Pt currently with functional limitations due to the deficits listed below (see PT Problem List). Pt seated in recliner when PT arrived. Pt is agreeable to therapy intervention. Pt reported need to void bladder. Pt required A for donning B shoes and L AFO, CGA for sit to stand  from recliner with good recall for proper UE placement, CGA for commode transfers, static standing no UE support with hand washing, CGA for gait tasks in room 20x2 and in hallway 50 feet with trunk flexion, narrow BOS, L AFO donned and slow cadence. Pt left seated in recliner, all needs in place. Patient will benefit from continued inpatient follow up therapy, <3 hours/day. Pt will benefit from acute skilled PT to increase their independence and safety with mobility to allow discharge.      If plan is discharge home, recommend the following: A little help with walking and/or transfers;Help with stairs or ramp for entrance;Supervision due to cognitive status;Assist for transportation;Direct supervision/assist for financial management;Direct supervision/assist for medications management;Assistance with cooking/housework;A little help with bathing/dressing/bathroom   Can travel by private vehicle     Yes  Equipment Recommendations  None recommended by PT    Recommendations for Other  Services       Precautions / Restrictions Precautions Precautions: Fall Precaution/Restrictions Comments: has recently been fitted for AFO for Left foot, post polio Restrictions Weight Bearing Restrictions Per Provider Order: No     Mobility  Bed Mobility               General bed mobility comments: pt seated in recliner when PT arrived    Transfers Overall transfer level: Needs assistance Equipment used: Rolling walker (2 wheels) Transfers: Sit to/from Stand Sit to Stand: Contact guard assist           General transfer comment: min cues for sit to stand from recliner and from commode    Ambulation/Gait Ambulation/Gait assistance: Contact guard assist Gait Distance (Feet): 50 Feet Assistive device: Rolling walker (2 wheels) Gait Pattern/deviations: Step-to pattern, Narrow base of support, Trunk flexed Gait velocity: decreased     General Gait Details: trunk flexion, L AFO donned, min cues for safety aand posture   Stairs             Wheelchair Mobility     Tilt Bed    Modified Rankin (Stroke Patients Only)       Balance Overall balance assessment: Needs assistance, History of Falls Sitting-balance support: Feet supported Sitting balance-Leahy Scale: Good     Standing balance support: Reliant on assistive device for balance, During functional activity, Bilateral upper extremity supported Standing balance-Leahy Scale: Fair Standing balance comment: pt is able to maintain static standing balance no UE support                            Communication Communication Communication: No apparent difficulties Factors Affecting Communication: Difficulty expressing self  Cognition  Arousal: Alert Behavior During Therapy: WFL for tasks assessed/performed   PT - Cognitive impairments: History of cognitive impairments   Orientation impairments: Person, Place, Situation                     Following commands:  Impaired Following commands impaired: Follows multi-step commands inconsistently    Cueing Cueing Techniques: Verbal cues, Tactile cues, Visual cues  Exercises      General Comments General comments (skin integrity, edema, etc.): no SOB or limitations this session      Pertinent Vitals/Pain      Home Living                          Prior Function            PT Goals (current goals can now be found in the care plan section) Acute Rehab PT Goals Patient Stated Goal: to  be able to walk alone PT Goal Formulation: With patient/family Time For Goal Achievement: 10/21/23 Potential to Achieve Goals: Fair Progress towards PT goals: Progressing toward goals    Frequency    Min 3X/week      PT Plan      Co-evaluation              AM-PAC PT "6 Clicks" Mobility   Outcome Measure  Help needed turning from your back to your side while in a flat bed without using bedrails?: A Little Help needed moving from lying on your back to sitting on the side of a flat bed without using bedrails?: A Little Help needed moving to and from a bed to a chair (including a wheelchair)?: A Little Help needed standing up from a chair using your arms (e.g., wheelchair or bedside chair)?: A Little Help needed to walk in hospital room?: A Little Help needed climbing 3-5 steps with a railing? : A Lot 6 Click Score: 17    End of Session Equipment Utilized During Treatment: Gait belt Activity Tolerance: Patient tolerated treatment well Patient left: in chair;with call bell/phone within reach;with chair alarm set Nurse Communication: Mobility status PT Visit Diagnosis: Unsteadiness on feet (R26.81);History of falling (Z91.81);Difficulty in walking, not elsewhere classified (R26.2)     Time: 6063-0160 PT Time Calculation (min) (ACUTE ONLY): 23 min  Charges:    $Gait Training: 8-22 mins $Therapeutic Activity: 8-22 mins PT General Charges $$ ACUTE PT VISIT: 1 Visit                      Jacqueline Foley, PT Acute Rehab    Jacqueline Foley 10/17/2023, 12:47 PM

## 2023-10-18 DIAGNOSIS — G9341 Metabolic encephalopathy: Secondary | ICD-10-CM | POA: Diagnosis not present

## 2023-10-18 DIAGNOSIS — R4182 Altered mental status, unspecified: Secondary | ICD-10-CM

## 2023-10-18 DIAGNOSIS — N3000 Acute cystitis without hematuria: Secondary | ICD-10-CM | POA: Diagnosis not present

## 2023-10-18 DIAGNOSIS — F411 Generalized anxiety disorder: Secondary | ICD-10-CM | POA: Diagnosis not present

## 2023-10-18 LAB — COMPREHENSIVE METABOLIC PANEL WITH GFR
ALT: 18 U/L (ref 0–44)
AST: 23 U/L (ref 15–41)
Albumin: 3.2 g/dL — ABNORMAL LOW (ref 3.5–5.0)
Alkaline Phosphatase: 79 U/L (ref 38–126)
Anion gap: 7 (ref 5–15)
BUN: 22 mg/dL (ref 8–23)
CO2: 27 mmol/L (ref 22–32)
Calcium: 9.1 mg/dL (ref 8.9–10.3)
Chloride: 107 mmol/L (ref 98–111)
Creatinine, Ser: 0.74 mg/dL (ref 0.44–1.00)
GFR, Estimated: >60 mL/min (ref >60)
Glucose, Bld: 100 mg/dL — ABNORMAL HIGH (ref 70–99)
Potassium: 4 mmol/L (ref 3.5–5.1)
Sodium: 141 mmol/L (ref 135–145)
Total Bilirubin: 0.5 mg/dL (ref 0.0–1.2)
Total Protein: 6.4 g/dL — ABNORMAL LOW (ref 6.5–8.1)

## 2023-10-18 LAB — CBC
HCT: 31.3 % — ABNORMAL LOW (ref 36.0–46.0)
Hemoglobin: 9.7 g/dL — ABNORMAL LOW (ref 12.0–15.0)
MCH: 28.4 pg (ref 26.0–34.0)
MCHC: 31 g/dL (ref 30.0–36.0)
MCV: 91.8 fL (ref 80.0–100.0)
Platelets: 267 10*3/uL (ref 150–400)
RBC: 3.41 MIL/uL — ABNORMAL LOW (ref 3.87–5.11)
RDW: 13.9 % (ref 11.5–15.5)
WBC: 4.9 10*3/uL (ref 4.0–10.5)
nRBC: 0 % (ref 0.0–0.2)

## 2023-10-18 NOTE — Progress Notes (Signed)
 [facility]                                                                              Jacqueline Foley, is a 89 y.o. female, DOB - Sep 30, 1932, [id number] Admit date - 10/06/2023    Outpatient Primary MD for the patient is Jacqueline April, DO  LOS - 12  days  No chief complaint on file.      Brief summary   Patient is a 89 year old female with anxiety, depression, dementia, left hip fracture status post IM nailing 08/15/2023, CKD 3a, HTN, HLD comes to the ED for evaluation of altered mental status/paranoia and delusion.  Patient was found to have UTI, placed on IV Rocephin .   Blood cultures grew Streptococcus mitis and this was hard to distinguish if this was a contaminant. Reviewed with ID. Echocardiogram is negative, no further workup Awaiting placement  Assessment & Plan    Choking episode x 2, New cough - SLP evaluation completed, recommended regular diet with thin liquids, no acute aspiration  -Chest x-ray revealed no evidence of infiltrate or aspiration pneumonia/pneumonitis   Mild acute kidney injury superimposed on CKD stage IIIa  Baseline creatinine 0.9, - Creatinine trended up to 1.02 on 5/14, now normalized.   Acute UTI -Urine culture showed pansensitive E. coli -  has completed 5-day course of cefazolin    Streptococcus mitis bacteremia Discussed with ID Dr Noelle Batten discussed with ID, 2D echo negative for any vegetations.   Repeat blood cultures negative to date, no further workup   HTN -Stable, continue Coreg   Maxide was discontinued   Dementia  Paranoid behavior/delusions and hallucinations Acute metabolic encephalopathy--resolved Continue Zyprexa , Lexapro  and gabapentin  per psych Further workup including CTA, MRI, TSH and B12 are negative   Reactive airway disease Stable.  As needed bronchodilators    Left proximal fever intertrochanteric fracture secondary to fall -Status post IM nailing by Dr. Bernard Brick 08/15/2023.   -PT/OT,  awaiting SNF    Estimated body mass index is 22.1 kg/m as calculated from the following:   Height as of this encounter: 5\' 7"  (1.702 m).   Weight as of this encounter: 64 kg.  Code Status: Full code DVT Prophylaxis:  enoxaparin  (LOVENOX ) injection 40 mg Start: 10/09/23 1000 SCDs Start: 10/06/23 2251 Place TED hose Start: 10/06/23 2251   Level of Care: Level of care: Med-Surg Family Communication: Updated patient' Disposition Plan:      Remains inpatient appropriate: Awaiting SNF   Procedures:    Consultants:     Antimicrobials:   Anti-infectives (From admission, onward)    Start     Dose/Rate Route Frequency Ordered Stop   10/08/23 1400  ceFAZolin  (ANCEF ) IVPB 2g/100 mL premix  Status:  Discontinued        2 g 200 mL/hr over 30 Minutes Intravenous Every 8 hours 10/08/23 1228 10/12/23 1517   10/07/23 2300  cefTRIAXone  (ROCEPHIN ) 1 g in sodium chloride  0.9 % 100 mL IVPB  Status:  Discontinued        1 g 200 mL/hr over 30 Minutes Intravenous Every 24 hours 10/06/23 2250 10/08/23 1228   10/06/23 2230  cefTRIAXone  (ROCEPHIN ) 2 g in  sodium chloride  0.9 % 100 mL IVPB        2 g 200 mL/hr over 30 Minutes Intravenous  Once 10/06/23 2226 10/07/23 0006          Medications  atorvastatin   20 mg Oral Daily   carvedilol   6.25 mg Oral BID WC   enoxaparin  (LOVENOX ) injection  40 mg Subcutaneous Q24H   escitalopram   10 mg Oral Daily   fluticasone   1 spray Each Nare Daily   fluticasone  furoate-vilanterol  1 puff Inhalation Daily   haloperidol  lactate  2 mg Intravenous Once   lisinopril   20 mg Oral Daily   LORazepam   1 mg Intravenous Once   OLANZapine   7.5 mg Oral QHS   pantoprazole   40 mg Oral Daily   polyethylene glycol  17 g Oral BID   sodium chloride  flush  3 mL Intravenous Q12H   sodium chloride  flush  3 mL Intravenous Q12H      Subjective:   Jacqueline Foley was seen and examined today.  No new complaints, patient denies dizziness, chest pain, shortness of  breath, abdominal pain, N/V.  No fevers. Objective:   Vitals:   10/18/23 0427 10/18/23 0943 10/18/23 0945 10/18/23 1220  BP: (!) 161/86   127/75  Pulse: 82   92  Resp: 20   18  Temp: 97.6 F (36.4 C)   97.9 F (36.6 C)  TempSrc:    Oral  SpO2: 98% 99% 99% 98%  Weight:      Height:        Intake/Output Summary (Last 24 hours) at 10/18/2023 1557 Last data filed at 10/18/2023 1230 Gross per 24 hour  Intake 480 ml  Output --  Net 480 ml     Wt Readings from Last 3 Encounters:  10/06/23 64 kg  08/15/23 64 kg  07/19/23 61.7 kg     Exam General: Alert and oriented, comfortable Cardiovascular: S1 S2 auscultated,  RRR Respiratory: dec BS at bases is Gastrointestinal: Soft, nontender, nondistended, + bowel sounds Ext: no pedal edema bilaterally Neuro: no new deficit's Psych: Normal affect     Data Reviewed:  I have personally reviewed following labs    CBC Lab Results  Component Value Date   WBC 4.9 10/18/2023   RBC 3.41 (L) 10/18/2023   HGB 9.7 (L) 10/18/2023   HCT 31.3 (L) 10/18/2023   MCV 91.8 10/18/2023   MCH 28.4 10/18/2023   PLT 267 10/18/2023   MCHC 31.0 10/18/2023   RDW 13.9 10/18/2023   LYMPHSABS 2.0 10/06/2023   MONOABS 0.5 10/06/2023   EOSABS 0.2 10/06/2023   BASOSABS 0.1 10/06/2023     Last metabolic panel Lab Results  Component Value Date   NA 141 10/18/2023   K 4.0 10/18/2023   CL 107 10/18/2023   CO2 27 10/18/2023   BUN 22 10/18/2023   CREATININE 0.74 10/18/2023   GLUCOSE 100 (H) 10/18/2023   GFRNONAA >60 10/18/2023   GFRAA >60 11/09/2017   CALCIUM  9.1 10/18/2023   PHOS 3.3 10/08/2023   PROT 6.4 (L) 10/18/2023   ALBUMIN 3.2 (L) 10/18/2023   LABGLOB 2.8 12/01/2022   BILITOT 0.5 10/18/2023   ALKPHOS 79 10/18/2023   AST 23 10/18/2023   ALT 18 10/18/2023   ANIONGAP 7 10/18/2023    CBG (last 3)  No results for input(s): "GLUCAP" in the last 72 hours.    Coagulation Profile: No results for input(s): "INR", "PROTIME" in the  last 168 hours.   Radiology Studies: I have  personally reviewed the imaging studies  No results found.     Bertram Brocks M.D. [facility] 10/18/2023, 3:57 PM  Available via Epic secure chat 7am-7pm After 7 pm, please refer to night coverage provider listed on amion.

## 2023-10-18 NOTE — Plan of Care (Signed)
  Problem: Education: Goal: Knowledge of General Education information will improve Description: Including pain rating scale, medication(s)/side effects and non-pharmacologic comfort measures Outcome: Progressing   Problem: Clinical Measurements: Goal: Will remain free from infection Outcome: Progressing   Problem: Elimination: Goal: Will not experience complications related to bowel motility Outcome: Progressing Goal: Will not experience complications related to urinary retention Outcome: Progressing   Problem: Safety: Goal: Ability to remain free from injury will improve Outcome: Progressing   Problem: Skin Integrity: Goal: Risk for impaired skin integrity will decrease Outcome: Progressing   

## 2023-10-18 NOTE — Plan of Care (Signed)
  Problem: Clinical Measurements: Goal: Ability to maintain clinical measurements within normal limits will improve Outcome: Adequate for Discharge Goal: Will remain free from infection Outcome: Adequate for Discharge Goal: Diagnostic test results will improve Outcome: Adequate for Discharge Goal: Cardiovascular complication will be avoided Outcome: Adequate for Discharge   

## 2023-10-19 DIAGNOSIS — N3000 Acute cystitis without hematuria: Secondary | ICD-10-CM | POA: Diagnosis not present

## 2023-10-19 DIAGNOSIS — R4182 Altered mental status, unspecified: Secondary | ICD-10-CM | POA: Diagnosis not present

## 2023-10-19 DIAGNOSIS — G9341 Metabolic encephalopathy: Secondary | ICD-10-CM | POA: Diagnosis not present

## 2023-10-19 NOTE — Progress Notes (Signed)
 Speech Language Pathology Treatment: Dysphagia  Patient Details Name: Jacqueline Foley MRN: [id number] DOB: 04/14/1933 Today's Date: 10/19/2023 Time: 4098-1191 SLP Time Calculation (min) (ACUTE ONLY): 20 min  Assessment / Plan / Recommendation Clinical Impression  Pt seen for dysphagia management.  She was alert in bed and reports she does not like her sodium free diet and states she has choked several times while eating.    Rn reports pt tolerating po medication.      SLP observed pt with her RMT - EMST with pt benefiting from max cues to understand exhalation into device- fading to minimal.  No family present during session.      Pt's lunch tray - raw fruit with no liquids arrived - - SLP has previously given pt Fixodent but did not locate it in the room - therefore again provided pt with Fixodent, cleaned dentures and placed adhesive on them.  Pt then placed them.   Observed pt consuming her lunch tray including cantelope, strawberry and grapes- as well as Ensure.  Pt takes large boluses, appeared with effortful swallow and began coughing - pointing to pharynx stating "It's stuck".  SLP instructed pt to expectorate - which she effectively conducted. Extra boluses were followed by coughing as well.  SLP then threw away cantelope and applesauce - with pt demonstrating no clinical indication of dysphagia with strawberries and grapes *she is expectorating skin from the grape. Relayed info to RN.    Using teach back, pt able to articulate precautions but does not generalize them and is rather impulsive - therefore intermittent supervision recommended to continue.   Recommend follow up SLP at next venue of care for EMST and dysphagia management, especially in light of pt being a FULL CODE with cognitive deficits.      HPI HPI: Per OT note, "89 y.o. female adm 5/8 with medical history significant of generalized anxiety disorder with depression, memory impairment/dementia, polio,left femoral  intertrochanteric fracture s/p IM nailing repair 08/15/2023, CKD stage IIIa, hypertension, and hyperlipidemia presented to emergency department for mental status change include increased paranoia and delusion.  Just discharged from [facility], with planned [facility] at daughter's home.  Cleared for WBAT."  Swallow eval ordered.  Family reports pt coughs with intake - pt reports this has been chronic.  Pt has had weight loss per her daughter and granddaughter. Pt reports she has gustatory changes and gets full quickly.  Pt is s/p esophagram that showed penetration but no aspiration and normal motility - SLP subsequently followed up to provide pt with RMT device to improve laryngeal closure, cough, voice and swallowing. RN reports pt tolerating po well.      SLP Plan  Continue with current plan of care      Recommendations for follow up therapy are one component of a multi-disciplinary discharge planning process, led by the attending physician.  Recommendations may be updated based on patient status, additional functional criteria and insurance authorization.    Recommendations  Diet recommendations: Dysphagia 3 (mechanical soft);Thin liquid Liquids provided via: Cup;Straw Medication Administration: Crushed with puree Supervision: Trained caregiver to feed patient Compensations: Slow rate;Small sips/bites;Follow solids with liquid Postural Changes and/or Swallow Maneuvers: Seated upright 90 degrees;Upright 30-60 min after meal                  Oral care BID   Frequent or constant Supervision/Assistance Dysphagia, unspecified (R13.10)     Continue with current plan of care    Maudie Sorrow, MS [facility] SLP Acute Rehab  Services Office [phone]  Chantal Comment  10/19/2023, 1:01 PM

## 2023-10-19 NOTE — Plan of Care (Signed)

## 2023-10-19 NOTE — TOC Progression Note (Signed)
 Transition of Care [facility]) - Progression Note    Patient Details  Name: Jerianne Anselmo MRN: [id number] Date of Birth: [birth year]  [facility]) CM/SW Contact  Kathryn Parish, RN Phone Number: 10/19/2023, 9:02 AM  Clinical Narrative:    NCM spoke with Theone Fitting [place] dept of Health and Human Services ([facility]AS[facility]) due to Dx of anxiety, dementia and major depressive disorder an in person interview has to be completed.  MUST ID#      Type                                         Status [id number] PASARR Only Review QMHP Addr Conf 2023-10-11 12:53:23.0   Expected Discharge Plan: Home w Home Health Services Barriers to Discharge: Continued Medical Work up  Expected Discharge Plan and Services In-house Referral: Chaplain Discharge Planning Services: CM Consult Post Acute Care Choice: Skilled Nursing Facility [facility]) Living arrangements for the past 2 months: Assisted Living Facility [facility] Living)                 DME Arranged: N/A DME Agency: NA       HH Arranged: NA HH Agency: NA         Social Determinants of Health (SDOH) Interventions SDOH Screenings   Food Insecurity: No Food Insecurity (10/06/2023)  Housing: Low Risk  (10/06/2023)  Transportation Needs: No Transportation Needs (10/06/2023)  Utilities: Not At Risk (10/06/2023)  Social Connections: Socially Isolated (10/06/2023)  Tobacco Use: Low Risk  (10/06/2023)    Readmission Risk Interventions    10/07/2023    2:59 PM 08/14/2023    8:41 AM  Readmission Risk Prevention Plan  Transportation Screening Complete Complete  PCP or Specialist Appt within 5-7 Days Complete Complete  Home Care Screening Complete Complete  Medication Review (RN CM) Complete Complete

## 2023-10-19 NOTE — Progress Notes (Signed)
 [facility]                                                                              Jacqueline Foley, is a 89 y.o. female, DOB - 31-Jul-1932, ZOX:[id number] Admit date - 10/06/2023    Outpatient Primary MD for the patient is [name], DO  LOS - 13  days  No chief complaint on file.      Brief summary   Patient is a 89 year old female with anxiety, depression, dementia, left hip fracture status post IM nailing 08/15/2023, CKD 3a, HTN, HLD comes to the ED for evaluation of altered mental status/paranoia and delusion.  Patient was found to have UTI, placed on IV Rocephin .   Blood cultures grew Streptococcus mitis and this was hard to distinguish if this was a contaminant. Reviewed with ID. Echocardiogram is negative, no further workup Awaiting placement  Assessment & Plan    Choking episode x 2, New cough - SLP evaluation completed, recommended regular diet with thin liquids, no acute aspiration  -Chest x-ray revealed no evidence of infiltrate or aspiration pneumonia/pneumonitis - Tolerating diet   Mild acute kidney injury superimposed on CKD stage IIIa  Baseline creatinine 0.9, - Creatinine trended up to 1.02 on 5/14 - Creatinine now improved, at baseline   Acute UTI -Urine culture showed pansensitive E. coli -  has completed 5-day course of cefazolin    Streptococcus mitis bacteremia Discussed with ID Dr Noelle Batten discussed with ID, 2D echo negative for any vegetations.   Repeat blood cultures negative to date, no further workup   HTN -continue Coreg   -Triamterene  HCTZ discontinued   Dementia  Paranoid behavior/delusions and hallucinations Acute metabolic encephalopathy--resolved Continue Zyprexa , Lexapro  and gabapentin  per psych Further workup including CTA, MRI, TSH and B12 are negative   Reactive airway disease Stable.  As needed bronchodilators    Left proximal fever intertrochanteric fracture secondary to fall -Status post IM nailing  by Dr. Bernard Brick 08/15/2023.   -PT/OT, awaiting SNF    Estimated body mass index is 22.1 kg/m as calculated from the following:   Height as of this encounter: 5\' 7"  (1.702 m).   Weight as of this encounter: 64 kg.  Code Status: Full code DVT Prophylaxis:  enoxaparin  (LOVENOX ) injection 40 mg Start: 10/09/23 1000 SCDs Start: 10/06/23 2251 Place TED hose Start: 10/06/23 2251   Level of Care: Level of care: Med-Surg Family Communication: Updated patient' Disposition Plan:      Remains inpatient appropriate: Awaiting SNF, medically stable   Procedures:    Consultants:     Antimicrobials:   Anti-infectives (From admission, onward)    Start     Dose/Rate Route Frequency Ordered Stop   10/08/23 1400  ceFAZolin  (ANCEF ) IVPB 2g/100 mL premix  Status:  Discontinued        2 g 200 mL/hr over 30 Minutes Intravenous Every 8 hours 10/08/23 1228 10/12/23 1517   10/07/23 2300  cefTRIAXone  (ROCEPHIN ) 1 g in sodium chloride  0.9 % 100 mL IVPB  Status:  Discontinued        1 g 200 mL/hr over 30 Minutes Intravenous Every 24 hours 10/06/23 2250 10/08/23 1228  10/06/23 2230  cefTRIAXone  (ROCEPHIN ) 2 g in sodium chloride  0.9 % 100 mL IVPB        2 g 200 mL/hr over 30 Minutes Intravenous  Once 10/06/23 2226 10/07/23 0006          Medications  atorvastatin   20 mg Oral Daily   carvedilol   6.25 mg Oral BID WC   enoxaparin  (LOVENOX ) injection  40 mg Subcutaneous Q24H   escitalopram   10 mg Oral Daily   fluticasone   1 spray Each Nare Daily   fluticasone  furoate-vilanterol  1 puff Inhalation Daily   haloperidol  lactate  2 mg Intravenous Once   lisinopril   20 mg Oral Daily   LORazepam   1 mg Intravenous Once   OLANZapine   7.5 mg Oral QHS   pantoprazole   40 mg Oral Daily   polyethylene glycol  17 g Oral BID   sodium chloride  flush  3 mL Intravenous Q12H   sodium chloride  flush  3 mL Intravenous Q12H      Subjective:   Jacqueline Foley was seen and examined today.  No acute complaints,  eating breakfast at the time of my encounter.  Awaiting placement.  No chest pain, shortness of breath, fever chills nausea vomiting.    Objective:   Vitals:   10/19/23 0344 10/19/23 0923 10/19/23 0955 10/19/23 1203  BP: (!) 161/103 116/75  (!) 147/93  Pulse: 81 95  99  Resp: 18   16  Temp: 98.2 F (36.8 C)   98.4 F (36.9 C)  TempSrc: Oral   Oral  SpO2: 99%  98% 95%  Weight:      Height:        Intake/Output Summary (Last 24 hours) at 10/19/2023 1321 Last data filed at 10/19/2023 0945 Gross per 24 hour  Intake 603 ml  Output --  Net 603 ml     Wt Readings from Last 3 Encounters:  10/06/23 64 kg  08/15/23 64 kg  07/19/23 61.7 kg   Physical Exam General: Alert and oriente, NAD Cardiovascular: S1 S2 clear, RRR.  Respiratory: CTAB, no wheezing Gastrointestinal: Soft, nontender, nondistended, NBS Ext: no pedal edema bilaterally Neuro: no new deficits psych: pleasant     Data Reviewed:  I have personally reviewed following labs    CBC Lab Results  Component Value Date   WBC 4.9 10/18/2023   RBC 3.41 (L) 10/18/2023   HGB 9.7 (L) 10/18/2023   HCT 31.3 (L) 10/18/2023   MCV 91.8 10/18/2023   MCH 28.4 10/18/2023   PLT 267 10/18/2023   MCHC 31.0 10/18/2023   RDW 13.9 10/18/2023   LYMPHSABS 2.0 10/06/2023   MONOABS 0.5 10/06/2023   EOSABS 0.2 10/06/2023   BASOSABS 0.1 10/06/2023     Last metabolic panel Lab Results  Component Value Date   NA 141 10/18/2023   K 4.0 10/18/2023   CL 107 10/18/2023   CO2 27 10/18/2023   BUN 22 10/18/2023   CREATININE 0.74 10/18/2023   GLUCOSE 100 (H) 10/18/2023   GFRNONAA >60 10/18/2023   GFRAA >60 11/09/2017   CALCIUM  9.1 10/18/2023   PHOS 3.3 10/08/2023   PROT 6.4 (L) 10/18/2023   ALBUMIN 3.2 (L) 10/18/2023   LABGLOB 2.8 12/01/2022   BILITOT 0.5 10/18/2023   ALKPHOS 79 10/18/2023   AST 23 10/18/2023   ALT 18 10/18/2023   ANIONGAP 7 10/18/2023    CBG (last 3)  No results for input(s): "GLUCAP" in the last 72  hours.    Coagulation Profile: No results for  input(s): "INR", "PROTIME" in the last 168 hours.   Radiology Studies: I have personally reviewed the imaging studies  No results found.     Bertram Brocks M.D. [facility] 10/19/2023, 1:21 PM  Available via Epic secure chat 7am-7pm After 7 pm, please refer to night coverage provider listed on amion.

## 2023-10-20 ENCOUNTER — Telehealth: Payer: Self-pay | Admitting: Neurology

## 2023-10-20 DIAGNOSIS — G9341 Metabolic encephalopathy: Secondary | ICD-10-CM | POA: Diagnosis not present

## 2023-10-20 DIAGNOSIS — R4182 Altered mental status, unspecified: Secondary | ICD-10-CM | POA: Diagnosis not present

## 2023-10-20 DIAGNOSIS — N183 Chronic kidney disease, stage 3 unspecified: Secondary | ICD-10-CM | POA: Diagnosis not present

## 2023-10-20 DIAGNOSIS — N3000 Acute cystitis without hematuria: Secondary | ICD-10-CM | POA: Diagnosis not present

## 2023-10-20 NOTE — Progress Notes (Signed)
 Physical Therapy Treatment Patient Details Name: Jacqueline Foley MRN: [id number] DOB: 04/18/1933 Today's Date: 10/20/2023   History of Present Illness 89 y.o. female adm 5/8 with medical history significant of generalized anxiety disorder with depression, memory impairment/dementia, polio,left femoral intertrochanteric fracture s/p IM nailing repair 08/15/2023, CKD stage IIIa, hypertension, and hyperlipidemia presented to emergency department for mental status change include increased paranoia and delusion.  Just discharged from [facility], with planned [facility] at daughter's home.  Cleared for WBAT.    PT Comments   The patient is very pleasant, follows instructions well.  Patient ambulated 2' with mod support, trunk flexed, then ambulated 47' x2 using RW with much improved posture and sequence. Patient noted to fatigue near end of   second walk. Patient will benefit from continued inpatient follow up therapy, <3 hours/day.    If plan is discharge home, recommend the following: A little help with walking and/or transfers;Help with stairs or ramp for entrance;Supervision due to cognitive status;Assist for transportation;Direct supervision/assist for financial management;Direct supervision/assist for medications management;Assistance with cooking/housework;A little help with bathing/dressing/bathroom   Can travel by private vehicle     Yes  Equipment Recommendations  None recommended by PT    Recommendations for Other Services       Precautions / Restrictions Precautions Precautions: Fall Precaution/Restrictions Comments: has recently been fitted for AFO for Left foot, post polio, also has sneakers whiich work well Restrictions Weight Bearing Restrictions Per Provider Order: No Other Position/Activity Restrictions: use sketchers     Mobility  Bed Mobility Overal bed mobility: Needs Assistance Bed Mobility: Supine to Sit     Supine to sit: HOB elevated, Used rails, Contact guard           Transfers Overall transfer level: Needs assistance Equipment used: Rolling walker (2 wheels) Transfers: Sit to/from Stand Sit to Stand: Mod assist     Squat pivot transfers: Mod assist     General transfer comment: patient leaning forward, able to reach armrest and pivot to [facility], mod support to stand from [facility], then CGA from recliner  x1 and min  x1 as fatigueing    Ambulation/Gait Ambulation/Gait assistance: Min assist Gait Distance (Feet): 80 Feet (x 2) Assistive device: Rolling walker (2 wheels) Gait Pattern/deviations: Step-to pattern, Narrow base of support, Trunk flexed Gait velocity: decreased     General Gait Details: first 61' postrue very erect, second noted more flexed with fatigue   Stairs             Wheelchair Mobility     Tilt Bed    Modified Rankin (Stroke Patients Only)       Balance Overall balance assessment: Needs assistance, History of Falls Sitting-balance support: Feet supported Sitting balance-Leahy Scale: Good     Standing balance support: Reliant on assistive device for balance, During functional activity, Bilateral upper extremity supported Standing balance-Leahy Scale: Fair Standing balance comment: pt is able to maintain static standing balance no UE support to apply her wig                            Communication Communication Communication: Impaired Factors Affecting Communication: Reduced clarity of speech  Cognition Arousal: Alert Behavior During Therapy: WFL for tasks assessed/performed   PT - Cognitive impairments: History of cognitive impairments                       PT - Cognition Comments: patient  more cogniznat, knows  fractured hip, wants to knpw when she will leave   Following commands impaired: Only follows one step commands consistently, Follows one step commands with increased time    Cueing Cueing Techniques: Verbal cues  Exercises      General Comments        Pertinent  Vitals/Pain Pain Assessment Pain Assessment: (P) Faces Faces Pain Scale: (P) Hurts little more Pain Location: left  thigh Pain Descriptors / Indicators: Discomfort, Aching    Home Living                          Prior Function            PT Goals (current goals can now be found in the care plan section) Acute Rehab PT Goals Time For Goal Achievement: 11/03/23 Potential to Achieve Goals: Good Progress towards PT goals: Progressing toward goals    Frequency    Min 2X/week      PT Plan      Co-evaluation              AM-PAC PT "6 Clicks" Mobility   Outcome Measure  Help needed turning from your back to your side while in a flat bed without using bedrails?: A Little Help needed moving from lying on your back to sitting on the side of a flat bed without using bedrails?: A Little Help needed moving to and from a bed to a chair (including a wheelchair)?: A Little Help needed standing up from a chair using your arms (e.g., wheelchair or bedside chair)?: A Little Help needed to walk in hospital room?: A Little Help needed climbing 3-5 steps with a railing? : A Lot 6 Click Score: 17    End of Session Equipment Utilized During Treatment: Gait belt Activity Tolerance: Patient tolerated treatment well Patient left: in chair;with call bell/phone within reach;with chair alarm set Nurse Communication: Mobility status PT Visit Diagnosis: Unsteadiness on feet (R26.81);History of falling (Z91.81);Difficulty in walking, not elsewhere classified (R26.2)     Time: 6578-4696 PT Time Calculation (min) (ACUTE ONLY): 38 min  Charges:    $Gait Training: 23-37 mins $Self Care/Home Management: 8-22 PT General Charges $$ ACUTE PT VISIT: 1 Visit                  Abelina Hoes PT Acute Rehabilitation Services Office [phone]   Dareen Ebbing 10/20/2023, 4:22 PM

## 2023-10-20 NOTE — TOC Progression Note (Signed)
 Transition of Care [facility]) - Progression Note    Patient Details  Name: Jacqueline Foley MRN: [id number] Date of Birth: March 19, 1933  Transition of Care [facility]) CM/SW Contact  Kathryn Parish, RN Phone Number: 10/20/2023, 4:37 PM  Clinical Narrative:    NCM updated patient daughter on PT/OT/and PASRR. NCM spoke with [name] for [place] [employer] about the Level 2 PASRR. NCM spoke with patient about progress for SNF. NCM called [facility] with update on PASRR    Expected Discharge Plan: Home w Home Health Services Barriers to Discharge: Continued Medical Work up  Expected Discharge Plan and Services In-house Referral: Chaplain Discharge Planning Services: CM Consult Post Acute Care Choice: Skilled Nursing Facility [facility]) Living arrangements for the past 2 months: Assisted Living Facility [facility])                 DME Arranged: N/A DME Agency: NA       HH Arranged: NA HH Agency: NA         Social Determinants of Health (SDOH) Interventions SDOH Screenings   Food Insecurity: No Food Insecurity (10/06/2023)  Housing: Low Risk  (10/06/2023)  Transportation Needs: No Transportation Needs (10/06/2023)  Utilities: Not At Risk (10/06/2023)  Social Connections: Socially Isolated (10/06/2023)  Tobacco Use: Low Risk  (10/06/2023)    Readmission Risk Interventions    10/07/2023    2:59 PM 08/14/2023    8:41 AM  Readmission Risk Prevention Plan  Transportation Screening Complete Complete  PCP or Specialist Appt within 5-7 Days Complete Complete  Home Care Screening Complete Complete  Medication Review (RN CM) Complete Complete

## 2023-10-20 NOTE — Plan of Care (Signed)

## 2023-10-20 NOTE — Progress Notes (Signed)
 [facility]                                                                              Jacqueline Foley, is a 89 y.o. female, DOB - [birth year], WUJ:[id number] Admit date - 10/06/2023    Outpatient Primary MD for the patient is [name], DO  LOS - 14  days  No chief complaint on file.      Brief summary   Patient is a 89 year old female with anxiety, depression, dementia, left hip fracture status post IM nailing 08/15/2023, CKD 3a, HTN, HLD comes to the ED for evaluation of altered mental status/paranoia and delusion.  Patient was found to have UTI, placed on IV Rocephin .   Blood cultures grew Streptococcus mitis and this was hard to distinguish if this was a contaminant. Reviewed with ID. Echocardiogram is negative, no further workup Awaiting placement  Assessment & Plan   Mild acute kidney injury superimposed on CKD stage IIIa  - Baseline creatinine 0.9 - Creatinine trended up to 1.02 on 5/14 - Creatinine now improved, at baseline   Acute UTI -Urine culture showed pansensitive E. coli -  has completed 5-day course of cefazolin    Streptococcus mitis bacteremia Discussed with ID Dr Noelle Batten discussed with ID, 2D echo negative for any vegetations.   Repeat blood cultures negative to date, no further workup - SLP evaluation completed, recommended regular diet with thin liquids -Per RN, may have some coughing, repeat SLP evaluation.  Chest x-ray shows no evidence of infiltrates or aspiration pneumonitis/PNA.   HTN -continue Coreg  6.25 mg BID, lisinopril  20 mg daily -Triamterene  HCTZ discontinued   Dementia  Paranoid behavior/delusions and hallucinations Acute metabolic encephalopathy--resolved Continue Zyprexa , Lexapro  and gabapentin  per psych Further workup including CTA, MRI, TSH and B12 are negative   Reactive airway disease Stable.  As needed bronchodilators    Left proximal fever intertrochanteric fracture secondary to fall -Status post IM  nailing by Dr. Bernard Brick 08/15/2023.   -PT/OT, awaiting SNF    Estimated body mass index is 22.1 kg/m as calculated from the following:   Height as of this encounter: 5\' 7"  (1.702 m).   Weight as of this encounter: 64 kg.  Code Status: Full code DVT Prophylaxis:  enoxaparin  (LOVENOX ) injection 40 mg Start: 10/09/23 1000 SCDs Start: 10/06/23 2251 Place TED hose Start: 10/06/23 2251   Level of Care: Level of care: Med-Surg Family Communication: Updated patient' Disposition Plan:      Remains inpatient appropriate: Awaiting SNF, medically stable   Procedures:    Consultants:     Antimicrobials:   Anti-infectives (From admission, onward)    Start     Dose/Rate Route Frequency Ordered Stop   10/08/23 1400  ceFAZolin  (ANCEF ) IVPB 2g/100 mL premix  Status:  Discontinued        2 g 200 mL/hr over 30 Minutes Intravenous Every 8 hours 10/08/23 1228 10/12/23 1517   10/07/23 2300  cefTRIAXone  (ROCEPHIN ) 1 g in sodium chloride  0.9 % 100 mL IVPB  Status:  Discontinued        1 g 200 mL/hr over 30 Minutes Intravenous Every 24 hours 10/06/23 2250 10/08/23 1228  10/06/23 2230  cefTRIAXone  (ROCEPHIN ) 2 g in sodium chloride  0.9 % 100 mL IVPB        2 g 200 mL/hr over 30 Minutes Intravenous  Once 10/06/23 2226 10/07/23 0006          Medications  atorvastatin   20 mg Oral Daily   carvedilol   6.25 mg Oral BID WC   enoxaparin  (LOVENOX ) injection  40 mg Subcutaneous Q24H   escitalopram   10 mg Oral Daily   fluticasone   1 spray Each Nare Daily   fluticasone  furoate-vilanterol  1 puff Inhalation Daily   haloperidol  lactate  2 mg Intravenous Once   lisinopril   20 mg Oral Daily   LORazepam   1 mg Intravenous Once   OLANZapine   7.5 mg Oral QHS   pantoprazole   40 mg Oral Daily   polyethylene glycol  17 g Oral BID   sodium chloride  flush  3 mL Intravenous Q12H   sodium chloride  flush  3 mL Intravenous Q12H      Subjective:   Jacqueline Foley was seen and examined today.  No acute  complaints, resting comfortably.  Does not want to eat breakfast, wants to sleep.  Denies any specific complaints.  Objective:   Vitals:   10/19/23 1203 10/19/23 1754 10/19/23 1957 10/20/23 0439  BP: (!) 147/93 (!) 146/96 133/80 (!) 160/79  Pulse: 99 91 91 80  Resp: 16  18 18   Temp: 98.4 F (36.9 C)  97.9 F (36.6 C) 97.8 F (36.6 C)  TempSrc: Oral  Oral   SpO2: 95%  99% 98%  Weight:      Height:        Intake/Output Summary (Last 24 hours) at 10/20/2023 1310 Last data filed at 10/20/2023 0945 Gross per 24 hour  Intake 360 ml  Output --  Net 360 ml     Wt Readings from Last 3 Encounters:  10/06/23 64 kg  08/15/23 64 kg  07/19/23 61.7 kg    Physical Exam General: Sleepy but easily arousable, NAD Cardiovascular: S1 S2 clear, RRR.  Respiratory: CTAB, no wheezing Gastrointestinal: Soft, nontender, nondistended, NBS Ext: no pedal edema bilaterally Psych: somnolent but arousable, wants to sleep     Data Reviewed:  I have personally reviewed following labs    CBC Lab Results  Component Value Date   WBC 4.9 10/18/2023   RBC 3.41 (L) 10/18/2023   HGB 9.7 (L) 10/18/2023   HCT 31.3 (L) 10/18/2023   MCV 91.8 10/18/2023   MCH 28.4 10/18/2023   PLT 267 10/18/2023   MCHC 31.0 10/18/2023   RDW 13.9 10/18/2023   LYMPHSABS 2.0 10/06/2023   MONOABS 0.5 10/06/2023   EOSABS 0.2 10/06/2023   BASOSABS 0.1 10/06/2023     Last metabolic panel Lab Results  Component Value Date   NA 141 10/18/2023   K 4.0 10/18/2023   CL 107 10/18/2023   CO2 27 10/18/2023   BUN 22 10/18/2023   CREATININE 0.74 10/18/2023   GLUCOSE 100 (H) 10/18/2023   GFRNONAA >60 10/18/2023   GFRAA >60 11/09/2017   CALCIUM  9.1 10/18/2023   PHOS 3.3 10/08/2023   PROT 6.4 (L) 10/18/2023   ALBUMIN 3.2 (L) 10/18/2023   LABGLOB 2.8 12/01/2022   BILITOT 0.5 10/18/2023   ALKPHOS 79 10/18/2023   AST 23 10/18/2023   ALT 18 10/18/2023   ANIONGAP 7 10/18/2023    CBG (last 3)  No results for  input(s): "GLUCAP" in the last 72 hours.    Coagulation Profile: No results for  input(s): "INR", "PROTIME" in the last 168 hours.   Radiology Studies: I have personally reviewed the imaging studies  No results found.     Bertram Brocks M.D. [facility] 10/20/2023, 1:10 PM  Available via Epic secure chat 7am-7pm After 7 pm, please refer to night coverage provider listed on amion.

## 2023-10-20 NOTE — Telephone Encounter (Signed)
 Pt dtr wants to update you on mom, has been in hospital, fractured her hip since you saw her last. Then had to be readmitted  due bad UTI, looking for new facility now. Would like her reevaluated for cognitive impairment. Would like to know which way you wan to go with her; needs to schedule transportation if she needs to be seen. Still at [facility] now.

## 2023-10-21 DIAGNOSIS — G9341 Metabolic encephalopathy: Secondary | ICD-10-CM | POA: Diagnosis not present

## 2023-10-21 MED ORDER — TRAMADOL HCL 50 MG PO TABS: 50.0000 mg | ORAL_TABLET | Freq: Four times a day (QID) | ORAL | 0 refills | Status: AC | PRN

## 2023-10-21 MED ORDER — OLANZAPINE 7.5 MG PO TABS: 7.5000 mg | ORAL_TABLET | Freq: Every day | ORAL | Status: AC

## 2023-10-21 MED ORDER — CARVEDILOL 6.25 MG PO TABS: 6.2500 mg | ORAL_TABLET | Freq: Two times a day (BID) | ORAL | Status: DC

## 2023-10-21 MED ORDER — LISINOPRIL 20 MG PO TABS: 20.0000 mg | ORAL_TABLET | Freq: Every day | ORAL | Status: DC

## 2023-10-21 NOTE — TOC Progression Note (Signed)
 Transition of Care [facility]) - Progression Note    Patient Details  Name: Jacqueline Foley MRN: [id number] Date of Birth: 07-01-1932  Transition of [facility]) CM/SW Contact  Kathryn Parish, RN Phone Number: 10/21/2023, 4:11 PM  Clinical Narrative:    Per MD, patient ready for DC to [facility]. RN to call report prior to discharge to [phone] [facility], Rm 508. RN, patient, patient's family, and facility notified of DC. Discharge Summary and FL2 sent to facility. DC packet on chart includes medical necessity, face sheet, discharge summary and signed prescription. Ambulance transport requested for patient.    Expected Discharge Plan: Home w Home Health Services Barriers to Discharge: Barriers Resolved  Expected Discharge Plan and Services In-house Referral: Chaplain Discharge Planning Services: CM Consult Post Acute Care Choice: Skilled Nursing Facility [facility]) Living arrangements for the past 2 months: Assisted Living Facility ([facility]) Expected Discharge Date: 10/21/23               DME Arranged: N/A DME Agency: NA       HH Arranged: NA HH Agency: NA         Social Determinants of Health (SDOH) Interventions SDOH Screenings   Food Insecurity: No Food Insecurity (10/06/2023)  Housing: Low Risk  (10/06/2023)  Transportation Needs: No Transportation Needs (10/06/2023)  Utilities: Not At Risk (10/06/2023)  Social Connections: Socially Isolated (10/06/2023)  Tobacco Use: Low Risk  (10/06/2023)    Readmission Risk Interventions    10/07/2023    2:59 PM 08/14/2023    8:41 AM  Readmission Risk Prevention Plan  Transportation Screening Complete Complete  PCP or Specialist Appt within 5-7 Days Complete Complete  Home Care Screening Complete Complete  Medication Review (RN CM) Complete Complete

## 2023-10-21 NOTE — Plan of Care (Signed)
 Report given to [facility], nurse taking over for when pt transfers to [place] [address], room 508 at [facility].  IV accesses discontinued.  Pt has all belongings.

## 2023-10-21 NOTE — Telephone Encounter (Signed)
 This is not a good time to check cognition with the illness and fracture, should wait until patient is medically managed and is also stable in her living situation at this time I would wait until stable no need to see her for cognition

## 2023-10-21 NOTE — Discharge Summary (Signed)
 Physician Discharge Summary   Patient: Jacqueline Foley MRN: [id number] DOB: 09-Sep-1932  Admit date:     10/06/2023  Discharge date: 10/21/23  Discharge Physician: Ephriam Hashimoto   PCP: [name], DO     Recommendations at discharge:  [facility]: Please taper olanzapine  (see below) Follow up with PCP Dr. Donalynn Fry 1 week after discharge from SNF for UTI Follow up with Neurology Dr. Tresia Fruit until fully recovered from fracture and UTI and in a stable living situation for evaluation for cognitive impairment Check CBC and BMP in 1 week (discharge Cr 0.7, Na 141, Hgb 9.7)     Discharge Diagnoses: Principal Problem:   Acute metabolic encephalopathy due to UTI Active Problems:   History of closed left femoral fracture (HCC)   Acute cystitis   Essential hypertension   Hyperlipidemia   Reactive airway disease   Generalized anxiety disorder   History of dementia   CKD (chronic kidney disease), stage III (HCC)   Major depressive disorder, recurrent episode, moderate (HCC)   AKI ruled out    Hospital Course: 89 year old with history of GAD, depression, dementia/memory impairment, left hip fracture status post IM nailing 08/15/2023, CKD 3A, HTN, HLD who presented with confusion, paranoia and delusions.  In the ER, found to have UTI, started on Rocephin .         Acute metabolic encephalopathy due to UTI Delirium, possibly superimposed on incipient cognitive impairment History of generalized anxiety disorder No prior significant deficits.  Here had delirium with agitation which persisted.  Psychiatry consulted and started on Zyprexa  and Lexapro  with improvement.  B12 and ammonia and TSH normal.  MRI brain and CTA head and neck normal.    Given black box warning, will plan to taper Zyprexa : once established at [facility], please taper to olanzapine  5 mg nightly from 7.5  After one more week, please taper off.      Acute cystitis, E. coli Treated and  resolved.  Dysphagia Chopped diet.  Thin liquids. - Recommend speech to follow  Streptococcus mitis bacteremia Discussed with ID.  Echo normal.  Likely contaminant.   Essential hypertension, uncontrolled Maxzide  stopped. Started on amlodipine , Coreg .      Hyperlipidemia Stable on Lipitor   CKD stage IIIa Creatinine 0.9 and GFR 58.  Renal function at baseline.  Continue to monitor   Left proximal fever intertrochanteric fracture secondary to fall Status post IM nailing by Dr. Bernard Brick 08/15/2023.                 The [place]  Controlled Substances Registry was reviewed for this patient prior to discharge.  Consultants: Psychiatry Procedures performed: MRI brain CTA head  Echo   Disposition: Skilled nursing facility Diet recommendation: Dysphagia 2 (chopped consistency) thin liquids   DISCHARGE MEDICATION: Allergies as of 10/21/2023       Reactions   Tomato Swelling, Rash   Protonix  [pantoprazole ] Diarrhea        Medication List     STOP taking these medications    sertraline  100 MG tablet Commonly known as: ZOLOFT    triamterene -hydrochlorothiazide  37.5-25 MG tablet Commonly known as: MAXZIDE -25       TAKE these medications    acetaminophen  500 MG tablet Commonly known as: TYLENOL  Take 1,000 mg by mouth 3 (three) times daily.   Tylenol  325 MG tablet Generic drug: acetaminophen  Take 325 mg by mouth 2 (two) times daily as needed (for pain).   Advair Diskus 500-50 MCG/ACT Aepb Generic drug: fluticasone -salmeterol Inhale 1 puff into the  lungs See admin instructions. Inhale 1 puff into the lungs twice a day and rinse mouth afterwards- with every use   albuterol  108 (90 Base) MCG/ACT inhaler Commonly known as: VENTOLIN  HFA Inhale 2 puffs into the lungs every 6 (six) hours as needed for wheezing or shortness of breath.   atorvastatin  20 MG tablet Commonly known as: LIPITOR Take 20 mg by mouth daily.   Biofreeze Cool The Pain 4 %  Gel Generic drug: Menthol  (Topical Analgesic) Apply 1 application  topically 4 (four) times daily as needed (for pain or discomfort- affected joints).   carvedilol  6.25 MG tablet Commonly known as: COREG  Take 1 tablet (6.25 mg total) by mouth 2 (two) times daily with a meal.   cetirizine 10 MG tablet Commonly known as: ZYRTEC Take 10 mg by mouth daily.   escitalopram  10 MG tablet Commonly known as: LEXAPRO  Take 10 mg by mouth in the morning.   fluticasone  50 MCG/ACT nasal spray Commonly known as: FLONASE  Place 1 spray into both nostrils daily.   lisinopril  20 MG tablet Commonly known as: ZESTRIL  Take 1 tablet (20 mg total) by mouth daily. Start taking on: Oct 22, 2023   methocarbamol  500 MG tablet Commonly known as: ROBAXIN  Take 1 tablet (500 mg total) by mouth every 6 (six) hours as needed for muscle spasms. What changed:  when to take this reasons to take this   nitroGLYCERIN  0.4 MG SL tablet Commonly known as: NITROSTAT  Place 0.4 mg under the tongue every 5 (five) minutes as needed for chest pain.   OLANZapine  7.5 MG tablet Commonly known as: ZYPREXA  Take 1 tablet (7.5 mg total) by mouth at bedtime.   omeprazole  20 MG capsule Commonly known as: PRILOSEC Take 20 mg by mouth daily.   polyethylene glycol 17 g packet Commonly known as: MIRALAX  / GLYCOLAX  Take 17 g by mouth 2 (two) times daily.   traMADol  50 MG tablet Commonly known as: Ultram  Take 1 tablet (50 mg total) by mouth every 6 (six) hours as needed for severe pain (pain score 7-10). What changed:  when to take this reasons to take this        Contact information for after-discharge care     Destination     [facility] .   Service: Skilled Nursing Contact information: [address] [place]  [address] [phone]                     Discharge Instructions     Discharge patient   Complete by: As directed    Discharge disposition: 03-Skilled  Nursing Facility   Discharge patient date: 10/21/2023       Discharge Exam: Jacqueline Foley   10/06/23 2316  Weight: 64 kg    General: Pt is alert, awake, not in acute distress, pleasant Cardiovascular: RRR, nl S1-S2, no murmurs appreciated.   No LE edema.   Respiratory: Normal respiratory rate and rhythm.  CTAB without rales or wheezes. Abdominal: Abdomen soft and non-tender.  No distension or HSM.   Neuro/Psych: Strength symmetric in upper and lower extremities.  Judgment and insight appear impaired but at baseline.   Condition at discharge: fair  The results of significant diagnostics from this hospitalization (including imaging, microbiology, ancillary and laboratory) are listed below for reference.   Imaging Studies: DG ESOPHAGUS W SINGLE CM (SOL OR THIN BA) Result Date: 10/13/2023 CLINICAL DATA:  Patient is a 89 y/o female with history of anxiety, depression, memory impairment, CKD, HTN,  left hip fracture-s/p IM nailing, and HLD. Patient reports difficulty with dysphagia and "fullness" in the chest. She notes food will get stuck in her throat. EXAM: ESOPHAGUS/BARIUM SWALLOW TECHNIQUE: Single contrast examination was performed using thin liquid barium. This exam was performed by [facility] and was supervised and interpreted by Dr. Ritchie Cheshire. FLUOROSCOPY: Radiation Exposure Index (as provided by the fluoroscopic device): 3.30 mGy Kerma COMPARISON:  None Available. FINDINGS: Swallowing: Evidence of penetration.  No aspiration visualized. Pharynx: Unremarkable. Esophagus: Normal appearance. Esophageal motility: Within normal limits. Hiatal Hernia: None. Gastroesophageal reflux: None visualized. Ingested 13mm barium tablet: Patient unable to swallow tablet. Other: Study limited. Patient was unable to get in proper positions. IMPRESSION: *Single contrast esophagram with evidence of penetration without frank aspiration. Overall limited study due to patient's inability to get in  proper position. Electronically Signed   By: Beula Brunswick M.D.   On: 10/13/2023 12:26   DG CHEST PORT 1 VIEW Result Date: 10/12/2023 CLINICAL DATA:  Aspiration. EXAM: PORTABLE CHEST 1 VIEW COMPARISON:  August 13, 2023 FINDINGS: The heart size and mediastinal contours are within normal limits. Both lungs are clear. The visualized skeletal structures are unremarkable. IMPRESSION: No active disease. Electronically Signed   By: Rosalene Colon M.D.   On: 10/12/2023 13:50   ECHOCARDIOGRAM COMPLETE Result Date: 10/09/2023    ECHOCARDIOGRAM REPORT   Patient Name:   Jacqueline Foley Date of Exam: 10/09/2023 Medical Rec #:  [id number]        Height:       67.0 in Accession #:    [id number]       Weight:       141.1 lb Date of Birth:  [birth year]        BSA:          1.744 m Patient Age:    89 years         BP:           128/93 mmHg Patient Gender: F                HR:           98 bpm. Exam Location:  Inpatient Procedure: 2D Echo, Color Doppler and Cardiac Doppler (Both Spectral and Color            Flow Doppler were utilized during procedure). Indications:    Bacteremia  History:        Patient has prior history of Echocardiogram examinations, most                 recent 07/08/2023. TIA, Signs/Symptoms:Chest Pain; Risk                 Factors:Diabetes, Hypertension and Dyslipidemia. CKD stage 3.  Sonographer:    Juanita Shaw Referring Phys: [id number] ANKIT C AMIN IMPRESSIONS  1. Left ventricular ejection fraction, by estimation, is 60 to 65%. The left ventricle has normal function. The left ventricle has no regional wall motion abnormalities. There is moderate asymmetric left ventricular hypertrophy of the basal-septal segment. Indeterminate diastolic filling due to E-A fusion.  2. Right ventricular systolic function is normal. The right ventricular size is normal.  3. The mitral valve is normal in structure. Mild mitral valve regurgitation. No evidence of mitral stenosis.  4. The aortic valve is calcified. Aortic valve  regurgitation is not visualized. Aortic valve sclerosis is present, with no evidence of aortic valve stenosis.  5. The inferior vena cava is normal in size with greater than  50% respiratory variability, suggesting right atrial pressure of 3 mmHg. FINDINGS  Left Ventricle: Left ventricular ejection fraction, by estimation, is 60 to 65%. The left ventricle has normal function. The left ventricle has no regional wall motion abnormalities. Strain was performed and the global longitudinal strain is indeterminate. The left ventricular internal cavity size was normal in size. There is moderate asymmetric left ventricular hypertrophy of the basal-septal segment. Indeterminate diastolic filling due to E-A fusion. Right Ventricle: The right ventricular size is normal. No increase in right ventricular wall thickness. Right ventricular systolic function is normal. Left Atrium: Left atrial size was normal in size. Right Atrium: Right atrial size was normal in size. Pericardium: There is no evidence of pericardial effusion. Mitral Valve: The mitral valve is normal in structure. Mild mitral valve regurgitation. No evidence of mitral valve stenosis. MV peak gradient, 4.7 mmHg. The mean mitral valve gradient is 2.0 mmHg. Tricuspid Valve: The tricuspid valve is normal in structure. Tricuspid valve regurgitation is not demonstrated. No evidence of tricuspid stenosis. Aortic Valve: The aortic valve is calcified. There is mild to moderate aortic valve annular calcification. Aortic valve regurgitation is not visualized. Aortic valve sclerosis is present, with no evidence of aortic valve stenosis. Aortic valve mean gradient measures 3.0 mmHg. Aortic valve peak gradient measures 6.2 mmHg. Aortic valve area, by VTI measures 1.28 cm. Pulmonic Valve: The pulmonic valve was normal in structure. Pulmonic valve regurgitation is not visualized. No evidence of pulmonic stenosis. Aorta: The aortic root and ascending aorta are structurally normal,  with no evidence of dilitation. Venous: The inferior vena cava is normal in size with greater than 50% respiratory variability, suggesting right atrial pressure of 3 mmHg. IAS/Shunts: The atrial septum is grossly normal. Additional Comments: 3D was performed not requiring image post processing on an independent workstation and was indeterminate.  LEFT VENTRICLE PLAX 2D LVIDd:         3.10 cm     Diastology LVIDs:         2.20 cm     LV e' medial:    5.33 cm/s LV PW:         0.80 cm     LV E/e' medial:  11.8 LV IVS:        0.90 cm     LV e' lateral:   8.38 cm/s LVOT diam:     1.70 cm     LV E/e' lateral: 7.5 LV SV:         25 LV SV Index:   14 LVOT Area:     2.27 cm  LV Volumes (MOD) LV vol d, MOD A2C: 70.3 ml LV vol d, MOD A4C: 91.6 ml LV vol s, MOD A2C: 32.3 ml LV vol s, MOD A4C: 28.5 ml LV SV MOD A2C:     38.0 ml LV SV MOD A4C:     91.6 ml LV SV MOD BP:      49.1 ml RIGHT VENTRICLE             IVC RV Basal diam:  3.10 cm     IVC diam: 1.20 cm RV Mid diam:    1.90 cm RV S prime:     10.60 cm/s TAPSE (M-mode): 1.7 cm LEFT ATRIUM             Index        RIGHT ATRIUM          Index LA diam:        2.60 cm  1.49 cm/m   RA Area:     7.56 cm LA Vol (A2C):   15.8 ml 9.06 ml/m   RA Volume:   12.90 ml 7.40 ml/m LA Vol (A4C):   24.4 ml 13.99 ml/m LA Biplane Vol: 19.8 ml 11.36 ml/m  AORTIC VALVE                    PULMONIC VALVE AV Area (Vmax):    1.60 cm     PV Vmax:       0.95 m/s AV Area (Vmean):   1.35 cm     PV Peak grad:  3.6 mmHg AV Area (VTI):     1.28 cm AV Vmax:           124.00 cm/s AV Vmean:          82.200 cm/s AV VTI:            0.191 m AV Peak Grad:      6.2 mmHg AV Mean Grad:      3.0 mmHg LVOT Vmax:         87.30 cm/s LVOT Vmean:        48.800 cm/s LVOT VTI:          0.108 m LVOT/AV VTI ratio: 0.57  AORTA Ao Root diam: 2.70 cm Ao Asc diam:  2.40 cm MITRAL VALVE MV Area (PHT): 4.57 cm     SHUNTS MV Area VTI:   1.67 cm     Systemic VTI:  0.11 m MV Peak grad:  4.7 mmHg     Systemic Diam: 1.70 cm MV  Mean grad:  2.0 mmHg MV Vmax:       1.08 m/s MV Vmean:      63.8 cm/s MV Decel Time: 166 msec MV E velocity: 62.80 cm/s MV A velocity: 104.00 cm/s MV E/A ratio:  0.60 Gloriann Larger MD Electronically signed by Gloriann Larger MD Signature Date/Time: 10/09/2023/4:45:23 PM    Final    CT ANGIO HEAD NECK W WO CM Result Date: 10/08/2023 CLINICAL DATA:  Neuro deficit, acute, stroke suspected EXAM: CT ANGIOGRAPHY HEAD AND NECK WITH AND WITHOUT CONTRAST TECHNIQUE: Multidetector CT imaging of the head and neck was performed using the standard protocol during bolus administration of intravenous contrast. Multiplanar CT image reconstructions and MIPs were obtained to evaluate the vascular anatomy. Carotid stenosis measurements (when applicable) are obtained utilizing NASCET criteria, using the distal internal carotid diameter as the denominator. RADIATION DOSE REDUCTION: This exam was performed according to the departmental dose-optimization program which includes automated exposure control, adjustment of the mA and/or kV according to patient size and/or use of iterative reconstruction technique. CONTRAST:  75mL OMNIPAQUE  IOHEXOL  350 MG/ML SOLN COMPARISON:  Same day MRI head. FINDINGS: CT HEAD FINDINGS Brain: No evidence of acute infarction, hemorrhage, hydrocephalus, extra-axial collection or mass lesion/mass effect. Vascular: See below. Skull: No acute fracture. Sinuses/Orbits: Clear sinuses.  No acute orbital findings. Other: No mastoid effusions. Review of the MIP images confirms the above findings CTA NECK FINDINGS Aortic arch: Great vessel origins are patent without significant stenosis. Right carotid system: No evidence of dissection, stenosis (50% or greater), or occlusion. Left carotid system: No evidence of dissection, stenosis (50% or greater), or occlusion. Vertebral arteries: Left dominant. No evidence of dissection, stenosis (50% or greater), or occlusion. Skeleton: Multilevel degenerative change.  No evidence of acute abnormality on limited assessment. Other neck: No acute abnormality on limited assessment. Upper chest: Visualized lung apices are clear. Review of  the MIP images confirms the above findings CTA HEAD FINDINGS Anterior circulation: Bilateral intracranial ICAs, MCAs, and ACAs, are patent without proximal hemodynamically significant stenosis. Posterior circulation: Bilateral intradural vertebral arteries, basilar artery, and bilateral posterior cerebral arteries are patent without proximal hemodynamically significant stenosis. Venous sinuses: As permitted by contrast timing, patent. Review of the MIP images confirms the above findings IMPRESSION: No large vessel occlusion or proximal hemodynamically significant stenosis. Electronically Signed   By: Stevenson Elbe M.D.   On: 10/08/2023 12:04   MR BRAIN WO CONTRAST Result Date: 10/08/2023 CLINICAL DATA:  Transient ischemic attack (TIA) EXAM: MRI HEAD WITHOUT CONTRAST TECHNIQUE: Multiplanar, multiecho pulse sequences of the brain and surrounding structures were obtained without intravenous contrast. COMPARISON:  CT head March 12, 2023. FINDINGS: Brain: No acute infarction, hemorrhage, hydrocephalus, extra-axial collection or mass lesion. Vascular: Normal flow voids. Skull and upper cervical spine: Normal marrow signal. Sinuses/Orbits: Clear sinuses.  No acute orbital findings. Other: No mastoid effusions. IMPRESSION: No evidence of acute intracranial abnormality. Electronically Signed   By: Stevenson Elbe M.D.   On: 10/08/2023 11:31    Microbiology: Results for orders placed or performed during the hospital encounter of 10/06/23  Urine Culture     Status: Abnormal   Collection Time: 10/06/23  9:18 PM   Specimen: Urine, Catheterized  Result Value Ref Range Status   Specimen Description   Final    URINE, CATHETERIZED Performed at [facility], [address], [place], [place] [address]    Special Requests    Final    NONE Performed at [facility], [address] [place], [place] [address]    Culture >=100,000 COLONIES/mL ESCHERICHIA COLI (A)  Final   Report Status 10/08/2023 FINAL  Final   Organism ID, Bacteria ESCHERICHIA COLI (A)  Final      Susceptibility   Escherichia coli - MIC*    AMPICILLIN 16 INTERMEDIATE Intermediate     CEFAZOLIN  <=4 SENSITIVE Sensitive     CEFEPIME <=0.12 SENSITIVE Sensitive     CEFTRIAXONE  <=0.25 SENSITIVE Sensitive     CIPROFLOXACIN  <=0.25 SENSITIVE Sensitive     GENTAMICIN <=1 SENSITIVE Sensitive     IMIPENEM <=0.25 SENSITIVE Sensitive     NITROFURANTOIN  <=16 SENSITIVE Sensitive     TRIMETH/SULFA >=320 RESISTANT Resistant     AMPICILLIN/SULBACTAM 8 SENSITIVE Sensitive     PIP/TAZO 8 SENSITIVE Sensitive ug/mL    * >=100,000 COLONIES/mL ESCHERICHIA COLI  Culture, blood (routine x 2)     Status: Abnormal   Collection Time: 10/06/23 10:49 PM   Specimen: BLOOD  Result Value Ref Range Status   Specimen Description   Final    BLOOD RIGHT ANTECUBITAL Performed at [facility], [address] [place], [place] [address]    Special Requests   Final    BOTTLES DRAWN AEROBIC AND ANAEROBIC Blood Culture results may not be optimal due to an inadequate volume of blood received in culture bottles Performed at [facility], [address], [place], [place] [address]    Culture  Setup Time   Final    GRAM POSITIVE COCCI AEROBIC BOTTLE ONLY Organism ID to follow CRITICAL RESULT CALLED TO, READ BACK BY AND VERIFIED WITH: L POINDEXTER,[email or web address]  10/08/23 MK Performed at [facility], [address], [place], [place] [address]    Culture STREPTOCOCCUS MITIS/ORALIS (A)  Final   Report Status 10/09/2023 FINAL  Final   Organism ID, Bacteria STREPTOCOCCUS MITIS/ORALIS  Final  Susceptibility   Streptococcus mitis/oralis - MIC*    PENICILLIN <=0.06 SENSITIVE Sensitive     CEFTRIAXONE  <=0.12 SENSITIVE  Sensitive     LEVOFLOXACIN 1 SENSITIVE Sensitive     VANCOMYCIN 0.5 SENSITIVE Sensitive     * STREPTOCOCCUS MITIS/ORALIS  Culture, blood (routine x 2)     Status: None   Collection Time: 10/06/23 10:49 PM   Specimen: BLOOD LEFT HAND  Result Value Ref Range Status   Specimen Description   Final    BLOOD LEFT HAND Performed at [facility], [address], [place], [place] [address]    Special Requests   Final    BOTTLES DRAWN AEROBIC AND ANAEROBIC Blood Culture results may not be optimal due to an inadequate volume of blood received in culture bottles Performed at [facility], [address], [place], [place] [address]    Culture   Final    NO GROWTH 5 DAYS Performed at [facility], [address], [place], [place] [address]    Report Status 10/12/2023 FINAL  Final  Blood Culture ID Panel (Reflexed)     Status: Abnormal   Collection Time: 10/06/23 10:49 PM  Result Value Ref Range Status   Enterococcus faecalis NOT DETECTED NOT DETECTED Final   Enterococcus Faecium NOT DETECTED NOT DETECTED Final   Listeria monocytogenes NOT DETECTED NOT DETECTED Final   Staphylococcus species NOT DETECTED NOT DETECTED Final   Staphylococcus aureus (BCID) NOT DETECTED NOT DETECTED Final   Staphylococcus epidermidis NOT DETECTED NOT DETECTED Final   Staphylococcus lugdunensis NOT DETECTED NOT DETECTED Final   Streptococcus species DETECTED (A) NOT DETECTED Final    Comment: Not Enterococcus species, Streptococcus agalactiae, Streptococcus pyogenes, or Streptococcus pneumoniae. CRITICAL RESULT CALLED TO, READ BACK BY AND VERIFIED WITH: L POINDEXTER,[email or web address]  10/08/23 MK    Streptococcus agalactiae NOT DETECTED NOT DETECTED Final   Streptococcus pneumoniae NOT DETECTED NOT DETECTED Final   Streptococcus pyogenes NOT DETECTED NOT DETECTED Final   A.calcoaceticus-baumannii NOT DETECTED NOT DETECTED Final   Bacteroides fragilis NOT DETECTED NOT DETECTED Final    Enterobacterales NOT DETECTED NOT DETECTED Final   Enterobacter cloacae complex NOT DETECTED NOT DETECTED Final   Escherichia coli NOT DETECTED NOT DETECTED Final   Klebsiella aerogenes NOT DETECTED NOT DETECTED Final   Klebsiella oxytoca NOT DETECTED NOT DETECTED Final   Klebsiella pneumoniae NOT DETECTED NOT DETECTED Final   Proteus species NOT DETECTED NOT DETECTED Final   Salmonella species NOT DETECTED NOT DETECTED Final   Serratia marcescens NOT DETECTED NOT DETECTED Final   Haemophilus influenzae NOT DETECTED NOT DETECTED Final   Neisseria meningitidis NOT DETECTED NOT DETECTED Final   Pseudomonas aeruginosa NOT DETECTED NOT DETECTED Final   Stenotrophomonas maltophilia NOT DETECTED NOT DETECTED Final   Candida albicans NOT DETECTED NOT DETECTED Final   Candida auris NOT DETECTED NOT DETECTED Final   Candida glabrata NOT DETECTED NOT DETECTED Final   Candida krusei NOT DETECTED NOT DETECTED Final   Candida parapsilosis NOT DETECTED NOT DETECTED Final   Candida tropicalis NOT DETECTED NOT DETECTED Final   Cryptococcus neoformans/gattii NOT DETECTED NOT DETECTED Final    Comment: Performed at [facility], [address] [place], [place] [address]  Culture, blood (Routine X 2) w Reflex to ID Panel     Status: None   Collection Time: 10/09/23 11:11 AM   Specimen: BLOOD RIGHT HAND  Result Value Ref Range Status   Specimen Description   Final  BLOOD RIGHT HAND AEROBIC BOTTLE ONLY Performed at [facility], [address] [place], [place] [address]    Special Requests   Final    BOTTLES DRAWN AEROBIC ONLY Blood Culture results may not be optimal due to an inadequate volume of blood received in culture bottles Performed at [facility] [address] [place], [place] [address]    Culture   Final    NO GROWTH 5 DAYS Performed at [facility], [address] [place], [place] [address]    Report Status 10/14/2023 FINAL  Final   Culture, blood (Routine X 2) w Reflex to ID Panel     Status: None   Collection Time: 10/09/23 11:16 AM   Specimen: BLOOD LEFT ARM  Result Value Ref Range Status   Specimen Description   Final    BLOOD LEFT ARM AEROBIC BOTTLE ONLY ANAEROBIC BOTTLE ONLY Performed at [facility], [address] [place], [place] [address]    Special Requests   Final    BOTTLES DRAWN AEROBIC AND ANAEROBIC Blood Culture results may not be optimal due to an inadequate volume of blood received in culture bottles Performed at [facility], [address] [place], [place] [address]    Culture   Final    NO GROWTH 5 DAYS Performed at [facility], [address] [address] [place], [place] [address]    Report Status 10/14/2023 FINAL  Final    Labs: CBC: Recent Labs  Lab 10/18/23 0839  WBC 4.9  HGB 9.7*  HCT 31.3*  MCV 91.8  PLT 267   Basic Metabolic Panel: Recent Labs  Lab 10/18/23 0839  NA 141  K 4.0  CL 107  CO2 27  GLUCOSE 100*  BUN 22  CREATININE 0.74  CALCIUM  9.1   Liver Function Tests: Recent Labs  Lab 10/18/23 0839  AST 23  ALT 18  ALKPHOS 79  BILITOT 0.5  PROT 6.4*  ALBUMIN 3.2*   CBG: No results for input(s): "GLUCAP" in the last 168 hours.  Discharge time spent: approximately 45 minutes spent on discharge counseling, evaluation of patient on day of discharge, and coordination of discharge planning with nursing, social work, pharmacy and case management  Signed: Ephriam Hashimoto, MD [facility] 10/21/2023

## 2023-10-21 NOTE — NC FL2 (Signed)
 [place]  MEDICAID FL2 LEVEL OF CARE FORM     IDENTIFICATION  Patient Name: Jacqueline Foley Birthdate: 08/11/1932 Sex: female Admission Date (Current Location): 10/06/2023  [facility] and [place] Number:  Producer, television/film/video and Address:  [facility],  [address], [place] [address]      Provider Number: [id number]  Attending Physician Name and Address:  Ephriam Hashimoto, *  Relative Name and Phone Number:  Neal,Cheri (Daughter)  [phone] (Mobile    Current Level of Care: Hospital Recommended Level of Care: Skilled Nursing Facility Prior Approval Number:    Date Approved/Denied:   PASRR Number: [id number]  Discharge Plan: SNF    Current Diagnoses: Patient Active Problem List   Diagnosis Date Noted   Major depressive disorder, recurrent episode, moderate (HCC) 10/07/2023   Acute cystitis 10/06/2023   Generalized anxiety disorder 10/06/2023   History of dementia 10/06/2023   CKD (chronic kidney disease), stage III (HCC) 10/06/2023   Acute metabolic encephalopathy 10/06/2023   History of closed left femoral fracture (HCC) 08/13/2023   Fall at home, initial encounter 08/13/2023   Acute kidney injury superimposed on CKD (HCC) 08/13/2023   TIA (transient ischemic attack) 08/13/2023   GERD (gastroesophageal reflux disease) 08/13/2023   Hyperlipidemia 08/13/2023   Dementia without behavioral disturbance (HCC) 08/13/2023   Reactive airway disease 08/13/2023   Femur fracture, left (HCC) 08/13/2023   Aortic atherosclerosis (HCC) 03/18/2020   History of chest pain 03/19/2019   Chest pain 11/08/2017   Lumbar radiculopathy 05/02/2017   Precordial chest pain 12/06/2013   Chest pain at rest, negative MI, negative stress test, may have been GI 12/06/2013   Essential hypertension 12/06/2013   DOE (dyspnea on exertion), may be due to HTN 12/06/2013    Orientation RESPIRATION BLADDER Height & Weight     Self, Time, Situation, Place  Normal Continent  Weight: 64 kg Height:  5\' 7"  (170.2 cm)  BEHAVIORAL SYMPTOMS/MOOD NEUROLOGICAL BOWEL NUTRITION STATUS      Continent Diet  AMBULATORY STATUS COMMUNICATION OF NEEDS Skin   Limited Assist Verbally Normal                       Personal Care Assistance Level of Assistance              Functional Limitations Info             SPECIAL CARE FACTORS FREQUENCY  PT (By licensed PT), OT (By licensed OT)     PT Frequency: 5x weekly OT Frequency: 5x weekly            Contractures Contractures Info: Not present    Additional Factors Info  Allergies, Psychotropic   Allergies Info: tomato, protonix  Psychotropic Info: zoloftft         Current Medications (10/21/2023):  This is the current hospital active medication list Current Facility-Administered Medications  Medication Dose Route Frequency Provider Last Rate Last Admin   acetaminophen  (TYLENOL ) tablet 650 mg  650 mg Oral Q6H PRN Sundil, Subrina, MD   650 mg at 10/20/23 2126   Or   acetaminophen  (TYLENOL ) suppository 650 mg  650 mg Rectal Q6H PRN Sundil, Subrina, MD       acetaminophen  (TYLENOL ) tablet 325 mg  325 mg Oral BID PRN Amin, Ankit C, MD   325 mg at 10/14/23 0209   atorvastatin  (LIPITOR) tablet 20 mg  20 mg Oral Daily Sundil, Subrina, MD   20 mg at 10/21/23 0825   carvedilol  (COREG )  tablet 6.25 mg  6.25 mg Oral BID WC Amin, Ankit C, MD   6.25 mg at 10/21/23 0825   dextromethorphan -guaiFENesin  (MUCINEX  DM) 30-600 MG per 12 hr tablet 1 tablet  1 tablet Oral BID PRN Amin, Ankit C, MD   1 tablet at 10/21/23 1208   enoxaparin  (LOVENOX ) injection 40 mg  40 mg Subcutaneous Q24H Ellington, Abby K, RPH   40 mg at 10/21/23 0825   escitalopram  (LEXAPRO ) tablet 10 mg  10 mg Oral Daily Lord, Jamison Y, NP   10 mg at 10/21/23 0825   fluticasone  (FLONASE ) 50 MCG/ACT nasal spray 1 spray  1 spray Each Nare Daily Sundil, Subrina, MD   1 spray at 10/21/23 4098   fluticasone  furoate-vilanterol (BREO ELLIPTA ) 200-25 MCG/ACT 1 puff   1 puff Inhalation Daily Sundil, Subrina, MD   1 puff at 10/21/23 1191   gabapentin  (NEURONTIN ) capsule 100 mg  100 mg Oral TID PRN Lissa Riding, NP   100 mg at 10/20/23 2127   glucagon  (human recombinant) (GLUCAGEN) injection 1 mg  1 mg Intravenous PRN Amin, Ankit C, MD       haloperidol  lactate (HALDOL ) injection 1 mg  1 mg Intravenous Q6H PRN Sundil, Subrina, MD   1 mg at 10/19/23 2324   haloperidol  lactate (HALDOL ) injection 2 mg  2 mg Intravenous Once Daniels, James K, NP       hydrALAZINE  (APRESOLINE ) injection 10 mg  10 mg Intravenous Q4H PRN Amin, Ankit C, MD   10 mg at 10/09/23 0358   ipratropium-albuterol  (DUONEB) 0.5-2.5 (3) MG/3ML nebulizer solution 3 mL  3 mL Nebulization Q4H PRN Amin, Ankit C, MD       lisinopril  (ZESTRIL ) tablet 20 mg  20 mg Oral Daily Ogbata, Sylvester I, MD   20 mg at 10/21/23 0825   LORazepam  (ATIVAN ) injection 1 mg  1 mg Intravenous Once Amin, Ankit C, MD       methocarbamol  (ROBAXIN ) tablet 500 mg  500 mg Oral Q6H PRN Sundil, Subrina, MD   500 mg at 10/20/23 2127   metoprolol  tartrate (LOPRESSOR ) injection 5 mg  5 mg Intravenous Q4H PRN Amin, Ankit C, MD   5 mg at 10/08/23 1736   OLANZapine  (ZYPREXA ) tablet 7.5 mg  7.5 mg Oral QHS Lissa Riding, NP   7.5 mg at 10/20/23 2127   ondansetron  (ZOFRAN ) tablet 4 mg  4 mg Oral Q6H PRN Sundil, Subrina, MD       Or   ondansetron  (ZOFRAN ) injection 4 mg  4 mg Intravenous Q6H PRN Sundil, Subrina, MD   4 mg at 10/11/23 1423   pantoprazole  (PROTONIX ) EC tablet 40 mg  40 mg Oral Daily Amin, Ankit C, MD   40 mg at 10/21/23 0825   polyethylene glycol (MIRALAX  / GLYCOLAX ) packet 17 g  17 g Oral BID Sundil, Subrina, MD   17 g at 10/21/23 0825   senna-docusate (Senokot-S) tablet 1 tablet  1 tablet Oral QHS PRN Amin, Ankit C, MD       sodium chloride  flush (NS) 0.9 % injection 3 mL  3 mL Intravenous Q12H Sundil, Subrina, MD   3 mL at 10/21/23 0826   sodium chloride  flush (NS) 0.9 % injection 3 mL  3 mL Intravenous Q12H Sundil,  Subrina, MD   3 mL at 10/21/23 0826   sodium chloride  flush (NS) 0.9 % injection 3 mL  3 mL Intravenous PRN Sundil, Subrina, MD         Discharge Medications:  Please see discharge summary for a list of discharge medications. Medication List       STOP taking these medications     sertraline  100 MG tablet Commonly known as: ZOLOFT     triamterene -hydrochlorothiazide  37.5-25 MG tablet Commonly known as: MAXZIDE -25           TAKE these medications     acetaminophen  500 MG tablet Commonly known as: TYLENOL  Take 1,000 mg by mouth 3 (three) times daily.    Tylenol  325 MG tablet Generic drug: acetaminophen  Take 325 mg by mouth 2 (two) times daily as needed (for pain).    Advair Diskus 500-50 MCG/ACT Aepb Generic drug: fluticasone -salmeterol Inhale 1 puff into the lungs See admin instructions. Inhale 1 puff into the lungs twice a day and rinse mouth afterwards- with every use    albuterol  108 (90 Base) MCG/ACT inhaler Commonly known as: VENTOLIN  HFA Inhale 2 puffs into the lungs every 6 (six) hours as needed for wheezing or shortness of breath.    atorvastatin  20 MG tablet Commonly known as: LIPITOR Take 20 mg by mouth daily.    Biofreeze Cool The Pain 4 % Gel Generic drug: Menthol  (Topical Analgesic) Apply 1 application  topically 4 (four) times daily as needed (for pain or discomfort- affected joints).    carvedilol  6.25 MG tablet Commonly known as: COREG  Take 1 tablet (6.25 mg total) by mouth 2 (two) times daily with a meal.    cetirizine 10 MG tablet Commonly known as: ZYRTEC Take 10 mg by mouth daily.    escitalopram  10 MG tablet Commonly known as: LEXAPRO  Take 10 mg by mouth in the morning.    fluticasone  50 MCG/ACT nasal spray Commonly known as: FLONASE  Place 1 spray into both nostrils daily.    lisinopril  20 MG tablet Commonly known as: ZESTRIL  Take 1 tablet (20 mg total) by mouth daily. Start taking on: Oct 22, 2023    methocarbamol  500 MG  tablet Commonly known as: ROBAXIN  Take 1 tablet (500 mg total) by mouth every 6 (six) hours as needed for muscle spasms. What changed:  when to take this reasons to take this    nitroGLYCERIN  0.4 MG SL tablet Commonly known as: NITROSTAT  Place 0.4 mg under the tongue every 5 (five) minutes as needed for chest pain.    OLANZapine  7.5 MG tablet Commonly known as: ZYPREXA  Take 1 tablet (7.5 mg total) by mouth at bedtime.    omeprazole  20 MG capsule Commonly known as: PRILOSEC Take 20 mg by mouth daily.    polyethylene glycol 17 g packet Commonly known as: MIRALAX  / GLYCOLAX  Take 17 g by mouth 2 (two) times daily.    traMADol  50 MG tablet Commonly known as: Ultram  Take 1 tablet (50 mg total) by mouth every 6 (six) hours as needed for severe pain (pain score 7-10). What changed:  when to take this reasons to take this   Relevant Imaging Results:  Relevant Lab Results:   Additional Information SS# [id number]  Kathryn Parish, RN

## 2023-10-21 NOTE — TOC Progression Note (Addendum)
 Transition of Care [facility]) - Progression Note    Patient Details  Name: Jacqueline Foley MRN: [id number] Date of Birth: [birth year]  [facility]) CM/SW Contact  Kathryn Parish, RN Phone Number: 10/21/2023, 8:38 AM  Clinical Narrative:    NCM spoke with pt daughter to update on PASRR and plan for discharge. NCM called patients daughter to inform her that PTAR transportation will be late into the evening. Pt is number 35 on PTAR list. NCM called [facility] and will ouly transport for Hospice. Offered the daughter the opportunity to transport the patient. Daughter did not think it was a good idea and will wait to evaluate later.  Expected Discharge Plan: Home w Home Health Services Barriers to Discharge: Continued Medical Work up  Expected Discharge Plan and Services In-house Referral: Chaplain Discharge Planning Services: CM Consult Post Acute Care Choice: Skilled Nursing Facility [facility]) Living arrangements for the past 2 months: Assisted Living Facility [facility])                 DME Arranged: N/A DME Agency: NA       HH Arranged: NA HH Agency: NA         Social Determinants of Health (SDOH) Interventions SDOH Screenings   Food Insecurity: No Food Insecurity (10/06/2023)  Housing: Low Risk  (10/06/2023)  Transportation Needs: No Transportation Needs (10/06/2023)  Utilities: Not At Risk (10/06/2023)  Social Connections: Socially Isolated (10/06/2023)  Tobacco Use: Low Risk  (10/06/2023)    Readmission Risk Interventions    10/07/2023    2:59 PM 08/14/2023    8:41 AM  Readmission Risk Prevention Plan  Transportation Screening Complete Complete  PCP or Specialist Appt within 5-7 Days Complete Complete  Home Care Screening Complete Complete  Medication Review (RN CM) Complete Complete

## 2023-10-22 DIAGNOSIS — R4182 Altered mental status, unspecified: Secondary | ICD-10-CM | POA: Diagnosis not present

## 2023-10-22 DIAGNOSIS — G9341 Metabolic encephalopathy: Secondary | ICD-10-CM | POA: Diagnosis not present

## 2023-10-22 DIAGNOSIS — N3000 Acute cystitis without hematuria: Secondary | ICD-10-CM | POA: Diagnosis not present

## 2023-10-22 NOTE — Plan of Care (Addendum)
 Report given to Lorelee Roger, nurse taking over for when pt transfers to [place] [address], room [address], at [facility].  IV accesses discontinued.  Pt has all belongings.  [facility] from [place] to [place] called and confirmed.  ------- Daughter [name] updated and aware of pt transporting today

## 2023-10-22 NOTE — Progress Notes (Addendum)
 [facility]                                                                              Jacqueline Foley, is a 89 y.o. female, DOB - 19-Apr-1933, ZOX:[id number] Admit date - 10/06/2023    Outpatient Primary MD for the patient is [name], DO  LOS - 16  days  No chief complaint on file.      Brief summary   Patient is a 89 year old female with anxiety, depression, dementia, left hip fracture status post IM nailing 08/15/2023, CKD 3a, HTN, HLD comes to the ED for evaluation of altered mental status/paranoia and delusion.  Patient was found to have UTI, placed on IV Rocephin .   Blood cultures grew Streptococcus mitis and this was hard to distinguish if this was a contaminant. Reviewed with ID. Echocardiogram is negative, no further workup   5/24: Discharge was held yesterday evening due to delay in transportation.  Patient is medically ready to be transferred to SNF today Discharge summary done by Dr. Darlyn Eke yesterday 5/23, stillstands, no changes.  Assessment & Plan   Mild acute kidney injury superimposed on CKD stage IIIa  - Baseline creatinine 0.9 - Creatinine trended up to 1.02 on 5/14 - Creatinine now at baseline   Acute UTI -Urine culture showed pansensitive E. coli -  has completed 5-day course of cefazolin    Streptococcus mitis bacteremia -Dr Noelle Batten discussed with ID, 2D echo negative for any vegetations.  Repeat blood cultures negative to date, no further workup - SLP evaluation completed, recommended chopped diet with thin liquids   HTN -continue coreg , lisinopril  -Triamterene  HCTZ discontinued   Dementia  Paranoid behavior/delusions and hallucinations Acute metabolic encephalopathy, likely due to UTI Continue Zyprexa , Lexapro  and gabapentin  per psych Further workup including CTA, MRI, TSH and B12 are negative -Plan to taper Zyprexa , once established at SNF, please taper to olanzapine  5 mg nightly from 7.5. After one more week, please  taper off.     Reactive airway disease Stable.  As needed bronchodilators    Left proximal fever intertrochanteric fracture secondary to fall -Status post IM nailing by Dr. Bernard Brick 08/15/2023.   -PT/OT, awaiting SNF    Estimated body mass index is 20.27 kg/m as calculated from the following:   Height as of this encounter: 5\' 7"  (1.702 m).   Weight as of this encounter: 58.7 kg.  Code Status: Full code DVT Prophylaxis:  enoxaparin  (LOVENOX ) injection 40 mg Start: 10/09/23 1000 SCDs Start: 10/06/23 2251 Place TED hose Start: 10/06/23 2251   Level of Care: Level of care: Med-Surg Disposition Plan:      Remains inpatient appropriate: Medically stable to discharge to SNF today    Antimicrobials:   Anti-infectives (From admission, onward)    Start     Dose/Rate Route Frequency Ordered Stop   10/08/23 1400  ceFAZolin  (ANCEF ) IVPB 2g/100 mL premix  Status:  Discontinued        2 g 200 mL/hr over 30 Minutes Intravenous Every 8 hours 10/08/23 1228 10/12/23 1517   10/07/23 2300  cefTRIAXone  (ROCEPHIN ) 1 g in sodium chloride  0.9 % 100 mL IVPB  Status:  Discontinued  1 g 200 mL/hr over 30 Minutes Intravenous Every 24 hours 10/06/23 2250 10/08/23 1228   10/06/23 2230  cefTRIAXone  (ROCEPHIN ) 2 g in sodium chloride  0.9 % 100 mL IVPB        2 g 200 mL/hr over 30 Minutes Intravenous  Once 10/06/23 2226 10/07/23 0006          Medications  atorvastatin   20 mg Oral Daily   carvedilol   6.25 mg Oral BID WC   enoxaparin  (LOVENOX ) injection  40 mg Subcutaneous Q24H   escitalopram   10 mg Oral Daily   fluticasone   1 spray Each Nare Daily   fluticasone  furoate-vilanterol  1 puff Inhalation Daily   haloperidol  lactate  2 mg Intravenous Once   lisinopril   20 mg Oral Daily   LORazepam   1 mg Intravenous Once   OLANZapine   7.5 mg Oral QHS   pantoprazole   40 mg Oral Daily   polyethylene glycol  17 g Oral BID   sodium chloride  flush  3 mL Intravenous Q12H   sodium chloride  flush  3 mL  Intravenous Q12H      Subjective:   Jacqueline Foley was seen and examined today.  No acute issues.  Discharge held yesterday due to delay in transportation.  Currently resting comfortably.   Denies any specific complaints.  Objective:   Vitals:   10/21/23 1935 10/22/23 0100 10/22/23 0528 10/22/23 0528  BP: 100/65  (!) 125/91 (!) 125/91  Pulse: 91  81 81  Resp: 17  14 14   Temp: 98.6 F (37 C)  98 F (36.7 C) 98 F (36.7 C)  TempSrc: Oral  Oral Oral  SpO2: 99%  99% 99%  Weight:  58.7 kg    Height:  5\' 7"  (1.702 m)      Intake/Output Summary (Last 24 hours) at 10/22/2023 0756 Last data filed at 10/21/2023 1700 Gross per 24 hour  Intake 1200 ml  Output --  Net 1200 ml     Wt Readings from Last 3 Encounters:  10/22/23 58.7 kg  08/15/23 64 kg  07/19/23 61.7 kg   Physical Exam General: Alert and awake, NAD, appears comfortable Cardiovascular: S1 S2 clear, RRR.  Respiratory: CTAB, no wheezing Gastrointestinal: Soft, nontender, nondistended, NBS Ext: no pedal edema bilaterally Neuro: no new deficits Psych: dementia     Data Reviewed:  I have personally reviewed following labs    CBC Lab Results  Component Value Date   WBC 4.9 10/18/2023   RBC 3.41 (L) 10/18/2023   HGB 9.7 (L) 10/18/2023   HCT 31.3 (L) 10/18/2023   MCV 91.8 10/18/2023   MCH 28.4 10/18/2023   PLT 267 10/18/2023   MCHC 31.0 10/18/2023   RDW 13.9 10/18/2023   LYMPHSABS 2.0 10/06/2023   MONOABS 0.5 10/06/2023   EOSABS 0.2 10/06/2023   BASOSABS 0.1 10/06/2023     Last metabolic panel Lab Results  Component Value Date   NA 141 10/18/2023   K 4.0 10/18/2023   CL 107 10/18/2023   CO2 27 10/18/2023   BUN 22 10/18/2023   CREATININE 0.74 10/18/2023   GLUCOSE 100 (H) 10/18/2023   GFRNONAA >60 10/18/2023   GFRAA >60 11/09/2017   CALCIUM  9.1 10/18/2023   PHOS 3.3 10/08/2023   PROT 6.4 (L) 10/18/2023   ALBUMIN 3.2 (L) 10/18/2023   LABGLOB 2.8 12/01/2022   BILITOT 0.5 10/18/2023    ALKPHOS 79 10/18/2023   AST 23 10/18/2023   ALT 18 10/18/2023   ANIONGAP 7 10/18/2023    CBG (  last 3)  No results for input(s): "GLUCAP" in the last 72 hours.    Coagulation Profile: No results for input(s): "INR", "PROTIME" in the last 168 hours.   Radiology Studies: I have personally reviewed the imaging studies  No results found.     Bertram Brocks M.D. [facility] 10/22/2023, 7:56 AM  Available via Epic secure chat 7am-7pm After 7 pm, please refer to night coverage provider listed on amion.

## 2023-10-22 NOTE — Progress Notes (Signed)
 PT had transportation arranged to SNF, however, transport was delayed to a late hour. At aprproximately 21:30, Jacqueline Foley was contacted and PT was still in line for transport. Pt's daughter, [name], requested to cancel nighttime transport due to concerns about her mother's sundowning and preferred daytime transfer instead. Hospitalist notified

## 2023-10-22 NOTE — TOC Transition Note (Signed)
 Transition of Care [facility]) - Discharge Note   Patient Details  Name: Jacqueline Foley MRN: [id number] Date of Birth: 04/23/1933  [facility] CM/SW Contact:  Levie Ream, RN Phone Number: 10/22/2023, 9:47 AM   Clinical Narrative:    D/C orders received; pt d/c to [facility]; previously assigned to [address] [place], [place] # [id number]; call report # [phone]; spoke w/ Lorelee Roger at facility; she says report has been called; pt transport by Lyna Sandhoff; spoke w/ operator # [phone]; he confirmed transport previously called and en route to pt's room; pt's dtr Curt Dover [phone]) notified and agrees to d/c plan; no TOC needs.   Final next level of care: Skilled Nursing Facility Barriers to Discharge: No Barriers Identified   Patient Goals and CMS Choice Patient states their goals for this hospitalization and ongoing recovery are:: SNF [facility]   Choice offered to / list presented to : Adult Children      Discharge Placement   Existing PASRR number confirmed : 10/21/23          Patient [facility] bed at: [facility] Patient to be transferred to facility by: PTAR Name of family member notified: Curt Dover (dtr) [phone] Patient and family notified of of transfer: 10/22/23  Discharge Plan and Services Additional resources added to the After Visit Summary for   In-house Referral: Chaplain Discharge Planning Services: CM Consult Post Acute Care Choice: Skilled Nursing Facility [facility])          DME Arranged: N/A DME Agency: NA       HH Arranged: NA HH Agency: NA        Social Drivers of Health (SDOH) Interventions SDOH Screenings   Food Insecurity: No Food Insecurity (10/06/2023)  Housing: Low Risk  (10/06/2023)  Transportation Needs: No Transportation Needs (10/06/2023)  Utilities: Not At Risk (10/06/2023)  Social Connections: Socially Isolated (10/06/2023)  Tobacco Use: Low Risk  (10/06/2023)     Readmission Risk Interventions    10/07/2023     2:59 PM 08/14/2023    8:41 AM  Readmission Risk Prevention Plan  Transportation Screening Complete Complete  PCP or Specialist Appt within 5-7 Days Complete Complete  Home Care Screening Complete Complete  Medication Review (RN CM) Complete Complete

## 2023-10-25 NOTE — Telephone Encounter (Signed)
 I called and spoke to daughter of pt on DPR.  Pt was discharged Saturday to rehab[facility] [place].  She will meet with care team Thursday.  She stated that cardiology cleared her.  She had been in and out of hospital in March - hip facture and then May UTI.  I relayed Dr. Tricia Fuelling message that illness, surgery , can definitely make a difference in cognition.  (Plus multiple living arrangements) can add to that.  Recommend that she is stable medically and living arrangements prior to seeing her for her memory.  Appt made 03-14-2024 at 1430. Placed on waitlist. At this time SocWorker on floor, [name] [phone], and [name] DOA [phone].  Contact Cheri-daughter for infor. She appreciated information.

## 2023-12-03 DIAGNOSIS — M25569 Pain in unspecified knee: Secondary | ICD-10-CM | POA: Insufficient documentation

## 2023-12-03 DIAGNOSIS — M25562 Pain in left knee: Secondary | ICD-10-CM | POA: Insufficient documentation

## 2023-12-03 DIAGNOSIS — M1712 Unilateral primary osteoarthritis, left knee: Secondary | ICD-10-CM | POA: Insufficient documentation

## 2024-02-23 ENCOUNTER — Ambulatory Visit: Payer: Self-pay | Admitting: Neurology

## 2024-03-02 ENCOUNTER — Telehealth: Payer: Self-pay | Admitting: Cardiology

## 2024-03-02 NOTE — Telephone Encounter (Signed)
 Spoke with daughter and she has been admitted to hospital multiple times since last visit  Currently at facility and had recent chest xray which indicated possible HF per daughter Has been having swelling even with compression socks  Was prescribed Lasix  Having issues with her blood pressure being elevated Daughter is concerned about both of these   Scheduled her appointment for Tuesday with Reche ORN NP   Advised daughter to get Xray, last progress note, and labs from facility

## 2024-03-02 NOTE — Telephone Encounter (Signed)
 Pt daughter called in asking to speak with nurse because PCP told them pt was going into congestive heart failure and prescribed her lasix. Please advise.

## 2024-03-06 ENCOUNTER — Ambulatory Visit (HOSPITAL_BASED_OUTPATIENT_CLINIC_OR_DEPARTMENT_OTHER): Admitting: Family

## 2024-03-06 ENCOUNTER — Encounter (HOSPITAL_BASED_OUTPATIENT_CLINIC_OR_DEPARTMENT_OTHER): Payer: Self-pay | Admitting: Family

## 2024-03-06 VITALS — BP 136/84 | HR 74 | Ht 67.0 in

## 2024-03-06 DIAGNOSIS — I10 Essential (primary) hypertension: Secondary | ICD-10-CM

## 2024-03-06 DIAGNOSIS — E782 Mixed hyperlipidemia: Secondary | ICD-10-CM | POA: Diagnosis not present

## 2024-03-06 DIAGNOSIS — R600 Localized edema: Secondary | ICD-10-CM

## 2024-03-06 MED ORDER — CARVEDILOL 25 MG PO TABS: 25.0000 mg | ORAL_TABLET | Freq: Two times a day (BID) | ORAL | Status: AC

## 2024-03-06 NOTE — Progress Notes (Unsigned)
  Cardiology Office Note   Date:  03/06/2024  ID:  Jacqueline Foley, DOB 07-21-1932, MRN [id number] PCP: Jacqueline Motto, DO  [facility] Providers Cardiologist:  Jacqueline Bruckner, MD { Click to update primary MD,subspecialty MD or APP then REFRESH:1}    History of Present Illness Jacqueline Foley is a 89 y.o. female ***hypertension, aortic atherosclerosis, hx of TIA, HLD  Echo 09/2023 LVEF 60-65%, indeterminate diastolic dysfunction   Recent CXR Now residing at a facility. '  Bilateral LE edema  Swelling L>R. Has not been weight bearing on that side. Compression sock does not helped much but on review her compression sock is loose on her foot. The swelling does worsen by the end of the day or if she is up on it. She is in wheelchair most of the day.   Tells me she is short of breath a little.   Atorvastatin  20mg  daily Diltiazem 120mg  daily Carvedilol  25mg  BID Furosemide 20mg  daily x 3 days ***  02/21/24 creatinine 0.87, K 4.6, GFR 83, Hb 10.6  [facility]  10/06/23 Stopped maxzide  Smlodipine, carvedilol   Some lightheaded -   Sodium intake  BP goal <140/90***   ROS: Please see the history of present illness.    All other systems reviewed and are negative.   Studies Reviewed      *** Risk Assessment/Calculations           Physical Exam VS:  BP 136/84   Pulse 74   Ht 5' 7 (1.702 m)   SpO2 97%   BMI 20.27 kg/m        Wt Readings from Last 3 Encounters:  10/22/23 129 lb 6.6 oz (58.7 kg)  08/15/23 141 lb 1.5 oz (64 kg)  07/19/23 136 lb (61.7 kg)    GEN: Well nourished, well developed in no acute distress NECK: No JVD; No carotid bruits CARDIAC: RRR, no murmurs, rubs, gallops RESPIRATORY:  Clear to auscultation without rales, wheezing or rhonchi  ABDOMEN: Soft, non-tender, non-distended EXTREMITIES:  No edema; No deformity   ASSESSMENT AND PLAN ***       Dispo: follow up in 3 months  Signed, Jacqueline Foley Finder, NP

## 2024-03-06 NOTE — Patient Instructions (Addendum)
 Medication Instructions:   STOP Diltiazem  CONTINUE Carvedilol  25mg  twice daily  CONTINUE Atorvastatin  20mg  daily  *If you need a refill on your cardiac medications before your next appointment, please call your pharmacy*  Follow-Up: At [facility], you and your health needs are our priority.  As part of our continuing mission to provide you with exceptional heart care, our providers are all part of one team.  This team includes your primary Cardiologist (physician) and Advanced Practice Providers or APPs (Physician Assistants and Nurse Practitioners) who all work together to provide you with the care you need, when you need it.  Your next appointment:   3 month(s)  Provider:   Shelda Bruckner, MD, Rosaline Bane, NP, or Reche Finder, NP    We recommend signing up for the patient portal called MyChart.  Sign up information is provided on this After Visit Summary.  MyChart is used to connect with patients for Virtual Visits (Telemedicine).  Patients are able to view lab/test results, encounter notes, upcoming appointments, etc.  Non-urgent messages can be sent to your provider as well.   To learn more about what you can do with MyChart, go to [email or web address].   Other Instructions  Recommend resumption of physical therapy services  Recommend new compression stockings that provide adequate compression 15-20 mmHg to be worn during daytime hours. Please assist [name] to place the compression stockings daily.   Recommend leg elevation during the day to help with swelling.   Recommend low sodium (000mg ) diet.   Recommend BP goal <140/80.   Please check blood pressure once per day and keep a log. In one week fax BP results to Reche GORMAN Finder, NP at [phone].

## 2024-03-08 ENCOUNTER — Telehealth: Payer: Self-pay | Admitting: Neurology

## 2024-03-08 ENCOUNTER — Encounter: Payer: Self-pay | Admitting: Neurology

## 2024-03-08 ENCOUNTER — Ambulatory Visit: Admitting: Neurology

## 2024-03-08 VITALS — BP 124/86 | HR 72 | Ht 67.0 in | Wt 140.2 lb

## 2024-03-08 DIAGNOSIS — F02B2 Dementia in other diseases classified elsewhere, moderate, with psychotic disturbance: Secondary | ICD-10-CM

## 2024-03-08 DIAGNOSIS — G14 Postpolio syndrome: Secondary | ICD-10-CM | POA: Insufficient documentation

## 2024-03-08 DIAGNOSIS — R1312 Dysphagia, oropharyngeal phase: Secondary | ICD-10-CM | POA: Diagnosis not present

## 2024-03-08 DIAGNOSIS — G2111 Neuroleptic induced parkinsonism: Secondary | ICD-10-CM | POA: Diagnosis not present

## 2024-03-08 DIAGNOSIS — G301 Alzheimer's disease with late onset: Secondary | ICD-10-CM | POA: Insufficient documentation

## 2024-03-08 DIAGNOSIS — S72002D Fracture of unspecified part of neck of left femur, subsequent encounter for closed fracture with routine healing: Secondary | ICD-10-CM

## 2024-03-08 NOTE — Progress Notes (Signed)
 Provider:  Dedra Gores, MD  Primary Care Physician:  Dayna Motto, DO [address] [place] [address]     Referring Provider: Dr Ines Paul , MD         Chief Complaint according to patient   Patient presents with:                HISTORY OF PRESENT ILLNESS:  TODAY IS HER 89th B day.   Jacqueline Foley is a 89 y.o. female patient of dr Sharion who is here for revisit 03/08/2024 for  memory evaluation. . Patient is here for dementia follow-up - slow EEG, has had URI and UTI s and is again on antibiotcs.  Was depressed, had hallucinations in may 2025.  The patient fell and fractured hip at [place] - needed rehab,  she is currently at [facility] in [facility], Blood pressure and heart rate have been too low at times, not a candidate for aricpet.   She had positive Amyloid PET and AT markers for AD.     MMSE - 17/ 30 , moderate severity of dementia.      03/08/2024   10:52 AM 12/01/2022    8:09 AM  MMSE - Mini Mental State Exam  Orientation to time 2 3  Orientation to Place 4 5  Registration 3 3  Attention/ Calculation 0 1  Recall 1 1  Language- name 2 objects 2 2  Language- repeat 0 1  Language- follow 3 step command 3 3  Language- read & follow direction 1 1  Write a sentence 1 1  Copy design 0 0  Total score 17 21      Depression Scale - 4/ 15.    Chief concern according to patient's daughter :  She is not getting out enough but she is less depressed, more stimulated since at [facility].    5.22.2025: Dr Sharion last note:   Pt was discharged Saturday to rehab, [facility] in [place].  She will meet with care team Thursday.  She stated that cardiology cleared her.  She had been in and out of hospital in March - hip facture and then May UTI.  I relayed Dr. Avelina message that illness, surgery , can definitely make a difference in cognition.  (Plus multiple living arrangements) can add to that.  Recommend  that she is stable medically and living arrangements prior to seeing her for her memory.  Appt made 03-14-2024 at 1430. Placed on waitlist. At this time SocWorker on floor, [name] [phone], and [name] DOA [phone].  Contact Cheri-daughter for infor. She appreciated information.      Message from [name] sent at 01/31/2023  3:40 PM EDT ----- She does not have an alzheimer's gene. Unfortunately she haspositive markers in the blood for alzheimers disease as well as some mild amyloid plaque in the brain. This is not entriely diagnostic of alzheimer's but concerning for alzheimer's disease. I see her in early September can discuss woith her then thanks      EEG was abnormal. 03/2023 reviewed ED notes:   Patient was getting of a car and according to her family who is at her side patient slumped to the ground but did not suffer any injury.  They noted that she had no change in her respirations.  No change in color of her face.  States that she had some shaking her eyes were open.  For between  5 and 10 minutes she would not respond to the voice.  Patient slowly came back to normal.. EKG 10/12 QTC 503. There is no SSRI that does not cause prolonged QTc. Needs to see cardiology. Has not had another episode. No more dizziness. She gets dizzy when standing.        Addendum: UTI came back positive and we treated with cipro . Also She does not have an alzheimer's gene but she did have markers in her blood that can be consistent with alzheimer's disease. The next step would be to perform a PET Amyloid (in addition to the MRI brain) to see if she has the alzheimer's protein in the brain which, if positive, makes alzheimer's the likely conclusion. Ask if they would like this thanks - asked nurse to call daughter Dr Ines 12/09/2022   HPI: 12/01/2022:  Jacqueline Foley is a 89 y.o. female here as requested by Dayna Motto, DO for memory. has Precordial chest pain; Chest pain at rest,  negative MI, negative stress test, may have been GI; Essential hypertension; DOE (dyspnea on exertion), may be due to HTN; Lumbar radiculopathy; Chest pain; History of chest pain; and Aortic atherosclerosis (HCC) on their problem list.  She also has hypertension, left leg pain, gait disturbance, hyperlipidemia, Alzheimer's disease, anxiety, insomnia, postpolio syndrome, dysphagia, osteoporosis, asthma, chronic kidney disease arthritis and pain and headaches on her problem list when reviewing primary care notes.  Saw patient back in 2019 for similar symptoms.  She does have a history of dementia with psychiatric disturbance.  She has been on sertraline  for a long time but low-dose.  I reviewed my notes from 2019, when we saw her she was already having paranoid delusions that someone is breaking into her apartment and moving things, her daughter had placed cameras in and around the house and did not see anybody.  She is here again with her daughter.  She has been having memory problems for many years.  It is more short-term memory and progressive.  She lives in senior living situation.  She has not been driving for years.  Ongoing and progressive since at least 2017.  She does have a history of dementia in her maternal aunt.  Remote memory is still okay but progressive short-term memory and behavioral symptoms.  Daughter provides most info. No falls.    Patient complains of symptoms per HPI as well as the following symptoms: Dementia. Pertinent negatives and positives per HPI. All others negative.  2019 : Precordial chest pain; Chest pain at rest, negative MI, negative stress test, may have been GI; Essential hypertension; DOE (dyspnea on exertion), may be due to HTN; Lumbar radiculopathy; Chest pain; History of chest pain; and Aortic atherosclerosis (HCC) on their problem list.  I reviewed primary care notes, problem list includes hypertension, left leg pain, gait disturbance, hyperlipidemia, lumbar disc,  Alzheimer's disease, left leg weakness, anxiety, insomnia, PMR, GERD, postpolio syndrome, dysphagia, osteoporosis, mild persistent asthma with exacerbation, stage IIIa chronic kidney disease, arthritis and pain, headaches.  She was sent here today for dementia with psychiatric disturbance, they started sertraline  at last appointment, having paranoid delusions that someone was breaking into her apartment and moving things, her daughter placed cameras in the apartment and no one was entering but the patient, explained she thinks the tremors are defective and not seeing the person or the person is able to sneak around without the camera picking them up.  This has not changed since starting the sertraline .  Note states she had  a 14 out of 30 on the [facility] cognitive assessment test.  She had a CT in the middle of 2022 for intermittent confusion which did not show any acute abnormalities.  No focal deficits on physical exam.  We have seen her in the past for other chief complaints but not for over 5 years.  Last labs taken were 02/17/2022 and her CMP appeared unremarkable with BUN 21 and creatinine 1.08, which showed a slight decrease in GFR of 50, her CBC was unremarkable,            Review of Systems: Out of a complete 14 system review, the patient complains of only the following symptoms, and all other reviewed systems are negative.:   ADL;   Feeds herself, but has dysphagia. Aspiration.   Limited ambulation: since hip fracture. Pain with standing . Toileting. Not incontinent. Sleeps 6 hours or more, no naps.  Normal Apetite.  No longer driving for 14 years. Banking: not able.  Communication by mail, writing, never used email  : limited.    Social History   Socioeconomic History   Marital status: Divorced    Spouse name: Not on file   Number of children: 6   Years of education: 12   Highest education level: Not on file  Occupational History   Not on file  Tobacco Use   Smoking status:  Never   Smokeless tobacco: Never  Vaping Use   Vaping status: Never Used  Substance and Sexual Activity   Alcohol use: Not Currently   Drug use: No   Sexual activity: Not on file  Other Topics Concern   Not on file  Social History Narrative   Lives at home alone   Right handed   1 cup of caffeine daily   Social Drivers of Health   Financial Resource Strain: Not on file  Food Insecurity: No Food Insecurity (10/06/2023)   Hunger Vital Sign    Worried About Running Out of Food in the Last Year: Never true    Ran Out of Food in the Last Year: Never true  Transportation Needs: No Transportation Needs (10/06/2023)   PRAPARE - Administrator, Civil Service (Medical): No    Lack of Transportation (Non-Medical): No  Physical Activity: Not on file  Stress: Not on file  Social Connections: Socially Isolated (10/06/2023)   Social Connection and Isolation Panel    Frequency of Communication with Friends and Family: Twice a week    Frequency of Social Gatherings with Friends and Family: Once a week    Attends Religious Services: Never    Database administrator or Organizations: No    Attends Engineer, structural: Never    Marital Status: Divorced    Family History  Problem Relation Age of Onset   Diabetes Mother    Heart disease Mother    Hypertension Mother    Prostate cancer Father     Past Medical History:  Diagnosis Date   Chest pain at rest, negative MI, negative stress test, may have been GI 12/06/2013   Diabetes mellitus without complication (HCC)    DOE (dyspnea on exertion), may be due to HTN 12/06/2013   GERD (gastroesophageal reflux disease)    H/O diabetes mellitus    HTN (hypertension) 12/06/2013   Hypercholesteremia    Hypertension    TIA (transient ischemic attack)     Past Surgical History:  Procedure Laterality Date   CATARACT EXTRACTION, BILATERAL  2018   EYE  SURGERY     FEMUR IM NAIL Left 08/15/2023   Procedure: INSERTION, INTRAMEDULLARY  ROD, FEMUR;  Surgeon: Ernie Cough, MD;  Location: [facility];  Service: Orthopedics;  Laterality: Left;   FOOT SURGERY       Current Outpatient Medications on File Prior to Visit  Medication Sig Dispense Refill   acetaminophen  (TYLENOL ) 500 MG tablet Take 1,000 mg by mouth 3 (three) times daily.     ADVAIR DISKUS 500-50 MCG/ACT AEPB Inhale 1 puff into the lungs See admin instructions. Inhale 1 puff into the lungs twice a day and rinse mouth afterwards- with every use     albuterol  (PROVENTIL  HFA;VENTOLIN  HFA) 108 (90 Base) MCG/ACT inhaler Inhale 2 puffs into the lungs every 6 (six) hours as needed for wheezing or shortness of breath.     atorvastatin  (LIPITOR) 20 MG tablet Take 20 mg by mouth daily.     cetirizine (ZYRTEC) 10 MG tablet Take 10 mg by mouth daily.     EPINEPHrine 0.3 mg/0.3 mL IJ SOAJ injection Inject 0.3 mg into the muscle as needed for anaphylaxis.     escitalopram  (LEXAPRO ) 10 MG tablet Take 10 mg by mouth in the morning.     fluticasone  (FLONASE ) 50 MCG/ACT nasal spray Place 1 spray into both nostrils daily.     nitroGLYCERIN  (NITROSTAT ) 0.4 MG SL tablet Place 0.4 mg under the tongue every 5 (five) minutes as needed for chest pain.     omeprazole  (PRILOSEC) 20 MG capsule Take 20 mg by mouth daily.     polyethylene glycol (MIRALAX  / GLYCOLAX ) 17 g packet Take 17 g by mouth 2 (two) times daily. 14 each 0   sertraline  (ZOLOFT ) 50 MG tablet Take 50 mg by mouth daily.     traMADol  (ULTRAM ) 50 MG tablet Take 1 tablet (50 mg total) by mouth every 6 (six) hours as needed for severe pain (pain score 7-10). (Patient taking differently: Take 25 mg by mouth every 6 (six) hours as needed for severe pain (pain score 7-10).) 12 tablet 0   TYLENOL  325 MG tablet Take 325 mg by mouth 2 (two) times daily as needed (for pain).     BIOFREEZE COOL THE PAIN 4 % GEL Apply 1 application  topically 4 (four) times daily as needed (for pain or discomfort- affected joints). (Patient not taking: Reported on  03/08/2024)     carvedilol  (COREG ) 25 MG tablet Take 1 tablet (25 mg total) by mouth 2 (two) times daily with a meal.     methocarbamol  (ROBAXIN ) 500 MG tablet Take 1 tablet (500 mg total) by mouth every 6 (six) hours as needed for muscle spasms. 40 tablet 2   OLANZapine  (ZYPREXA ) 7.5 MG tablet Take 1 tablet (7.5 mg total) by mouth at bedtime.     No current facility-administered medications on file prior to visit.    Allergies  Allergen Reactions   Tomato Swelling and Rash   Protonix  [Pantoprazole ] Diarrhea     DIAGNOSTIC DATA (LABS, IMAGING, TESTING) - I reviewed patient records, labs, notes, testing and imaging myself where available.  Lab Results  Component Value Date   WBC 4.9 10/18/2023   HGB 9.7 (L) 10/18/2023   HCT 31.3 (L) 10/18/2023   MCV 91.8 10/18/2023   PLT 267 10/18/2023      Component Value Date/Time   NA 141 10/18/2023 0839   NA 143 12/01/2022 0948   K 4.0 10/18/2023 0839   CL 107 10/18/2023 0839   CO2 27 10/18/2023 0839  GLUCOSE 100 (H) 10/18/2023 0839   BUN 22 10/18/2023 0839   BUN 16 12/01/2022 0948   CREATININE 0.74 10/18/2023 0839   CALCIUM  9.1 10/18/2023 0839   PROT 6.4 (L) 10/18/2023 0839   PROT 7.3 12/01/2022 0948   ALBUMIN 3.2 (L) 10/18/2023 0839   ALBUMIN 4.5 12/01/2022 0948   AST 23 10/18/2023 0839   ALT 18 10/18/2023 0839   ALKPHOS 79 10/18/2023 0839   BILITOT 0.5 10/18/2023 0839   BILITOT 0.4 12/01/2022 0948   GFRNONAA >60 10/18/2023 0839   GFRAA >60 11/09/2017 0428   Lab Results  Component Value Date   CHOL 174 12/01/2022   HDL 66 12/01/2022   LDLCALC 86 12/01/2022   TRIG 123 12/01/2022   CHOLHDL 2.6 12/01/2022   Lab Results  Component Value Date   HGBA1C 5.7 (H) 12/01/2022   Lab Results  Component Value Date   VITAMINB12 574 10/07/2023   Lab Results  Component Value Date   TSH 0.990 10/07/2023    PHYSICAL EXAM:  Vitals:   03/08/24 1038  BP: 124/86  Pulse: 72  SpO2: 98%   No data found. Body mass index is  21.96 kg/m.   Wt Readings from Last 3 Encounters:  03/08/24 140 lb 3.2 oz (63.6 kg)  10/22/23 129 lb 6.6 oz (58.7 kg)  08/15/23 141 lb 1.5 oz (64 kg)     Ht Readings from Last 3 Encounters:  03/08/24 5' 7 (1.702 m)  03/06/24 5' 7 (1.702 m)  10/22/23 5' 7 (1.702 m)      General: The patient is awake, alert and appears not in acute distress and groomed. Head: Normocephalic, atraumatic.  Neck is supple. Mallampati 3,  neck circumference:14.5  inches .   Nasal airflow  patent.   Overbite Dwan is  seen.  Dental status: dentures , not fitting well .  Cardiovascular:  Regular rate and cardiac rhythm by pulse, without distended neck veins. Respiratory: no shortness of breath  Skin:  Without evidence of ankle edema, or rash. Trunk: BMI is 22    NEUROLOGIC EXAM: The patient is awake and alert, oriented to place and time.   Memory subjective described as impaired  Attention span & concentration ability appears normal.   Speech is fluent,  without  dysarthria, dysphonia or aphasia.  Mood and affect are appropriate.   Neurological Examination: Cranial Nerves II-XII: Intact. Ora o facial dyskinesias, automatisms.  Not a tic.   PERL. EOMI. VFF. No nystagmus.  No facial droop.  No ptosis.  Hearing is grossly intact bilaterally.  The tongue is in midline, no tremor  Motor: significant cogwheel , over biceps and wrists -rigidly, unable to relax, unable to keep still.   Coordination: No ataxia or dysmetria.  Sensory: Grossly intact . Reflexes: Normal and symmetric throughout.  Gait and Station: deferred, wheelchair bound since hip fracture.   ASSESSMENT AND PLAN :   89 y.o. year old female  new patient to me, transfer of care for DEMENTIA from dr Ines, MD here with:    1) Alzheimer's type dementia, progressed to moderate degree , ATN and PET amyloid positive, MMSE < 20/ 30 points. I do wonder about overlap with LewyBodie dementia, see parkinsonian  rigor and  cogwheeling, and history of visual psychosis.  Hallucinations were related to infection and narcotic  medication in hospital.     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  Now off zyprexa .   Currently on tramadol   for hip pain.  Has not  been on Aricept  due to abnormal Qt time on ECG.   Conservative  treatment by stimulation in group activity, social interaction is brain jogging.   Outdoor , daylight exposure to help circadian rhythm.   Continue PT ,  she had been evaluated again for swallowing difficulties by [facility] / barium swallowing     I would like to thank Dayna Motto, DO and Dayna, Ryan, Do [address] [place],  [place] [address] for allowing me to meet with this pleasant patient.  The referring provider will be notified of the test results.   The patient's condition requires frequent monitoring and adjustments in the treatment plan, reflecting the ongoing complexity of care.  This provider is the continuing focal point for all needed services for this condition.  After spending a total time of  45  minutes face to face and time for  history taking, physical and neurologic examination, review of laboratory studies,  personal review of imaging studies, reports and results of other testing and review of referral information / records as far as provided in visit,   Electronically signed by: Dedra Gores, MD 03/08/2024 11:12 AM  [facility] Board certified by The [employer] of Sleep Medicine and Diplomate of the [employer] of Sleep Medicine. Board certified In Neurology through the ABPN, Fellow of the [employer] of Neu[employer].

## 2024-03-08 NOTE — Telephone Encounter (Signed)
 Referral to Physical therapy faxed to [facility] Phone:[phone] [place]; [phone]

## 2024-03-08 NOTE — Patient Instructions (Signed)
 Mindfulness-Based Stress Reduction: What to Know Mindfulness-based stress reduction (MBSR) is a mindfulness meditation program that normally takes place over 8 weeks. It usually includes weekly group classes and daily exercises to do at home. What are the benefits of MBSR? Mindfulness meditation therapies, like MBSR, can change a person's brain and body in good ways, and make them healthier. MBSR can have many benefits, such as: Helping to lower stress hormones. Decreasing symptoms or helping to deal with symptoms of different conditions, like: Anxiety, which is feeling worried or nervous. Long-lasting pain. This is pain that lasts more than 3 months. Stress and worry. Trouble sleeping. Headaches, like migraines and tension headaches. Irritable bowel syndrome. Helping to handle stress from things you can't control, like: Long-term illnesses, especially if you have a lot of pain or other difficult symptoms. Big life events. Stress at work. Stress from taking care of someone else. Types of MBSR exercises Mindfulness. This is a common type of meditation. Meditation. It helps you focus your mind to feel calm and happy. It has two main parts: paying attention and accepting. Paying attention means focusing on what is happening right now. This usually means noticing your breathing, your thoughts, how your body feels, and your emotions. Accepting means noticing these feelings and sensations without judging them. Instead of reacting to these thoughts or feelings, you just observe them and let them pass. MSBR exercises include: Body scanning. This is a mindfulness exercise where you pay attention to how different parts of your body feel. You can do this while lying down or sitting up. Sitting meditations. In this exercise, you focus on something like your breathing. When your mind starts to wander, gently bring it back to your breath. Keep doing this every time you notice your mind  wandering. Mindful movements. This exercise involves moving and stretching your body slowly while paying attention to how it feels. Mindful Tasks. This means paying attention to how your body feels while doing things like walking or eating. Follow these instructions at home:  Find an in-person MBSR program or find a program that is online. Find a podcast or recording that provides guidance for MSBR exercises. Look for a therapist who knows how to use MBSR. Follow your treatment plan as told by your health care provider. This may include taking regular medicines and making changes to your diet or lifestyle. Where to find more information You can find more information about MBSR from: Your provider. Community-based meditation centers or programs. American Psychological Association at http://forbes-duran.com/. This information is not intended to replace advice given to you by your health care provider. Make sure you discuss any questions you have with your health care provider. Document Revised: 07/21/2023 Document Reviewed: 07/21/2023 Elsevier Patient Education  2025 [employer].

## 2024-03-12 ENCOUNTER — Telehealth (HOSPITAL_BASED_OUTPATIENT_CLINIC_OR_DEPARTMENT_OTHER): Payer: Self-pay | Admitting: Family

## 2024-03-12 NOTE — Telephone Encounter (Signed)
 Returned a call back to the pts daughter [name] Mulch (on [place]) and informed her that Reche Finder, NP reviewed pts BP readings from the facility and was pleased with this.  Cheri aware that Caitlin recommends that the pt continue her coreg  25 mg bid and remain off of diltiazem.   [name] is aware that Reche would like for the facility to check her BP once per week and to let us  know if her BP is consistently more than 140/90.  Cheri states the facility checks her BP's several times a week and she will relay to them to reach out to the office if the pts BP is consistently running 140/90 and higher.   Cheri verbalized understanding and agrees with this plan.

## 2024-03-12 NOTE — Telephone Encounter (Signed)
 Average BP 131/76 based on last 7 readings from facility. This is at goal <140/90 which is great! Continue Carvedilol  25mg  twice daily. Recommend remain off Diltiazem at this time.   Would recommend her facility check BP at least once per week and if consistently more than 140/90 to contact cardiology.   Tiffannie Sloss S Jarrett Albor, NP

## 2024-03-14 ENCOUNTER — Ambulatory Visit: Admitting: Neurology

## 2024-04-03 ENCOUNTER — Ambulatory Visit: Admitting: Neurology

## 2024-04-18 NOTE — Progress Notes (Signed)
 ------------------------------------------------------------------------------- Attestation signed by Shanna JINNY Denmark, MD at 04/24/2024 10:10 AM I agree with the a/p, rjb  -------------------------------------------------------------------------------     GASTROENTEROLOGY OUTPATIENT CONSULTATION NOTE  REFERRING PHYSICIAN: Lavelle Pike, Sherrian*  CC:  trouble swallowing Chief Complaint  Patient presents with   GERD    COUGHING    HISTORY OF PRESENT ILLNESS: Jacqueline Foley is a 89 y.o. female from [facility] with a significant PMHx including dementia, GAD, depression, HTN, HLD, TIA, CKD, PVD, GERD, chronic cosnstipaton who presents to the office today for further evaluation of trouble swallowing and GERD. Their chart reviewed and we have never seen patient.    Patient is accompanied by her daughter Jacqueline Foley today.  Her daughter provides the history primarily. She states her mom has been noted to have issues with swallowing since about March of this year when she had a possible TIA.  She has had both an esophagram and a modified barium swallow since that time. The modified barium swallow noted mild oropharyngeal dysphagia along with mild esophageal dysphagia.  The daughter states her symptoms have been treated with mechanical soft diet and thin liquids.  She has also been treated for possible reflux with daily omeprazole .  She states her mom will have coughing fits that are random.  It occasionally can occur with eating and other times occurs just out of the blue.  She can cough sometimes to the point of throwing up but this has not happened recently.  She is concerned that she may be having GERD that is not controlled by her omeprazole  20 mg a day.  The head of her bed is elevated at night for potential nighttime symptoms.  The patient herself does not necessarily feel things get stuck in her chest.  Daughter would like her to have an EGD to make sure there is no uncontrolled  inflammation in the esophagus, potential hiatal hernias that she has been told she has this in the past or other treatment that should be implemented.  She states her weight has been stable since being in the rehab.  They are not interested in feeding tube due to this.    Review of EMR notes: 7/25 MBS IMPRESSION: No penetration or aspiration of barium observed.   Please refer to the Speech Pathologists report for complete details and recommendations.  ST IMPRESSION  Mild oropharyngeal dysphagia along with mild esophageal dysphagia characterized by prolonged and diminished mastication, prolonged oral prep time, and intermittent delay with swallow onset. Additionally, patient demonstrating decreased pharyngeal strength resulting in diffuse trace-to-min residue. Lastly, min statsis with retrograde moement in thoracic esophagus.    Likely patient is having dementia-related swallowing deficits, as patient's dtr reported patient having increased difficulty with managing own secretions. Likely patient is having trickle down penetration/aspiration of secretions, which would explain why patient is having intermittent, random bouts of coughing with or without any PO consumption.      RECOMMENDATIONS Diet: Dysphagia Soft & Bite Sized with Thin Liquids Aspiration precautions:  General precautions: sit upright, slow rate, fully swallow before next bite/sip, only eat when awake/alert, sit upright during all PO (as close to 90 degrees as possible) sit upright 30 minutes after eating Medications: one at a time, mixed whole, crushed, if permissible, with recommended consistencies, with puree   Additional Recommendations: Regular and thorough oral care, best practice is toothbrush and toothpaste Continue ST POC per SLP  5/25 ESOPHAGUS/BARIUM SWALLOW  FINDINGS:  Swallowing: Evidence of penetration.  No aspiration visualized.   Pharynx: Unremarkable.  Esophagus: Normal appearance.   Esophageal  motility: Within normal limits.   Hiatal Hernia: None.   Gastroesophageal reflux: None visualized.   Ingested 13mm barium tablet: Patient unable to swallow tablet.   Other: Study limited. Patient was unable to get in proper positions.   IMPRESSION:  *Single contrast esophagram with evidence of penetration without  frank aspiration. Overall limited study due to patient's inability  to get in proper position.     ALLERGIES: Allergies[1]  MEDICATIONS: Current Medications[2]  PAST MEDICAL HISTORY: Problem List[3]  PAST SURGICAL HISTORY: Surgical History[4]  SOCIAL HISTORY: Tobacco Use History[5] Social History   Substance and Sexual Activity  Alcohol Use None   Social History   Substance and Sexual Activity  Drug Use Not on file    FAMILY HISTORY: Family History[6]   REVIEW OF SYSTEMS: A complete ROS of negative except those stated in HPI.  LABS: Pertinent labs per HPI  IMAGING:  Pertinent GI imaging per HPI  VITAL SIGNS:       There is no height or weight on file to calculate BMI.  Vitals:   04/24/24 0843  BP: 130/80  Pulse: 78  Resp: 12  Temp: 97.8 F (36.6 C)  SpO2: 96%    PHYSICAL EXAM: Well developed, well nourished.  No acute distress.  In Wheelchair Skin warm and dry.  HEENT: Normocephalic, atraumatic. No scleral icterus.  No oropharyngeal lesions, mucosa pink and moist. Neck: Normal to inspection. Supple. Lungs:Respiratory effort unlabored. Clear to auscultation.  Cardiac: Rhythm regular with no murmur Abdomen:  Active bowel sounds,soft, nondistended, nontender  Extremities without  clubbing, cyanosis, edema.  Musculoskeletal:  No gross motor deficits. Neurological: Alert and oriented x 3. Mood and affect appropriate.   ASSESSMENT: 1. Gastroesophageal reflux disease, unspecified whether esophagitis present   2. Dysphagia, unspecified type    H/o this- on omeprazole  20mg /d in case contributing to sxs.  Noted to have  oropharyngeal dysphagia w/ fits best w/ history-mild esophageal dysphagia per MBS as well  Prior single column esophagram w/o cause for this  Long discussion w/ daughter Jacqueline Foley, that we are unable to correct oropharyngeal dysphagia which I feel is primary cause of sxs; will plan for egd to look for esophagitis, hernia Do not think more advance testing ph or manometry would likely be helpful D/w family PEG will not stop oropharyngeal dysphagia; maintaining wt  Orders Placed This Encounter  Procedures   Endo EGD    PLAN: Egd Continue daily omeprazole  20mg  Continue diet as recommended by speech tx Upright w/ meals; alternate sips and bites of solids   History of pacemaker or defibrillator: No Is BMI >45: No Does patient use O2: No Is patient on anticoagulation: No Is patient on GLP-1 receptor agonist: No  Preparation: NPO Endoscopist: Shanna DOROTHA Denmark, MD ASA: 3 Mallampati: 2 Anesthesia plan: Propofol  at [facility]  I discussed the nature of the recommended EGD , as well as the indications, risks, alternatives and potential complications including, but not limited to, bleeding, infection, reaction to medication, damage to internal organs, cardiac and/or pulmonary problems, and perforation requiring surgery (1 to 2 in 1000). The possibility that significant findings could be missed was explained. Any questions the patient had were answered. The patient gives consent for the procedure.  Thank you for allowing us  to participate in the care of this patient.        [1] Not on File [2]  Current Outpatient Medications:    albuterol  HFA (PROVENTIL  HFA;VENTOLIN  HFA;PROAIR  HFA)  90 mcg/actuation inhaler, Inhale 2 puffs every 6 (six) hours as needed for wheezing or shortness of breath., Disp: , Rfl:    atorvastatin  (LIPITOR) 20 mg tablet, Take 20 mg by mouth daily., Disp: , Rfl:    carvediloL  (COREG ) 25 mg tablet, Take 25 mg by mouth. with a meal, Disp: , Rfl:     furosemide (LASIX) 20 mg tablet, , Disp: , Rfl:    methocarbamoL  (ROBAXIN ) 500 mg tablet, Take 500 mg by mouth., Disp: , Rfl:    nitroglycerin  (NITROSTAT ) 0.4 mg SL tablet, Place 0.4 mg under the tongue., Disp: , Rfl:    OLANZapine  (ZyPREXA ) 7.5 mg tablet, Take 7.5 mg by mouth nightly., Disp: , Rfl:    omeprazole  (PriLOSEC) 20 mg DR capsule, Take 20 mg by mouth daily., Disp: , Rfl:    sertraline  (ZOLOFT ) 50 mg tablet, Take 50 mg by mouth daily., Disp: , Rfl:    traMADoL  (ULTRAM ) 50 mg tablet, Take 50 mg by mouth every 6 (six) hours as needed., Disp: , Rfl:    Advair Diskus 500-50 mcg/dose diskus inhaler, Inhale 1 puff., Disp: , Rfl:    cetirizine (ZyrTEC) 10 mg tablet, Take 10 mg by mouth daily., Disp: , Rfl:    escitalopram  (LEXAPRO ) 10 mg tablet, Take 10 mg by mouth every morning., Disp: , Rfl:    fluticasone  propionate (FLONASE ) 50 mcg/spray nasal spray, Administer 1 spray into affected nostril(s) daily., Disp: , Rfl:  [3] Patient Active Problem List Diagnosis   Dysphagia   Aortic atherosclerosis   Arthralgia of left knee   CKD (chronic kidney disease), stage III (CMD)   Dementia without behavioral disturbance (CMD)   Essential hypertension   Generalized anxiety disorder   GERD (gastroesophageal reflux disease)   Hyperlipidemia   Lumbar radiculopathy   Major depressive disorder, recurrent episode, moderate (CMD)   Neuroleptic-induced parkinsonism (CMD)   Post-poliomyelitis syndrome (CMD)   TIA (transient ischemic attack)   Reactive airway disease (CMD)   Oropharyngeal dysphagia  [4] History reviewed. No pertinent surgical history. [5] Social History Tobacco Use  Smoking Status Not on file  Smokeless Tobacco Not on file  [6] No family history on file.

## 2024-06-12 ENCOUNTER — Ambulatory Visit (HOSPITAL_BASED_OUTPATIENT_CLINIC_OR_DEPARTMENT_OTHER): Admitting: Cardiology

## 2024-06-13 ENCOUNTER — Ambulatory Visit (INDEPENDENT_AMBULATORY_CARE_PROVIDER_SITE_OTHER): Admitting: Cardiology

## 2024-06-13 ENCOUNTER — Encounter (HOSPITAL_BASED_OUTPATIENT_CLINIC_OR_DEPARTMENT_OTHER): Payer: Self-pay | Admitting: Cardiology

## 2024-06-13 VITALS — BP 118/62 | HR 85 | Ht 64.0 in | Wt 143.0 lb

## 2024-06-13 DIAGNOSIS — E7800 Pure hypercholesterolemia, unspecified: Secondary | ICD-10-CM

## 2024-06-13 DIAGNOSIS — I700 Atherosclerosis of aorta: Secondary | ICD-10-CM | POA: Diagnosis not present

## 2024-06-13 DIAGNOSIS — Z8673 Personal history of transient ischemic attack (TIA), and cerebral infarction without residual deficits: Secondary | ICD-10-CM | POA: Diagnosis not present

## 2024-06-13 DIAGNOSIS — I10 Essential (primary) hypertension: Secondary | ICD-10-CM

## 2024-06-13 NOTE — Progress Notes (Signed)
 " Cardiology Office Note:  .   Date:  06/13/2024  ID:  Jacqueline Foley, DOB [birth year], MRN [id number] PCP: Dayna Motto, DO  [facility] Providers Cardiologist:  Shelda Bruckner, MD {  History of Present Illness: .   Jacqueline Foley is a 89 y.o. female with a hx of atypical chest pain, diabetes type II, hypertension, GERD, TIA, HLD who is seen for follow up. I initially saw her as a new consult at the request of Cyrena Gwenn SQUIBB, MD for the evaluation and management of chest pain and recent hospital evaluation on 02/17/18.   Cardiac history: Was seen previously at [facility] (not in Care Everywhere)--worked at [employer] for 26 years, then moved to [place]  for 5-6 years ([employer], [employer]). She saw a cardiologist in [place]  in the [facility] main hospital at that time. She was put on lasix but unsure what other testing was done at the time. No cath. Thinks possibly an echo. Never told there were any issues with her heart. In 10/2017 developed intermittent left chest discomfort, pressure, both at rest and exertional, improved with NG. She presented to the ER and was admitted for observation. Troponins were negative x3, echo was normal, nuclear lexiscan  was low risk. CTPE negative (d-dimer was elevated).  Today: Here with daughter today. She is in nursing facility. Still recovering from hip fracture in early 2025, L foot in brace. LE edema has been better. She is largely in her wheelchair, can walk with walker but largely doesn't. Asking about more PT.   Had a bad cough for a while, viral panel was negative, is treating with cough syrup. Is also pending an endoscopy given swallowing/choking issues and history of GERD. Has restarted PPI. Sometimes food gets stuck, had to cough up food after eating this morning.  She isn't sure if she has had any chest pain or shortness of breath. Daughter doesn't recall hearing any concerns.  ROS: No PND, orthopnea, worsening LE  edema or unexpected weight gain. No syncope or palpitations.   Studies Reviewed: SABRA    EKG:       Physical Exam:   VS:  BP 118/62 (BP Location: Right Leg, Patient Position: Sitting, Cuff Size: Large)   Pulse 85   Ht 5' 4 (1.626 m)   Wt 143 lb (64.9 kg)   SpO2 97%   BMI 24.55 kg/m    Wt Readings from Last 3 Encounters:  06/13/24 143 lb (64.9 kg)  03/08/24 140 lb 3.2 oz (63.6 kg)  10/22/23 129 lb 6.6 oz (58.7 kg)    GEN: Well nourished, well developed in no acute distress HEENT: Normal, moist mucous membranes NECK: No JVD CARDIAC: regular rhythm, normal S1 and S2, no rubs or gallops. 1/6 systolic murmur RUSB. VASCULAR: Radial and DP pulses 2+ bilaterally. No carotid bruits RESPIRATORY:  Clear to auscultation without rales, wheezing or rhonchi  ABDOMEN: Soft, non-tender, non-distended MUSCULOSKELETAL:  in wheelchair today but moves all 4 limbs independently SKIN: Warm and dry, no edema NEUROLOGIC:  Alert and oriented x 3. No focal neuro deficits noted. PSYCHIATRIC:  Normal affect    ASSESSMENT AND PLAN: .    Hypertension: at goal today -continue carvedilol  25 mg BID   Aortic atherosclerosis, history of TIA:  -continue atorvastatin  20 mg daily. Last LDL 86, reasonable for age -we have discussed aspirin , but given age and potential GI concerns we elected to not start this.   Seizure vs. Syncope 03/2023 -echo and monitor unremarkable for  cardiac etiology  History of Chest pain:  -no recent events. Has treated with acid reducer in the past. More recently is having food get stuck, planned for endoscopy -stress test low risk -worse at night/laying in bed -counseled on red flag warning signs that need immediate medical attention  Dispo: 1 year or sooner as needed  Signed, Shelda Bruckner, MD   Shelda Bruckner, MD, PhD, [facility]  [facility]    [facility] & Vascular at [facility] [address]  [address] [place], [place] [address] [phone]   "

## 2024-06-13 NOTE — Patient Instructions (Signed)
 Medication Instructions:   Your physician recommends that you continue on your current medications as directed. Please refer to the Current Medication list given to you today.   *If you need a refill on your cardiac medications before your next appointment, please call your pharmacy*  Lab Work:  None ordered.  If you have labs (blood work) drawn today and your tests are completely normal, you will receive your results only by: MyChart Message (if you have MyChart) OR A paper copy in the mail If you have any lab test that is abnormal or we need to change your treatment, we will call you to review the results.  Testing/Procedures:  None ordered.  Follow-Up: At [facility], you and your health needs are our priority.  As part of our continuing mission to provide you with exceptional heart care, our providers are all part of one team.  This team includes your primary Cardiologist (physician) and Advanced Practice Providers or APPs (Physician Assistants and Nurse Practitioners) who all work together to provide you with the care you need, when you need it.  Your next appointment:   1 year(s)  Provider:   Shelda Bruckner, MD    We recommend signing up for the patient portal called MyChart.  Sign up information is provided on this After Visit Summary.  MyChart is used to connect with patients for Virtual Visits (Telemedicine).  Patients are able to view lab/test results, encounter notes, upcoming appointments, etc.  Non-urgent messages can be sent to your provider as well.   To learn more about what you can do with MyChart, go to [email or web address].   Other Instructions  Your physician wants you to follow-up in: 1 year.  You will receive a reminder letter in the mail two months in advance. If you don't receive a letter, please call our office to schedule the follow-up appointment.

## 2025-03-11 ENCOUNTER — Ambulatory Visit: Admitting: Neurology

## 2025-03-14 ENCOUNTER — Ambulatory Visit: Admitting: Neurology
